# Patient Record
Sex: Male | Born: 1950 | Race: White | Hispanic: No | Marital: Married | Smoking: Never smoker
Health system: Southern US, Community
[De-identification: ages and names within clinical notes are randomized; demographics above are authoritative.]

## PROBLEM LIST (undated history)

## (undated) DIAGNOSIS — E785 Hyperlipidemia, unspecified: Secondary | ICD-10-CM

## (undated) DIAGNOSIS — I351 Nonrheumatic aortic (valve) insufficiency: Secondary | ICD-10-CM

## (undated) DIAGNOSIS — I5189 Other ill-defined heart diseases: Secondary | ICD-10-CM

## (undated) DIAGNOSIS — N200 Calculus of kidney: Secondary | ICD-10-CM

## (undated) DIAGNOSIS — E291 Testicular hypofunction: Secondary | ICD-10-CM

## (undated) DIAGNOSIS — I10 Essential (primary) hypertension: Secondary | ICD-10-CM

## (undated) DIAGNOSIS — I4510 Unspecified right bundle-branch block: Secondary | ICD-10-CM

## (undated) DIAGNOSIS — N183 Chronic kidney disease, stage 3 unspecified: Secondary | ICD-10-CM

## (undated) HISTORY — DX: Calculus of kidney: N20.0

## (undated) HISTORY — DX: Testicular hypofunction: E29.1

## (undated) HISTORY — PX: MOHS SURGERY: SUR867

## (undated) HISTORY — PX: CHOLECYSTECTOMY: SHX55

## (undated) HISTORY — DX: Hyperlipidemia, unspecified: E78.5

---

## 1997-06-01 ENCOUNTER — Ambulatory Visit (HOSPITAL_COMMUNITY): Admission: RE | Admit: 1997-06-01 | Discharge: 1997-06-01 | Payer: Self-pay | Admitting: Family Medicine

## 1999-01-16 ENCOUNTER — Encounter: Payer: Self-pay | Admitting: Emergency Medicine

## 1999-01-16 ENCOUNTER — Emergency Department (HOSPITAL_COMMUNITY): Admission: EM | Admit: 1999-01-16 | Discharge: 1999-01-16 | Payer: Self-pay | Admitting: Emergency Medicine

## 2000-08-10 ENCOUNTER — Encounter (INDEPENDENT_AMBULATORY_CARE_PROVIDER_SITE_OTHER): Payer: Self-pay | Admitting: *Deleted

## 2000-08-10 ENCOUNTER — Ambulatory Visit (HOSPITAL_COMMUNITY): Admission: RE | Admit: 2000-08-10 | Discharge: 2000-08-10 | Payer: Self-pay | Admitting: Gastroenterology

## 2002-02-20 ENCOUNTER — Encounter: Payer: Self-pay | Admitting: Emergency Medicine

## 2002-02-20 ENCOUNTER — Emergency Department (HOSPITAL_COMMUNITY): Admission: EM | Admit: 2002-02-20 | Discharge: 2002-02-20 | Payer: Self-pay | Admitting: Emergency Medicine

## 2002-03-05 ENCOUNTER — Encounter: Payer: Self-pay | Admitting: Family Medicine

## 2002-03-05 ENCOUNTER — Ambulatory Visit (HOSPITAL_COMMUNITY): Admission: RE | Admit: 2002-03-05 | Discharge: 2002-03-05 | Payer: Self-pay | Admitting: Family Medicine

## 2004-01-14 ENCOUNTER — Emergency Department (HOSPITAL_COMMUNITY): Admission: EM | Admit: 2004-01-14 | Discharge: 2004-01-14 | Payer: Self-pay

## 2005-06-11 ENCOUNTER — Ambulatory Visit (HOSPITAL_COMMUNITY): Admission: RE | Admit: 2005-06-11 | Discharge: 2005-06-11 | Payer: Self-pay | Admitting: Surgery

## 2005-06-11 ENCOUNTER — Encounter (INDEPENDENT_AMBULATORY_CARE_PROVIDER_SITE_OTHER): Payer: Self-pay | Admitting: Specialist

## 2007-05-10 ENCOUNTER — Encounter: Admission: RE | Admit: 2007-05-10 | Discharge: 2007-05-25 | Payer: Self-pay | Admitting: Family Medicine

## 2007-08-17 ENCOUNTER — Ambulatory Visit (HOSPITAL_BASED_OUTPATIENT_CLINIC_OR_DEPARTMENT_OTHER): Admission: RE | Admit: 2007-08-17 | Discharge: 2007-08-17 | Payer: Self-pay | Admitting: Orthopedic Surgery

## 2010-06-04 NOTE — Op Note (Signed)
NAMEJUSTICE, MILLIRON            ACCOUNT NO.:  [id number]   MEDICAL RECORD NO.:  [id number]          PATIENT TYPE:  AMB   LOCATION:  DSC                          FACILITY:  [facility]   PHYSICIAN:  Katy Fitch. Sypher, M.D. 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   POSTOPERATIVE DIAGNOSIS:  Chronic stenosing tenosynovitis, right thumb  at A1 pulley with background palmar fibromatosis and mild contracture of  natatory ligament at first web.   OPERATION:  Release of right thumb A1 pulley and incidental dissection  of natatory ligaments at first web.   OPERATING SURGEON:  Katy Fitch. Sypher, MD.   ASSISTANT:  Marveen Reeks. Dasnoit, PA-C.   ANESTHESIA:  Lidocaine 2% local block of right thumb A1 pulley and  flexor sheath.   SUPERVISING ANESTHESIOLOGIST:  None.  This is a minor operating room  case.   ANESTHETIST:  Katy Fitch. 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 Leatrice Jewels for evaluation  and management of a chronic stenosing tenosynovitis of the right thumb.   Mr. Schaller was noted to have background Dupuytren contracture with  involvement of the pretendinous fibers to his right ring finger and a  contracture across the thumb index natatory ligament.  He had a severe  stenosing tenosynovitis of his right thumb flexor pollicis longus at the  A1 pulley with inability to extend the IP joint.  After informed  consent, he is brought to the operating room at this time anticipating  release of his A1 pulley.  We advised incidental release of the natatory  ligament to prevent a rapid progression of his Dupuytren palmar  fibromatosis.   After  informed consent, he is brought to the operating at this time.   PROCEDURE:  Atul Delucia is brought to the operating room and placed  in supine position on the operating table.   Following placement of 2% lidocaine block at metacarpal head level, the  right arm was prepped with Betadine soap solution, sterilely draped.  On  exsanguination of right arm with Esmarch bandage, the arterial  tourniquet was inflated to 230 mmHg.  Procedure commenced with a short  transverse incision extended towards the thumb at its web space to allow  partial release of the natatory ligament.   Subcutaneous tissues were carefully dissected.  There was a significant  increase in the amount of subcutaneous fibromatosis and would typically  be encountered with trigger release.  The natatory ligament was released  with scissors and the subcutaneous fascial bands were released with  scissors.  The A1 pulley was isolated.  The radial proper digital nerve  retracted with blunt retractor and a Glorious Peach used to clear the pulley.  The pulley was thickened to a wall diameter of more than 4 mm.   This was incised with scalpel and scissors.  The flexor pollicis longus  tendon was delivered and had a marked hourglass deformity were it had  been trapped beneath the pulley.   Thereafter, Mr. Annalee Genta was able to extend his IP joint to 30 degrees.  He ranged the IP joint.  We noted a partial rupture of his flexor  pollicis longus less than 10% of the diameter of the tendon.  The  margins of this degenerative tendon injury were debrided to a smooth  margin with a micro rongeur.   Thereafter, the wound was repaired with mattress suture of 4-0 nylon.  Compressive dressing was applied with Xeroflo sterile gauze and 1-inch  Coban.  There are no apparent complications.   Mr. Peterka was sent home with prescription for Vicodin 5 mg one p.o.  q. 4-6 h. p.r.n. pain 20 tablets without refill.  We will see him back  for  followup in the office a week for suture removal.  Questions  regarding this particularly invited and answered.  He was reminded that  his palmar fibromatosis may be simulated by an incision and be more  problematic in the next 6-12 months.   Questions were invited and answered in detail.      Katy Fitch Sypher, M.D.  Electronically Signed     RVS/MEDQ  D:  08/17/2007  T:  08/17/2007  Job:  [id number]   cc:   Vikki Ports, M.D.

## 2010-06-07 NOTE — Op Note (Signed)
[place]  Patient:    Daniel Aguirre, Daniel Aguirre                     MRN: [id number] Proc. Date: 08/10/00 Attending:  Petra Kuba, M.D. CC:         Jamesetta Geralds, M.D.   Operative Report  PROCEDURE PERFORMED:  Colonoscopy with biopsy.  ENDOSCOPIST:  Petra Kuba, M.D.  INDICATIONS FOR PROCEDURE:  Family history of colon cancer, colon polyps.  Due for screening.  Consent was signed after risks, benefits, methods, and options were thoroughly discussed in the office.  MEDICATIONS USED:  Demerol 60 mg, Versed 6 mg.  DESCRIPTION OF PROCEDURE:  Rectal inspection was pertinent for tiny external hemorrhoids.  Digital exam was negative.  Video pediatric colonoscope was inserted and easily advanced around the colon to the cecum.  This did not require any abdominal pressure or position changes. The cecum was identified by the appendiceal orifice and ileocecal valve.  The scope was slowly withdrawn.  The prep was adequate.  There was some liquid stool that required washing and suctioning.  On insertion, no obvious abnormality was seen.  On slow withdrawal through the colon, the cecum, ascending and transverse were all normal.  The scope was withdrawn around the left side.  Rare diverticula were seen.  There were three tiny polyps, questionable polyps seen in the descending, sigmoid and rectum, all of which were cold biopsied x 1 or 2 and put in the same container.  No other abnormalities were seen as we slowly withdrew back to the rectum.  Once back in the rectum, the scope was then retroflexed pertinent for some internal hemorrhoids small.  The scope was straightened and readvanced a short ways around the left side of the colon. Air was suctioned, scope removed.  The patient tolerated the procedure well. There was no obvious immediate complication.  ENDOSCOPIC DIAGNOSIS: 1. Tiny internal and external hemorrhoids. 2. Rare left-sided diverticula. 3.  Questionable three tiny polyps, rectal, sigmoid, descending, all cold    biopsied. 4. Otherwise within normal limits to the cecum.  PLAN:  Await pathology.  Would probably recheck colon screening in five years. Happy to see back p.r.n. Otherwise return care to Dr. Tery Sanfilippo for the customary yearly rectals and guaiacs. DD:  08/10/00 TD:  08/10/00 Job: [id number]

## 2010-06-07 NOTE — Op Note (Signed)
NAMEKOLSON, CHOVANEC            ACCOUNT NO.:  [id number]   MEDICAL RECORD NO.:  [id number]          PATIENT TYPE:  AMB   LOCATION:  SDS                          FACILITY:  [facility]   PHYSICIAN:  Wilmon Arms. Corliss Skains, M.D. DATE OF BIRTH:  1950/03/18   DATE OF PROCEDURE:  06/11/2005  DATE OF DISCHARGE:                                 OPERATIVE REPORT   PREOP DIAGNOSIS:  Chronic calculus cholecystitis.   POSTOP DIAGNOSIS:  Chronic calculus cholecystitis.   PROCEDURE PERFORMED:  Laparoscopic cholecystectomy with intraoperative  cholangiogram.   SURGEON:  Wilmon Arms. Corliss Skains, M.D.   ASSISTANT:  Leonie Man, M.D.   ANESTHESIA:  General endotracheal.   INDICATIONS:  The patient is a 60 year old male who presents with recent  episode of severe epigastric and right upper quadrant pain.  He was noted to  have gallstones and a mildly elevated bilirubin.  Since that time the  patient has been minimally symptomatic.  He presented for surgical  evaluation; and we recommended laparoscopic cholecystectomy.   DESCRIPTION OF PROCEDURE:  The patient was brought to the operating room and  placed in the supine position on the operating room table.  After an  adequate level of general endotracheal anesthesia was obtained, the  patient's abdomen was prepped with Betadine and draped in a sterile fashion.  Antibiotics had been given preoperatively; and pneumatic compression hose  were on his legs.  A time-out was taken to ensure the proper patient and  proper procedure.  A transverse incision was made just below his umbilicus.  Dissection was carried down through the subcutaneous tissues using blunt  dissection.  The fascia was grasped with a clamp and elevated.  The fascia  was opened vertically.  The peritoneal cavity was bluntly entered.  The stay  suture of #0 Vicryl was placed in pursestring fashion around the fascial  opening. The Hasson cannula was inserted; and secured with the stay suture.  The  pneumoperitoneum was then obtained by insufflating CO2 and maintaining  maximum pressure of 15 mmHg.   The camera was inserted; and the patient was rotated in reverse  Trendelenburg position.  The scope was inserted and a 10-mm port was placed  in the subxiphoid position.  Two 5-mm ports were placed in the right upper  quadrant.  The gallbladder had some adhesions to its surface.  These were  taken down with cautery.  The cystic duct was identified; and was  circumferentially dissected.  A clip was placed distally and a small opening  was created on the cystic duct.  A Cook cholangiogram catheter was then  inserted through a separate stab incision; and threaded into the cystic  duct.  A cholangiogram was obtained which showed good flow proximally and  distally showing both sides of the intrahepatic biliary tree.  There was  good flow distally into the duodenum.  No filling defects were noted.  The  cholangiogram catheter was then removed.   The cystic duct was ligated with clips and divided.  The cystic artery was  also ligated with clips and divided.  A separate posterior branch was  ligated with  clips.  Cautery was then used to remove the gallbladder from  the liver bed.  The gallbladder was placed in an EndoCatch sac.  The  gallbladder bed was then thoroughly irrigated.  Hemostasis was obtained with  cautery.  The EndoCatch sack, containing the gallbladder was then removed  through the umbilical port.  The ports were removed under direct vision, as  the pneumoperitoneum was released.  The fascial closure of the umbilicus was  then performed with the pursestring suture.  Then 4-0 Monocryl was used to  close the skin and subcuticular fascia.  Steri-Strips and clean dressing  applied.  The patient was extubated and brought to recovery in stable  condition.  All sponge, instrument, and needle counts were correct.      Wilmon Arms. Tsuei, M.D.  Electronically Signed     MKT/MEDQ  D:   06/11/2005  T:  06/11/2005  Job:  [id number]

## 2012-02-18 ENCOUNTER — Other Ambulatory Visit: Payer: Self-pay | Admitting: Surgery

## 2015-09-04 ENCOUNTER — Telehealth: Payer: Self-pay | Admitting: Cardiology

## 2015-09-04 NOTE — Telephone Encounter (Signed)
Received records from [facility] for appointment on 10/05/15 with Dr Antoine PocheHochrein.  [facility] for Dr Hochrein's schedule on 10/05/15.  lp

## 2015-10-04 NOTE — Progress Notes (Signed)
Cardiology Office Note   Date:  10/07/2015   ID:  Daniel SkatesDennis C Bearce, DOB 1950-07-10, MRN [id number]  PCP:   Duane Lopeoss, Alan, MD  Cardiologist:   Rollene RotundaJames Valli Randol, MD   Chief Complaint  Patient presents with  . Heart Murmur      History of Present Illness: Daniel Aguirre is a 65 y.o. male who presents for evaluation of a murmur. He says he's had a heart murmur for quite a while but he has not had any evaluation on this. He denies any current symptoms however. 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 With this high level of activity he denies any cardiac symptoms. He has no chest pressure, neck or arm discomfort. He does notice any palpitations, presyncope or syncope. He's had no PND or orthopnea.   Past Medical History:  Diagnosis Date  . Dyslipidemia   . Heart murmur   . Kidney stones   . Testicular hypofunction     Past Surgical History:  Procedure Laterality Date  . CHOLECYSTECTOMY    . MOHS SURGERY       Current Outpatient Prescriptions  Medication Sig Dispense Refill  . cholestyramine (QUESTRAN) 4 GM/DOSE powder TAKE 1 SCOOP 1-2 TIMES DAILY  12  . diltiazem (CARDIZEM CD) 300 MG 24 hr capsule Take 300 mg by mouth daily.  5  . Multiple Vitamin (MULTIVITAMIN WITH MINERALS) TABS tablet Take 1 tablet by mouth daily.    Marland Kitchen. testosterone (ANDROGEL) 50 MG/5GM (1%) GEL Place 1 g onto the skin daily.  5  . testosterone (TESTIM) 50 MG/5GM (1%) GEL Place 5 g onto the skin daily.     No current facility-administered medications for this visit.     Allergies:   Amlodipine; Lisinopril; and Penicillins    Social History:  The patient  reports that he has never smoked. He does not have any smokeless tobacco history on file.   Family History:  The patient's family history includes AAA (abdominal aortic aneurysm) (age of onset: 6455) in his father; Colon cancer in his mother; Heart attack (age of onset: 2572) in his  brother; Stroke in his father.    ROS:  Please see the history of present illness.   Otherwise, review of systems are positive for none.   All other systems are reviewed and negative.    PHYSICAL EXAM: VS:  BP (!) 160/80 (BP Location: Right Arm, Patient Position: Sitting, Cuff Size: Normal)   Pulse 66   Ht 5\' 8"  (1.727 m)   Wt 180 lb 6.4 oz (81.8 kg)   SpO2 99%   BMI 27.43 kg/m  , BMI Body mass index is 27.43 kg/m. GENERAL:  Well appearing HEENT:  Pupils equal round and reactive, fundi not visualized, oral mucosa unremarkable NECK:  No jugular venous distention, waveform within normal limits, carotid upstroke brisk and symmetric, no bruits, no thyromegaly LYMPHATICS:  No cervical, inguinal adenopathy LUNGS:  Clear to auscultation bilaterally BACK:  No CVA tenderness CHEST:  Unremarkable HEART:  PMI not displaced or sustained,S1 and S2 within normal limits, no S3, no S4, no clicks, no rubs, 3 out of 6 apical systolic murmur slightly increased with the strain phase of Valsalva, radiating slightly out the aortic outflow tract, no diastolic murmurs ABD:  Flat, positive bowel sounds normal in frequency in pitch, no bruits, no rebound, no guarding, no midline pulsatile mass, no hepatomegaly, no splenomegaly EXT:  2 plus pulses throughout, no  edema, no cyanosis no clubbing SKIN:  No rashes no nodules NEURO:  Cranial nerves II through XII grossly intact, motor grossly intact throughout PSYCH:  Cognitively intact, oriented to person place and time    EKG:  EKG is ordered today. The ekg ordered today demonstrates sinus rhythm, rate 66, right bundle branch block, left anterior fascicular block, left ventricular hypertrophy   Recent Labs: No results found for requested labs within last 8760 hours.    Lipid Panel No results found for: CHOL, TRIG, HDL, CHOLHDL, VLDL, LDLCALC, LDLDIRECT    Wt Readings from Last 3 Encounters:  10/05/15 180 lb 6.4 oz (81.8 kg)      Other studies  Reviewed: Additional studies/ records that were reviewed today include: Office records. Review of the above records demonstrates:  Please see elsewhere in the note.     ASSESSMENT AND PLAN:  MURMUR:  He does have a murmur with some dynamic component. I'll check an echocardiogram. Further evaluation will be based on this result.  QUESTION AAA:     the patient does have a questionable slight pulsatile aorta and first degree relative with abdominal aneurysm. He'll be screened.   RBBB:  I don't have an old EKG for comparison. He will get his echo. Likely he'll need no further testing.  HTN:   his blood pressure is slightly elevated. I gave him instruction blood pressure diary.   SLEEPINESS:   He does have daytime sleepiness is in a meeting but otherwise really doesn't have a lot of symptoms of sleep apnea. Has a little bit of snoring. If he has worsening daytime sleepiness he sleep study.   Current medicines are reviewed at length with the patient today.  The patient does not have concerns regarding medicines.  The following changes have been made:  no change  Labs/ tests ordered today include:   Orders Placed This Encounter  Procedures  . EKG 12-Lead  . ECHOCARDIOGRAM COMPLETE     Disposition:   FU with me as needed.      Signed, Rollene Rotunda, MD  10/07/2015 8:15 PM    [facility]

## 2015-10-05 ENCOUNTER — Ambulatory Visit (INDEPENDENT_AMBULATORY_CARE_PROVIDER_SITE_OTHER): Payer: 59 | Admitting: Cardiology

## 2015-10-05 ENCOUNTER — Encounter: Payer: Self-pay | Admitting: Cardiology

## 2015-10-05 ENCOUNTER — Encounter (INDEPENDENT_AMBULATORY_CARE_PROVIDER_SITE_OTHER): Payer: Self-pay

## 2015-10-05 VITALS — BP 160/80 | HR 66 | Ht 68.0 in | Wt 180.4 lb

## 2015-10-05 DIAGNOSIS — I714 Abdominal aortic aneurysm, without rupture, unspecified: Secondary | ICD-10-CM

## 2015-10-05 DIAGNOSIS — R011 Cardiac murmur, unspecified: Secondary | ICD-10-CM | POA: Diagnosis not present

## 2015-10-05 NOTE — Patient Instructions (Signed)
Medication Instructions:  Continue current medications  Labwork: None Ordered  Testing/Procedures: Your physician has requested that you have an echocardiogram. Echocardiography is a painless test that uses sound waves to create images of your heart. It provides your doctor with information about the size and shape of your heart and how well your heart's chambers and valves are working. This procedure takes approximately one hour. There are no restrictions for this procedure.  Follow-Up: Your physician recommends that you schedule a follow-up appointment in: As Needed   Any Other Special Instructions Will Be Listed Below (If Applicable).   If you need a refill on your cardiac medications before your next appointment, please call your pharmacy.   

## 2015-10-07 ENCOUNTER — Encounter: Payer: Self-pay | Admitting: Cardiology

## 2015-10-18 ENCOUNTER — Ambulatory Visit (HOSPITAL_COMMUNITY): Payer: 59 | Attending: Cardiology

## 2015-10-18 ENCOUNTER — Other Ambulatory Visit: Payer: Self-pay

## 2015-10-18 DIAGNOSIS — E785 Hyperlipidemia, unspecified: Secondary | ICD-10-CM | POA: Diagnosis not present

## 2015-10-18 DIAGNOSIS — I351 Nonrheumatic aortic (valve) insufficiency: Secondary | ICD-10-CM | POA: Insufficient documentation

## 2015-10-18 DIAGNOSIS — I77810 Thoracic aortic ectasia: Secondary | ICD-10-CM | POA: Insufficient documentation

## 2015-10-18 DIAGNOSIS — R011 Cardiac murmur, unspecified: Secondary | ICD-10-CM | POA: Diagnosis present

## 2015-10-18 DIAGNOSIS — I517 Cardiomegaly: Secondary | ICD-10-CM | POA: Diagnosis not present

## 2015-10-23 ENCOUNTER — Telehealth: Payer: Self-pay | Admitting: Cardiology

## 2015-10-23 NOTE — Telephone Encounter (Signed)
Pt aware of his echo

## 2015-10-23 NOTE — Telephone Encounter (Signed)
Returning your call. °

## 2017-12-20 ENCOUNTER — Other Ambulatory Visit: Payer: Self-pay

## 2017-12-20 ENCOUNTER — Emergency Department (HOSPITAL_BASED_OUTPATIENT_CLINIC_OR_DEPARTMENT_OTHER): Payer: No Typology Code available for payment source

## 2017-12-20 ENCOUNTER — Observation Stay (HOSPITAL_BASED_OUTPATIENT_CLINIC_OR_DEPARTMENT_OTHER)
Admission: EM | Admit: 2017-12-20 | Discharge: 2017-12-21 | Disposition: A | Discharge status: Home / Self Care | Payer: No Typology Code available for payment source | Attending: Internal Medicine | Admitting: Internal Medicine

## 2017-12-20 ENCOUNTER — Encounter (HOSPITAL_BASED_OUTPATIENT_CLINIC_OR_DEPARTMENT_OTHER): Payer: Self-pay | Admitting: Emergency Medicine

## 2017-12-20 DIAGNOSIS — I129 Hypertensive chronic kidney disease with stage 1 through stage 4 chronic kidney disease, or unspecified chronic kidney disease: Secondary | ICD-10-CM | POA: Insufficient documentation

## 2017-12-20 DIAGNOSIS — R0789 Other chest pain: Secondary | ICD-10-CM

## 2017-12-20 DIAGNOSIS — E785 Hyperlipidemia, unspecified: Secondary | ICD-10-CM | POA: Insufficient documentation

## 2017-12-20 DIAGNOSIS — I4891 Unspecified atrial fibrillation: Principal | ICD-10-CM | POA: Insufficient documentation

## 2017-12-20 DIAGNOSIS — R079 Chest pain, unspecified: Secondary | ICD-10-CM

## 2017-12-20 DIAGNOSIS — N183 Chronic kidney disease, stage 3 unspecified: Secondary | ICD-10-CM

## 2017-12-20 DIAGNOSIS — I10 Essential (primary) hypertension: Secondary | ICD-10-CM | POA: Diagnosis not present

## 2017-12-20 DIAGNOSIS — I248 Other forms of acute ischemic heart disease: Secondary | ICD-10-CM | POA: Diagnosis not present

## 2017-12-20 DIAGNOSIS — Z79899 Other long term (current) drug therapy: Secondary | ICD-10-CM | POA: Insufficient documentation

## 2017-12-20 DIAGNOSIS — N1831 Chronic kidney disease, stage 3a: Secondary | ICD-10-CM

## 2017-12-20 HISTORY — DX: Chronic kidney disease, stage 3 (moderate): N18.3

## 2017-12-20 HISTORY — DX: Chronic kidney disease, stage 3 unspecified: N18.30

## 2017-12-20 HISTORY — DX: Unspecified right bundle-branch block: I45.10

## 2017-12-20 HISTORY — DX: Other ill-defined heart diseases: I51.89

## 2017-12-20 HISTORY — DX: Nonrheumatic aortic (valve) insufficiency: I35.1

## 2017-12-20 HISTORY — DX: Essential (primary) hypertension: I10

## 2017-12-20 LAB — CBC
HCT: 49 % (ref 39.0–52.0)
Hemoglobin: 16.5 g/dL (ref 13.0–17.0)
MCH: 30.9 pg (ref 26.0–34.0)
MCHC: 33.7 g/dL (ref 30.0–36.0)
MCV: 91.8 fL (ref 80.0–100.0)
Platelets: 202 10*3/uL (ref 150–400)
RBC: 5.34 MIL/uL (ref 4.22–5.81)
RDW: 13.1 % (ref 11.5–15.5)
WBC: 7.7 10*3/uL (ref 4.0–10.5)
nRBC: 0 % (ref 0.0–0.2)

## 2017-12-20 LAB — BASIC METABOLIC PANEL
Anion gap: 7 (ref 5–15)
BUN: 21 mg/dL (ref 8–23)
CALCIUM: 9 mg/dL (ref 8.9–10.3)
CO2: 25 mmol/L (ref 22–32)
Chloride: 108 mmol/L (ref 98–111)
Creatinine, Ser: 1.63 mg/dL — ABNORMAL HIGH (ref 0.61–1.24)
GFR calc Af Amer: 50 mL/min — ABNORMAL LOW (ref >60)
GFR calc non Af Amer: 44 mL/min — ABNORMAL LOW (ref >60)
GLUCOSE: 114 mg/dL — AB (ref 70–99)
Potassium: 3.9 mmol/L (ref 3.5–5.1)
Sodium: 140 mmol/L (ref 135–145)

## 2017-12-20 LAB — TSH: TSH: 6.545 u[IU]/mL — ABNORMAL HIGH (ref 0.350–4.500)

## 2017-12-20 LAB — TROPONIN I
Troponin I: 0.04 ng/mL (ref <0.03)
Troponin I: 0.04 ng/mL (ref <0.03)

## 2017-12-20 LAB — MAGNESIUM: Magnesium: 2 mg/dL (ref 1.7–2.4)

## 2017-12-20 MED ORDER — ENOXAPARIN SODIUM 80 MG/0.8ML ~~LOC~~ SOLN
1.0000 mg/kg | Freq: Once | SUBCUTANEOUS | Status: AC
  Administered 2017-12-20: 80 mg via SUBCUTANEOUS
  Filled 2017-12-20: qty 0.8

## 2017-12-20 MED ORDER — HEPARIN (PORCINE) 25000 UT/250ML-% IV SOLN
1200.0000 [IU]/h | INTRAVENOUS | Status: DC
  Administered 2017-12-20: 1200 [IU]/h via INTRAVENOUS
  Filled 2017-12-20: qty 250

## 2017-12-20 MED ORDER — DILTIAZEM HCL 30 MG PO TABS
30.0000 mg | ORAL_TABLET | Freq: Once | ORAL | Status: AC
  Administered 2017-12-20: 30 mg via ORAL
  Filled 2017-12-20: qty 1

## 2017-12-20 MED ORDER — HEPARIN (PORCINE) 25000 UT/250ML-% IV SOLN
1000.0000 [IU]/h | INTRAVENOUS | Status: DC
  Administered 2017-12-20: 1100 [IU]/h via INTRAVENOUS
  Filled 2017-12-20: qty 250

## 2017-12-20 MED ORDER — ENOXAPARIN SODIUM 80 MG/0.8ML ~~LOC~~ SOLN: 1.0000 mg/kg | Freq: Once | SUBCUTANEOUS | Status: DC

## 2017-12-20 MED ORDER — DILTIAZEM HCL 30 MG PO TABS: 30.0000 mg | ORAL_TABLET | Freq: Once | ORAL | 0 refills | Status: DC | PRN

## 2017-12-20 MED ORDER — IOPAMIDOL (ISOVUE-370) INJECTION 76%
100.0000 mL | Freq: Once | INTRAVENOUS | Status: AC | PRN
  Administered 2017-12-20: 80 mL via INTRAVENOUS

## 2017-12-20 MED ORDER — METOPROLOL TARTRATE 12.5 MG HALF TABLET
12.5000 mg | ORAL_TABLET | Freq: Two times a day (BID) | ORAL | Status: DC
  Administered 2017-12-20 – 2017-12-21 (×2): 12.5 mg via ORAL
  Filled 2017-12-20 (×2): qty 1

## 2017-12-20 MED ORDER — DILTIAZEM HCL 100 MG IV SOLR
INTRAVENOUS | Status: AC
  Filled 2017-12-20: qty 100

## 2017-12-20 MED ORDER — HEPARIN BOLUS VIA INFUSION
4000.0000 [IU] | Freq: Once | INTRAVENOUS | Status: AC
  Administered 2017-12-20: 4000 [IU] via INTRAVENOUS

## 2017-12-20 MED ORDER — DILTIAZEM HCL 100 MG IV SOLR
5.0000 mg/h | INTRAVENOUS | Status: DC
  Administered 2017-12-20: 5 mg/h via INTRAVENOUS

## 2017-12-20 MED ORDER — RIVAROXABAN 15 MG PO TABS: 15.0000 mg | ORAL_TABLET | Freq: Every day | ORAL | 0 refills | Status: DC

## 2017-12-20 MED ORDER — DILTIAZEM LOAD VIA INFUSION
20.0000 mg | Freq: Once | INTRAVENOUS | Status: AC
  Administered 2017-12-20: 20 mg via INTRAVENOUS
  Filled 2017-12-20: qty 20

## 2017-12-20 MED ORDER — SODIUM CHLORIDE 0.9 % IV SOLN: INTRAVENOUS | Status: DC

## 2017-12-20 MED ORDER — DILTIAZEM HCL ER COATED BEADS 180 MG PO CP24
300.0000 mg | ORAL_CAPSULE | Freq: Every day | ORAL | Status: DC
  Administered 2017-12-20 – 2017-12-21 (×2): 300 mg via ORAL
  Filled 2017-12-20 (×2): qty 1

## 2017-12-20 MED ORDER — ASPIRIN EC 325 MG PO TBEC
325.0000 mg | DELAYED_RELEASE_TABLET | Freq: Every day | ORAL | Status: DC
  Administered 2017-12-20: 325 mg via ORAL
  Filled 2017-12-20: qty 1

## 2017-12-20 NOTE — ED Provider Notes (Addendum)
[facility] EMERGENCY DEPARTMENT Provider Note   CSN: [id number] Arrival date & time: 12/20/17  0534     History   Chief Complaint Chief Complaint  Patient presents with  . Atrial Fibrillation    HPI Daniel Aguirre is a 67 y.o. male.  HPI 67 year old male comes in with chief complaint of palpitations.  Patient has history of kidney stones, heart murmur, hypertension, hyperlipidemia and takes testosterone.  He reports that over the past few weeks he has had about couple of episodes of palpitations that were self resolving.  Last night he woke up with chest palpitations that have been persistent therefore patient decided to come to the ER.  There is no associated chest pain, shortness of breath, dizziness, near syncope.   Patient has no history of PE, DVT.  He takes testosterone as an ointment for the last 4 years.  No other risk factors for PE, DVT.  Patient denies any stimulant use and does not smoke.    Past Medical History:  Diagnosis Date  . Dyslipidemia   . Heart murmur   . Hypertension   . Kidney stones   . Testicular hypofunction     There are no active problems to display for this patient.   Past Surgical History:  Procedure Laterality Date  . CHOLECYSTECTOMY    . MOHS SURGERY          Home Medications    Prior to Admission medications   Medication Sig Start Date End Date Taking? Authorizing Provider  cholestyramine Lanetta Inch) 4 GM/DOSE powder TAKE 1 SCOOP 1-2 TIMES DAILY 07/11/15   [provider]  diltiazem (CARDIZEM CD) 300 MG 24 hr capsule Take 300 mg by mouth daily. 08/24/15   [provider]  diltiazem (CARDIZEM) 30 MG tablet Take 1 tablet (30 mg total) by mouth once as needed for up to 1 dose (palpitations with Heart rate > 100). 12/20/17   Derwood Kaplan, MD  Multiple Vitamin (MULTIVITAMIN WITH MINERALS) TABS tablet Take 1 tablet by mouth daily.    [provider]  Rivaroxaban (XARELTO) 15 MG TABS tablet  Take 1 tablet (15 mg total) by mouth daily with supper. 12/20/17   Derwood Kaplan, MD  testosterone (ANDROGEL) 50 MG/5GM (1%) GEL Place 1 g onto the skin daily. 08/27/15   [provider]  testosterone (TESTIM) 50 MG/5GM (1%) GEL Place 5 g onto the skin daily.    [provider]    Family History Family History  Problem Relation Age of Onset  . Colon cancer Mother   . AAA (abdominal aortic aneurysm) Father 62  . Stroke Father   . Heart attack Brother 39    Social History Social History   Tobacco Use  . Smoking status: Never Smoker  . Smokeless tobacco: Never Used  Substance Use Topics  . Alcohol use: Yes  . Drug use: Never     Allergies   Amlodipine; Lisinopril; and Penicillins   Review of Systems Review of Systems  Constitutional: Positive for activity change.  Respiratory: Negative for chest tightness and shortness of breath.   Cardiovascular: Positive for palpitations. Negative for chest pain.  Gastrointestinal: Negative for nausea and vomiting.  Neurological: Negative for syncope.  All other systems reviewed and are negative.    Physical Exam Updated Vital Signs BP (!) 119/93   Pulse (!) 48   Temp 98.3 F (36.8 C) (Oral)   Resp 15   Ht 5\' 8"  (1.727 m)   Wt 79.4 kg  SpO2 94%   BMI 26.61 kg/m   Physical Exam  Constitutional: He is oriented to person, place, and time. He appears well-developed.  HENT:  Head: Atraumatic.  Eyes: Pupils are equal, round, and reactive to light. EOM are normal.  Neck: Neck supple. No JVD present.  Cardiovascular:  Murmur heard. Irregularly irregular and tachycardia  Pulmonary/Chest: Effort normal. He has no wheezes. He has no rales.  Musculoskeletal: He exhibits no edema or tenderness.  Neurological: He is alert and oriented to person, place, and time.  Skin: Skin is warm.  Nursing note and vitals reviewed.    ED Treatments / Results  Labs (all labs ordered are listed, but only abnormal results are  displayed) Labs Reviewed  BASIC METABOLIC PANEL - Abnormal; Notable for the following components:      Result Value   Glucose, Bld 114 (*)    Creatinine, Ser 1.63 (*)    GFR calc non Af Amer 44 (*)    GFR calc Af Amer 50 (*)    All other components within normal limits  TROPONIN I - Abnormal; Notable for the following components:   Troponin I 0.04 (*)    All other components within normal limits  CBC  MAGNESIUM  TSH    EKG EKG Interpretation  Date/Time:  Sunday December 20 2017 05:41:22 EST Ventricular Rate:  108 PR Interval:    QRS Duration: 146 QT Interval:  345 QTC Calculation: 463 R Axis:   -33 Text Interpretation:  Atrial fibrillation Right bundle branch block Nonspecific ST and T wave abnormality new changes noted Confirmed by Derwood Kaplan ([id number]) on 12/20/2017 6:15:02 AM   Radiology Dg Chest Port 1 View  Result Date: 12/20/2017 CLINICAL DATA:  Awoke with pounding and chest and rapid heart rate. EXAM: PORTABLE CHEST 1 VIEW COMPARISON:  None. FINDINGS: The cardiomediastinal contours are normal. The lungs are clear. Pulmonary vasculature is normal. No consolidation, pleural effusion, or pneumothorax. No acute osseous abnormalities are seen. IMPRESSION: Negative AP view of the chest. Electronically Signed   By: Narda Rutherford M.D.   On: 12/20/2017 06:49    Procedures .Critical Care Performed by: Derwood Kaplan, MD Authorized by: Derwood Kaplan, MD   Critical care provider statement:    Critical care time (minutes):  45   Critical care start time:  12/20/2017 6:00 AM   Critical care end time:  12/20/2017 7:24 AM   Critical care was necessary to treat or prevent imminent or life-threatening deterioration of the following conditions:  Cardiac failure   Critical care was time spent personally by me on the following activities:  Discussions with consultants, evaluation of patient's response to treatment, examination of patient, ordering and performing treatments and  interventions, ordering and review of laboratory studies, ordering and review of radiographic studies, pulse oximetry, re-evaluation of patient's condition, obtaining history from patient or surrogate and review of old charts   (including critical care time)  Medications Ordered in ED Medications  diltiazem (CARDIZEM) 1 mg/mL load via infusion 20 mg (20 mg Intravenous Bolus from Bag 12/20/17 0615)    And  diltiazem (CARDIZEM) 100 mg in dextrose 5 % 100 mL (1 mg/mL) infusion (0 mg/hr Intravenous Paused 12/20/17 0659)  aspirin EC tablet 325 mg (325 mg Oral Given 12/20/17 0614)  enoxaparin (LOVENOX) injection 80 mg (has no administration in time range)  heparin bolus via infusion 4,000 Units (4,000 Units Intravenous Bolus from Bag 12/20/17 0626)  diltiazem (CARDIZEM) tablet 30 mg (30 mg Oral Given 12/20/17 0651)  Initial Impression / Assessment and Plan / ED Course  I have reviewed the triage vital signs and the nursing notes.  Pertinent labs & imaging results that were available during my care of the patient were reviewed by me and considered in my medical decision making (see chart for details).  Clinical Course as of Dec 21 723  Wynelle LinkSun Dec 20, 2017  [id number] Discussed case with Dr. Gilmore LarocheAKHTAR, cardiology. Since patient's heart rate has improved with IV Dilts bolus, he is comfortable with patient being managed as an outpatient.  He recommends that we give patient oral 30 mg diltiazem that he can take if needed.  He needs to continue taking his home dose of diltiazem for blood pressure. We will start patient on Xarelto this evening.  He will get a dose of Lovenox right now.  Heparin will be discontinued.  Repeat troponin pending.  Dr. Rubin PayorPickering will discharge the patient if his heart rate continues to be normal and he does not have rising troponin.   [AN]    Clinical Course User Index [AN] Derwood KaplanNanavati, Sharda Keddy, MD    67 year old male comes in with chief complaint of palpitations.  He is noted to be  new onset A. Fib.  Patient's heart rate is fluctuating between 110 and 140.  It appears that he has had similar episode of palpitations in the past that was self resolving.  Patient is already taking diltiazem for hypertension.  We will start him on diltiazem drip for rate control.  He is not appropriate for cardioversion.  It does not appear that there is underlying thyroid disease, PE or alcohol abuse, anemia, lung disease as a cause.  Patient does have a murmur which might be leading to A. Fib.  Patient is not decompensated at this time. Patient CHADSVASC score is 2 -we will initiate heparin. Anticipate admission.  Final Clinical Impressions(s) / ED Diagnoses   Final diagnoses:  Atrial fibrillation with RVR [facility](HCC)    ED Discharge Orders         Ordered    Amb referral to AFIB Clinic     12/20/17 0604    diltiazem (CARDIZEM) 30 MG tablet  Once PRN     12/20/17 0721    Rivaroxaban (XARELTO) 15 MG TABS tablet  Daily with supper     12/20/17 [id number]           Derwood KaplanNanavati, Jayjay Littles, MD 12/20/17 [id number]    Derwood KaplanNanavati, Taheem Fricke, MD 12/20/17 0725

## 2017-12-20 NOTE — ED Notes (Signed)
Date and time results received: 12/20/17 0956  Test: trp Critical Value:0.04  Name of Provider Notified: Rubin PayorPickering Orders Received? Or Actions Taken?: no orders given

## 2017-12-20 NOTE — Progress Notes (Signed)
ANTICOAGULATION CONSULT NOTE   Pharmacy Consult for heparin Indication: atrial fibrillation  Allergies  Allergen Reactions  . Amlodipine Palpitations  . Lisinopril Other (See Comments)    Elevated  creatinine   . Penicillins Other (See Comments)    Childhood reaction     Patient Measurements: Height: 5\' 8"  (172.7 cm) Weight: 175 lb (79.4 kg) IBW/kg (Calculated) : 68.4  Vital Signs: Temp: 98.2 F (36.8 C) (12/01 1538) Temp Source: Oral (12/01 1538) BP: 124/88 (12/01 1538) Pulse Rate: 86 (12/01 1538)  Medical History: Past Medical History:  Diagnosis Date  . CKD (chronic kidney disease), stage III (HCC)   . Diastolic dysfunction    a. 09/2015 Echo: EF 65-70%, no rwma, Gr1 DD, Mod AI. Asc AO 40mm. Mildly dil Asc Ao.  LA 43mm.  Marland Kitchen. Dyslipidemia   . Heart murmur   . Hypertension   . Kidney stones   . Moderate aortic insufficiency    a. 09/2015 Echo: Mod AI.  Marland Kitchen. Testicular hypofunction       Assessment: 67yo male awoke w/ CP and afib. He was on heparin then was changed to lovenox now to change back to heparin infusion (heparin IV stopped this am and lovenox 80mg  given at ~ 8am)   Goal of Therapy:  Heparin level 0.3-0.7 units/ml Monitor platelets by anticoagulation protocol: Yes   Plan:  -no heparin bolus -restart heparin at 1100 units/hr -Heparin level in 8 hours and daily wth CBC daily  Harland GermanAndrew Sathvik Tiedt, PharmD Clinical Pharmacist **Pharmacist phone directory can now be found on [email or web address] ([email or web address]).  Listed under [facility].

## 2017-12-20 NOTE — Progress Notes (Signed)
ANTICOAGULATION CONSULT NOTE - Initial Consult  Pharmacy Consult for heparin Indication: atrial fibrillation  Allergies  Allergen Reactions  . Amlodipine Palpitations  . Lisinopril Other (See Comments)    Elevated  creatinine   . Penicillins Other (See Comments)    Childhood reaction     Patient Measurements: Height: 5\' 8"  (172.7 cm) Weight: 175 lb (79.4 kg) IBW/kg (Calculated) : 68.4  Vital Signs: Temp: 98.3 F (36.8 C) (12/01 0544) Temp Source: Oral (12/01 0544) BP: 158/93 (12/01 0544) Pulse Rate: 97 (12/01 0544)  Medical History: Past Medical History:  Diagnosis Date  . Dyslipidemia   . Heart murmur   . Hypertension   . Kidney stones   . Testicular hypofunction     Medications:  Home meds: diltiazem, topical testosterone  Assessment: 67yo male awoke w/ pounding in chest and rapid HR, found to be in Afib, per last cards visit in Epic 5055yr ago no h/o Afib; to begin heparin.  Goal of Therapy:  Heparin level 0.3-0.7 units/ml Monitor platelets by anticoagulation protocol: Yes   Plan:  Will give heparin 4000 units IV bolus x1 followed by gtt at 1200 units/hr and monitor heparin levels and CBC  Vernard GamblesVeronda Blaklee Shores, PharmD, BCPS  12/20/2017,6:08 AM

## 2017-12-20 NOTE — ED Provider Notes (Addendum)
  Physical Exam  BP 140/88   Pulse 66   Temp 98.3 F (36.8 C) (Oral)   Resp 13   Ht 5\' 8"  (1.727 m)   Wt 79.4 kg   SpO2 98%   BMI 26.61 kg/m   Physical Exam  ED Course/Procedures   Clinical Course as of Dec 21 1130  Sun Dec 20, 2017  0722 Discussed case with Dr. Gilmore LarocheAKHTAR, cardiology. Since patient's heart rate has improved with IV Dilts bolus, he is comfortable with patient being managed as an outpatient.  He recommends that we give patient oral 30 mg diltiazem that he can take if needed.  He needs to continue taking his home dose of diltiazem for blood pressure. We will start patient on Xarelto this evening.  He will get a dose of Lovenox right now.  Heparin will be discontinued.  Repeat troponin pending.  Dr. Rubin PayorPickering will discharge the patient if his heart rate continues to be normal and he does not have rising troponin.   [AN]    Clinical Course User Index [AN] Derwood KaplanNanavati, Ankit, MD    Procedures  MDM  Received patient in signout.  New onset A. fib.  However likely has had episodes over the last month so not a candidate for immediate cardioversion.  Initial troponin mildly elevated.  Initial plan was to repeat but then developed more chest pain without his rate being is elevated.  Discussed with Dr. Anne FuSkains from cardiology.  Recommends medicine admission.  Discussed with Dr. Margo AyeHall from internal medicine.  [facility] Pain is now somewhat pleuritic with a tightness however and will get CT angiogram.      [name], [name], MD 12/20/17 [phone]   CT angiogram negative.  Patient is now converted back to a sinus rhythm.  Still has some chest tightness.    [name], [name], MD 12/20/17 [phone]

## 2017-12-20 NOTE — ED Triage Notes (Signed)
Pt states that at 0350 he woke up with a pounding in his chest and rapid heart rate. Pt reports several episode of this in the past few weeks.

## 2017-12-20 NOTE — H&P (Signed)
History and Physical    Daniel Aguirre WUJ:[id number] DOB: [birth year] DOA: 12/20/2017  PCP: Daisy Floro, MD   Patient coming from: [facility]  Chief Complaint: Palpitations  HPI: Daniel Aguirre is a 67 y.o. male with medical history significant of hypertension, hyperlipidemia who presented to med [facility] with complaints of intermittent racing of heart since a month. It was bothering him mostly at night. This morning it woke him up from the sleep and he could not bear any more so had to present to the emergency department. When he presented to the emergency department his heart rate was in the range of 140s.  EKG revealed A. fib with RVR.  Patient was never diagnosed with atrial fibrillation in the past.  He was noted to have heart murmur  as per his previous records.  Patient also developed some chest pain in the emergency department while he was there so CT chest was ordered which did not show any acute abnormalities. He described the pain as pressure on his central chest. He was started on Cardizem drip and was sent for admission here.  Cardiology has already been consulted. Patient seen and examined the bedside on the floor.  Currently his heart rate is controlled.  He was also on long-acting Cardizem at home.  Patient denies any chest pain, shortness of breath, abdominal pain, dysuria, nausea, vomiting, fever, chills, melena or hematochezia.During my evaluation he was in NSR and did not have any chest pain.  ED Course: Started on Cardizem drip with improvement in the heart rate.  Given a dose of therapeutic Lovenox for anti-coagulation.    Past Medical History:  Diagnosis Date  . Dyslipidemia   . Heart murmur   . Hypertension   . Kidney stones   . Renal insufficiency   . Testicular hypofunction     Past Surgical History:  Procedure Laterality Date  . CHOLECYSTECTOMY    . MOHS SURGERY       reports that he has never smoked. He has never used smokeless  tobacco. He reports that he drinks alcohol. He reports that he does not use drugs.  Allergies  Allergen Reactions  . Amlodipine Palpitations  . Lisinopril Other (See Comments)    Elevated  creatinine   . Penicillins Other (See Comments)    Childhood reaction     Family History  Problem Relation Age of Onset  . Colon cancer Mother   . AAA (abdominal aortic aneurysm) Father 50  . Stroke Father   . Heart attack Brother 72     Prior to Admission medications   Medication Sig Start Date End Date Taking? Authorizing Provider  cholestyramine Lanetta Inch) 4 GM/DOSE powder TAKE 1 SCOOP 1-2 TIMES DAILY 07/11/15   [provider]  diltiazem (CARDIZEM CD) 300 MG 24 hr capsule Take 300 mg by mouth daily. 08/24/15   [provider]  diltiazem (CARDIZEM) 30 MG tablet Take 1 tablet (30 mg total) by mouth once as needed for up to 1 dose (palpitations with Heart rate > 100). 12/20/17   Derwood Kaplan, MD  Multiple Vitamin (MULTIVITAMIN WITH MINERALS) TABS tablet Take 1 tablet by mouth daily.    [provider]  Rivaroxaban (XARELTO) 15 MG TABS tablet Take 1 tablet (15 mg total) by mouth daily with supper. 12/20/17   Derwood Kaplan, MD  testosterone (ANDROGEL) 50 MG/5GM (1%) GEL Place 1 g onto the skin daily. 08/27/15   [provider]  testosterone (TESTIM) 50 MG/5GM (1%) GEL  Place 5 g onto the skin daily.    [provider]    Physical Exam: Vitals:   12/20/17 1230 12/20/17 1245 12/20/17 1330 12/20/17 1400  BP: 132/90 128/85 138/90 120/82  Pulse: 72 68 82 72  Resp: 10 16 14 13   Temp:      TempSrc:      SpO2: 98% 96% 98% 94%  Weight:      Height:        Constitutional: NAD, calm, comfortable Vitals:   12/20/17 1230 12/20/17 1245 12/20/17 1330 12/20/17 1400  BP: 132/90 128/85 138/90 120/82  Pulse: 72 68 82 72  Resp: 10 16 14 13   Temp:      TempSrc:      SpO2: 98% 96% 98% 94%  Weight:      Height:       Eyes: PERRL, lids and conjunctivae  normal ENMT: Mucous membranes are moist. Posterior pharynx clear of any exudate or lesions.Normal dentition.  Neck: normal, supple, no masses, no thyromegaly Respiratory: clear to auscultation bilaterally, no wheezing, no crackles. Normal respiratory effort. No accessory muscle use.  Cardiovascular: Regular rate and rhythm, no murmurs / rubs / gallops. No extremity edema. 2+ pedal pulses. No carotid bruits.  Abdomen: no tenderness, no masses palpated. No hepatosplenomegaly. Bowel sounds positive.  Musculoskeletal: no clubbing / cyanosis. No joint deformity upper and lower extremities. Good ROM, no contractures. Normal muscle tone.  Skin: no rashes, lesions, ulcers. No induration Neurologic: CN 2-12 grossly intact. Sensation intact, DTR normal. Strength 5/5 in all 4.  Psychiatric: Normal judgment and insight. Alert and oriented x 3. Normal mood.   Foley Catheter:None  Labs on Admission: I have personally reviewed following labs and imaging studies  CBC: Recent Labs  Lab 12/20/17 0604  WBC 7.7  HGB 16.5  HCT 49.0  MCV 91.8  PLT 202   Basic Metabolic Panel: Recent Labs  Lab 12/20/17 0600 12/20/17 0604  NA  --  140  K  --  3.9  CL  --  108  CO2  --  25  GLUCOSE  --  114*  BUN  --  21  CREATININE  --  1.63*  CALCIUM  --  9.0  MG 2.0  --    GFR: Estimated Creatinine Clearance: 43.7 mL/min (A) (by C-G formula based on SCr of 1.63 mg/dL (H)). Liver Function Tests: No results for input(s): AST, ALT, ALKPHOS, BILITOT, PROT, ALBUMIN in the last 168 hours. No results for input(s): LIPASE, AMYLASE in the last 168 hours. No results for input(s): AMMONIA in the last 168 hours. Coagulation Profile: No results for input(s): INR, PROTIME in the last 168 hours. Cardiac Enzymes: Recent Labs  Lab 12/20/17 0604 12/20/17 0900  TROPONINI 0.04* 0.04*   BNP (last 3 results) No results for input(s): PROBNP in the last 8760 hours. HbA1C: No results for input(s): HGBA1C in the last 72  hours. CBG: No results for input(s): GLUCAP in the last 168 hours. Lipid Profile: No results for input(s): CHOL, HDL, LDLCALC, TRIG, CHOLHDL, LDLDIRECT in the last 72 hours. Thyroid Function Tests: Recent Labs    12/20/17 0621  TSH 6.545*   Anemia Panel: No results for input(s): VITAMINB12, FOLATE, FERRITIN, TIBC, IRON, RETICCTPCT in the last 72 hours. Urine analysis: No results found for: COLORURINE, APPEARANCEUR, LABSPEC, PHURINE, GLUCOSEU, HGBUR, BILIRUBINUR, KETONESUR, PROTEINUR, UROBILINOGEN, NITRITE, LEUKOCYTESUR  Radiological Exams on Admission: Ct Angio Chest Pe W And/or Wo Contrast  Result Date: 12/20/2017 CLINICAL DATA:  Chest pain and shortness  of breath. New onset atrial fibrillation. EXAM: CT ANGIOGRAPHY CHEST WITH CONTRAST TECHNIQUE: Multidetector CT imaging of the chest was performed using the standard protocol during bolus administration of intravenous contrast. Multiplanar CT image reconstructions and MIPs were obtained to evaluate the vascular anatomy. CONTRAST:  80mL ISOVUE-370 IOPAMIDOL (ISOVUE-370) INJECTION 76% COMPARISON:  Chest radiograph 12/20/2017. CT abdomen and pelvis 08/09/2009. FINDINGS: Cardiovascular: Pulmonary arterial opacification is adequate without evidence of emboli. The thoracic aorta is normal in caliber. The heart is normal in size. There is no pericardial effusion. Mediastinum/Nodes: No enlarged axillary, mediastinal, or hilar lymph nodes. Grossly unremarkable esophagus and thyroid. Lungs/Pleura: No pleural effusion or pneumothorax. Mild respiratory motion with minimal dependent atelectasis in the lower lobes. Similar appearance of small nodular densities in the right lower lobe compared to the prior abdominal CT with chronic calcification associated with the more inferior density (series 6, image 58). Upper Abdomen: Status post cholecystectomy. Punctate nonobstructing calculus in the upper pole of the left kidney. 2.1 cm left renal cyst. Musculoskeletal:  No acute osseous abnormality or suspicious osseous lesion. Review of the MIP images confirms the above findings. CLINICAL DATA:  Chest pain and shortness of breath. New onset atrial fibrillation. EXAM: CT ANGIOGRAPHY CHEST WITH CONTRAST TECHNIQUE: Multidetector CT imaging of the chest was performed using the standard protocol during bolus administration of intravenous contrast. Multiplanar CT image reconstructions and MIPs were obtained to evaluate the vascular anatomy. CONTRAST:  80mL ISOVUE-370 IOPAMIDOL (ISOVUE-370) INJECTION 76% COMPARISON:  Chest radiograph 12/20/2017. CT abdomen and pelvis 08/09/2009. FINDINGS: Cardiovascular: Pulmonary arterial opacification is adequate without evidence of emboli. The thoracic aorta is normal in caliber. The heart is normal in size. There is no pericardial effusion. Mediastinum/Nodes: No enlarged axillary, mediastinal, or hilar lymph nodes. Grossly unremarkable esophagus and thyroid. Lungs/Pleura: No pleural effusion or pneumothorax. Mild respiratory motion with minimal dependent atelectasis in the lower lobes. Similar appearance of small nodular densities in the right lower lobe compared to the prior abdominal CT with chronic calcification associated with the more inferior density (series 6, image 58). Upper Abdomen: Status post cholecystectomy. Punctate nonobstructing calculus in the upper pole of the left kidney. 2.1 cm left renal cyst. Musculoskeletal: No acute osseous abnormality or suspicious osseous lesion. Review of the MIP images confirms the above findings. IMPRESSION: No evidence of pulmonary emboli or other acute abnormality in the chest. Electronically Signed   By: Sebastian Ache M.D.   On: 12/20/2017 10:28   Dg Chest Port 1 View  Result Date: 12/20/2017 CLINICAL DATA:  Awoke with pounding and chest and rapid heart rate. EXAM: PORTABLE CHEST 1 VIEW COMPARISON:  None. FINDINGS: The cardiomediastinal contours are normal. The lungs are clear. Pulmonary vasculature  is normal. No consolidation, pleural effusion, or pneumothorax. No acute osseous abnormalities are seen. IMPRESSION: Negative AP view of the chest. Electronically Signed   By: Narda Rutherford M.D.   On: 12/20/2017 06:49     Assessment/Plan Principal Problem:   A-fib (HCC) Active Problems:   HTN (hypertension)   HLD (hyperlipidemia)  New onset A. fib with ZOX:WRUEAVWUJ score is 2.  Will benefit with anticoagulation.  He was given a dose of therapeutic Lovenox at [facility].  Cardiology has already been consulted.  We will get an echocardiogram.  I will wait  until cardiology sees him for choice of anticoagulation .  We will continue IV heparin until we start on oral anticoagulation.  Will not continue Lovenox given his creatinine clearance. He has been having symptoms of palpitations  on and off since 1 month so he may need to be an candidate for immediate cardioversion.  Will defer this decision to cardiology.  Abnormal TSH: TSH mildly elevated.  Could be subclinical hypothyroidism.We will check free T3 and T4.  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 Will check BMP tomorrow.  Chest pain: Atypical chest pain.  Most likely associated with palpitations.  CT chest did not reveal any acute abnormalities. Chest pain has resolved at this time.  Troponins very mildly elevated. Did not trend up.  Hypertension: Currently blood pressure stable.  He was on oral long-acting Cardizem at home.  We might resume the long-acting Cardizem after Cardizem drip is stopped completely.  Severity of Illness: The appropriate patient status for this patient is OBSERVATION.    DVT prophylaxis:Heparin Code Status: Full Family Communication: None present at the bedside Consults called: Cardiology     Burnadette PopAmrit Kyandra Mcclaine MD [facility] Pager [phone]  If 7PM-7AM, please contact night-coverage [email or web address] Password [email or web address]  12/20/2017, 3:35 PM

## 2017-12-20 NOTE — Care Management (Signed)
Patient has new onset atrial fibrillation, ED physician decided to watch patient for a while to decide whether patient needs to be discharged or admitted later.  Lorretta HarpXilin Skylan Gift, MD  [facility] Pager [phone]  If 7PM-7AM, please contact night-coverage [email or web address] Password [email or web address] 12/20/2017, 7:00 AM

## 2017-12-20 NOTE — Progress Notes (Signed)
Patient is a 67 year old male with past medical history significant for hypertension and sinus tachycardia who [facility] with complaints of pounding chest intermittently of 1 month duration.  This morning it woke him up out of his sleep.  He presented to the ED for further evaluation.  Upon presentation to the ED patient was found to have heart rate in the 140s twelve-lead EKG revealed A. fib with RVR.  ED physician spoke with cardiology who recommended admission by hospitalist.  [facility].  Patient will be admitted as observation status to telemetry unit.  Cardiology will be consulted.  Heart rate is stable at this time on Cardizem drip.

## 2017-12-20 NOTE — Consult Note (Addendum)
Cardiology Consult    Patient ID: Daniel Aguirre MRN: [id number], DOB/AGE: 1950-02-08   Admit date: 12/20/2017 Date of Consult: 12/20/2017  Primary Physician: Daisy Floro, MD Primary Cardiologist: Rollene Rotunda, MD Requesting Provider: Dr. Joesph July  Patient Profile    Daniel Aguirre is a 67 y.o. male with a history of stage III chronic kidney disease, hypertension, diastolic dysfunction, aortic insufficiency, and hyperlipidemia, who is being seen today for the evaluation of atrial fibrillation and chest pain with mild troponin elevation at the request of Dr. Renford Dills.  Past Medical History   Past Medical History:  Diagnosis Date  . CKD (chronic kidney disease), stage III (HCC)   . Diastolic dysfunction    a. 09/2015 Echo: EF 65-70%, no rwma, Gr1 DD, Mod AI. Asc AO 40mm. Mildly dil Asc Ao.  LA 43mm.  Marland Kitchen Dyslipidemia   . Heart murmur   . Hypertension   . Kidney stones   . Moderate aortic insufficiency    a. 09/2015 Echo: Mod AI.  Marland Kitchen Right bundle branch block   . Testicular hypofunction     Past Surgical History:  Procedure Laterality Date  . CHOLECYSTECTOMY    . MOHS SURGERY       Allergies  Allergies  Allergen Reactions  . Amlodipine Palpitations  . Lisinopril Other (See Comments)    Elevated  creatinine   . Penicillins Other (See Comments)    Childhood reaction     History of Present Illness    67 year old male with the above past medical history including stage III chronic kidney disease, hypertension times a proximally 4 to 5 years, cardiac murmur with moderate aortic insufficiency by echo in 2017, remote nephrolithiasis, and hyperlipidemia.  He lives locally with his wife is an active gentleman, exercising approximately 3 days a week.  He has a long history of a cardiac murmur and was previously evaluated by Dr. Antoine Poche in 2017.  Echocardiogram at that time showed moderate aortic insufficiency and a mildly dilated a sending aorta-40 mm.   Recommendation was made for follow-up in 1 year however, patient says he did not receive that message.  He is remained healthy and feeling well over the past 2 years but approximately 5 to 7 weeks ago, he began to experience intermittent palpitations, typically occurring mid afternoon and then again in the early morning hours, lasting approximately 30 minutes, and resolving spontaneously.  He has not previously experienced chest pain, dyspnea, or presyncope with tachypalpitations.  Early this morning, he awoke with recurrent tachypalpitations.  He got up and checked his blood pressure and this was lower than usual, at 102 systolic.  His heart rate was 106.  He took out his dogs while standing outside, he felt lightheaded.  As result, he alerted his wife and subsequently presented to the med [facility].  There, he was found to be in atrial fibrillation at a rate of 108 with a right bundle branch block (old).  He reported substernal chest discomfort that was worse with taking a deep breath.  He was given a Lovenox subcutaneous injection and given a dose of 30 mg oral diltiazem in addition to his usual home dose of long-acting diltiazem, which he has been on for about 4 years for his blood pressure.  Diltiazem infusion was also started.  CT angiography of the chest was undertaken and was negative for PE.  Troponin was mildly elevated at 0.04.  He subsequently converted to sinus rhythm and intravenous diltiazem was discontinued.  [facility] in the setting of mild troponin elevation.  He is chest pain-free since conversion to sinus rhythm.  Inpatient Medications    Heparin infusion ordered Diltiazem 300 mg daily Received diltiazem 30 mg p.o. x1  Family History    Family History  Problem Relation Age of Onset  . Colon cancer Mother 6077  . AAA (abdominal aortic aneurysm) Father 4575  . Stroke Father   . Heart attack Brother 72       presumed MI - SCD  . Stroke Brother [id number]   He  indicated that his mother is deceased. He indicated that his father is deceased. He indicated that his sister is alive. He indicated that only one of his three brothers is alive. He indicated that his maternal grandmother is deceased. He indicated that his maternal grandfather is deceased. He indicated that his paternal grandmother is deceased. He indicated that his paternal grandfather is deceased.   Social History    Social History   Socioeconomic History  . Marital status: Married    Spouse name: Not on file  . Number of children: 2  . Years of education: Not on file  . Highest education level: Not on file  Occupational History  . Occupation: Cabin crewolice (retired)  Social Needs  . Financial resource strain: Not on file  . Food insecurity:    Worry: Not on file    Inability: Not on file  . Transportation needs:    Medical: Not on file    Non-medical: Not on file  Tobacco Use  . Smoking status: Never Smoker  . Smokeless tobacco: Never Used  Substance and Sexual Activity  . Alcohol use: Yes  . Drug use: Never  . Sexual activity: Yes  Lifestyle  . Physical activity:    Days per week: Not on file    Minutes per session: Not on file  . Stress: Not on file  Relationships  . Social connections:    Talks on phone: Not on file    Gets together: Not on file    Attends religious service: Not on file    Active member of club or organization: Not on file    Attends meetings of clubs or organizations: Not on file    Relationship status: Not on file  . Intimate partner violence:    Fear of current or ex partner: Not on file    Emotionally abused: Not on file    Physically abused: Not on file    Forced sexual activity: Not on file  Other Topics Concern  . Not on file  Social History Narrative  . Not on file     Review of Systems    General:  No chills, fever, night sweats or weight changes.  Cardiovascular:  +++ chest pain - pleuritic component, no dyspnea on exertion, edema,  orthopnea, +++ palpitations, no paroxysmal nocturnal dyspnea. Dermatological: No rash, lesions/masses Respiratory: No cough, dyspnea Urologic: No hematuria, dysuria Abdominal:   No nausea, vomiting, diarrhea, bright red blood per rectum, melena, or hematemesis Neurologic:  No visual changes, wkns, changes in mental status. All other systems reviewed and are otherwise negative except as noted above.  Physical Exam    Blood pressure 124/88, pulse 86, temperature 98.2 F (36.8 C), temperature source Oral, resp. rate 16, height 5\' 8"  (1.727 m), weight 79.4 kg, SpO2 95 %.  General: Pleasant, NAD Psych: Normal affect. Neuro: Alert and oriented X 3. Moves all extremities spontaneously. HEENT: Normal  Neck: Supple  without bruits or JVD. Lungs:  Resp regular and unlabored, CTA. Heart: RRR no s3, s4.  2/6 systolic murmur at the upper sternal borders with 3/6 systolic murmur at the apex.  I do not appreciate a diastolic murmur. Abdomen: Soft, non-tender, non-distended, BS + x 4.  Extremities: No clubbing, cyanosis or edema. DP/PT/Radials 2+ and equal bilaterally.  Labs     Recent Labs    12/20/17 0604 12/20/17 0900  TROPONINI 0.04* 0.04*   Lab Results  Component Value Date   WBC 7.7 12/20/2017   HGB 16.5 12/20/2017   HCT 49.0 12/20/2017   MCV 91.8 12/20/2017   PLT 202 12/20/2017    Recent Labs  Lab 12/20/17 0604  NA 140  K 3.9  CL 108  CO2 25  BUN 21  CREATININE 1.63*  CALCIUM 9.0  GLUCOSE 114*      Radiology Studies    Ct Angio Chest Pe W And/or Wo Contrast  Result Date: 12/20/2017 CLINICAL DATA:  Chest pain and shortness of breath. New onset atrial fibrillation. EXAM: CT ANGIOGRAPHY CHEST WITH CONTRAST TECHNIQUE: Multidetector CT imaging of the chest was performed using the standard protocol during bolus administration of intravenous contrast. Multiplanar CT image reconstructions and MIPs were obtained to evaluate the vascular anatomy. CONTRAST:  80mL ISOVUE-370  IOPAMIDOL (ISOVUE-370) INJECTION 76% COMPARISON:  Chest radiograph 12/20/2017. CT abdomen and pelvis 08/09/2009. FINDINGS: Cardiovascular: Pulmonary arterial opacification is adequate without evidence of emboli. The thoracic aorta is normal in caliber. The heart is normal in size. There is no pericardial effusion. Mediastinum/Nodes: No enlarged axillary, mediastinal, or hilar lymph nodes. Grossly unremarkable esophagus and thyroid. Lungs/Pleura: No pleural effusion or pneumothorax. Mild respiratory motion with minimal dependent atelectasis in the lower lobes. Similar appearance of small nodular densities in the right lower lobe compared to the prior abdominal CT with chronic calcification associated with the more inferior density (series 6, image 58). Upper Abdomen: Status post cholecystectomy. Punctate nonobstructing calculus in the upper pole of the left kidney. 2.1 cm left renal cyst. Musculoskeletal: No acute osseous abnormality or suspicious osseous lesion. Review of the MIP images confirms the above findings. CLINICAL DATA:  Chest pain and shortness of breath. New onset atrial fibrillation. EXAM: CT ANGIOGRAPHY CHEST WITH CONTRAST TECHNIQUE: Multidetector CT imaging of the chest was performed using the standard protocol during bolus administration of intravenous contrast. Multiplanar CT image reconstructions and MIPs were obtained to evaluate the vascular anatomy. CONTRAST:  80mL ISOVUE-370 IOPAMIDOL (ISOVUE-370) INJECTION 76% COMPARISON:  Chest radiograph 12/20/2017. CT abdomen and pelvis 08/09/2009. FINDINGS: Cardiovascular: Pulmonary arterial opacification is adequate without evidence of emboli. The thoracic aorta is normal in caliber. The heart is normal in size. There is no pericardial effusion. Mediastinum/Nodes: No enlarged axillary, mediastinal, or hilar lymph nodes. Grossly unremarkable esophagus and thyroid. Lungs/Pleura: No pleural effusion or pneumothorax. Mild respiratory motion with minimal  dependent atelectasis in the lower lobes. Similar appearance of small nodular densities in the right lower lobe compared to the prior abdominal CT with chronic calcification associated with the more inferior density (series 6, image 58). Upper Abdomen: Status post cholecystectomy. Punctate nonobstructing calculus in the upper pole of the left kidney. 2.1 cm left renal cyst. Musculoskeletal: No acute osseous abnormality or suspicious osseous lesion. Review of the MIP images confirms the above findings. IMPRESSION: No evidence of pulmonary emboli or other acute abnormality in the chest. Electronically Signed   By: Sebastian Ache M.D.   On: 12/20/2017 10:28   Dg Chest [facility] 1 [address]  Result Date: 12/20/2017 CLINICAL DATA:  Awoke with pounding and chest and rapid heart rate. EXAM: PORTABLE CHEST 1 VIEW COMPARISON:  None. FINDINGS: The cardiomediastinal contours are normal. The lungs are clear. Pulmonary vasculature is normal. No consolidation, pleural effusion, or pneumothorax. No acute osseous abnormalities are seen. IMPRESSION: Negative AP view of the chest. Electronically Signed   By: Narda Rutherford M.D.   On: 12/20/2017 06:49    ECG & Cardiac Imaging    Atrial fibrillation, 108, right bundle branch block, left axis - personally reviewed.  Assessment & Plan    1.  Paroxysmal atrial fibrillation: Patient presented to the med Center at [facility] today with a 5 to 7-week history of intermittent tachypalpitations occurring multiple times per week lasting up to 30 minutes and resolving spontaneously.  This morning, he had more persistent symptoms associated with lightheadedness, prompting his presentation.  He was found to be in atrial fibrillation at a rate of 108.  He also had substernal and pleuritic chest discomfort.  CTA of the chest was negative for PE.  He was treated with oral and also intravenous diltiazem with subsequent conversion to sinus rhythm.  He remains in sinus rhythm currently.  CHA2DS2VASc  equals 2.  Troponin was mildly elevated at 0.04.  Suspect demand ischemia.  Electrolytes within normal limits.  TSH mildly elevated at 6.545.  For the time being, agree with IV heparinization so that we can obtain echocardiogram and evaluate LV function.  Provided that LV function is normal and troponin trend remains flat, would recommend switching over to Eliquis.  Continue home dose of diltiazem in the setting of mild troponin elevation, I will add a low-dose beta-blocker.  Watch heart rates.  2.  Elevated troponin: Likely demand ischemia in the setting of rapid A. fib.  He did have some chest discomfort.  CT of the chest was negative for PE.  In the setting of intravenous contrast, difficult to ascertain whether or not coronary calcium present.  Provided that echo shows normal LV function without wall motion abnormalities, he will likely be okay for discharge tomorrow we can arrange for an outpatient exercise Myoview to assess for ischemia.  Adding low-dose beta-blocker as above.  Evaluate lipids and have a low threshold to add statin.  3.  Stage III chronic kidney disease: He is unaware of what his baseline creatinine is but says he has been told he has stage III.  Creatinine clearance 43.7 today with a creatinine of 1.63.  He did receive contrast in the setting of a CTA to rule out PE.  Follow-up creatinine in the a.m.  Despite CKD 3, he would still be appropriate for Eliquis 5 mg twice daily in the setting of #1.  4.  Essential hypertension: He says blood pressures typically run in the 140s at home.  Currently 124/88.  Continue oral diltiazem and will add beta-blocker.  5.  Hyperlipidemia: Follow-up lipids.  Not on statin at home.  6.  Abnl TSH:  Mildly elevated.  ? Sick euthyroid. F/u FT4.  Signed, Nicolasa Ducking, NP 12/20/2017, 5:03 PM  For questions or updates, please contact   Please consult [email or web address] for contact info under Cardiology/STEMI.  67 year old male with h/o  hyperlipidemia, CKD stage III, chronic right bundle branch block, who presented with palpitations that has been going on and off for about 3 months, this morning they were persistent and associated with chest tightness and dizziness.   He was found to be in atrial fibrillation, and started on Cardizem  drip that spontaneously cardioverted him back to sinus rhythm at 10:30 AM.  He is being started on heparin drip, and later will be switched to Eliquis 5 mg p.o. twice daily.  The patient understands the risk and benefits and agrees with that. CHA2DS2VASc equals 2.  Troponin was mildly elevated at 0.04.  Suspect demand ischemia. Since he already got the chest CT with contrast to rule out pulmonary embolism today, and has CKD stage III, we will be able to place coronary CTA but will instead plan for outpatient exercise treadmill stress test. Continue diltiazem and metoprolol.  Tobias Alexander, MD 12/20/2017

## 2017-12-21 ENCOUNTER — Observation Stay (HOSPITAL_BASED_OUTPATIENT_CLINIC_OR_DEPARTMENT_OTHER): Payer: No Typology Code available for payment source

## 2017-12-21 ENCOUNTER — Other Ambulatory Visit: Payer: Self-pay | Admitting: Cardiology

## 2017-12-21 DIAGNOSIS — R0789 Other chest pain: Secondary | ICD-10-CM | POA: Diagnosis not present

## 2017-12-21 DIAGNOSIS — I480 Paroxysmal atrial fibrillation: Secondary | ICD-10-CM

## 2017-12-21 DIAGNOSIS — N183 Chronic kidney disease, stage 3 (moderate): Secondary | ICD-10-CM | POA: Diagnosis not present

## 2017-12-21 DIAGNOSIS — I4891 Unspecified atrial fibrillation: Secondary | ICD-10-CM

## 2017-12-21 DIAGNOSIS — I208 Other forms of angina pectoris: Secondary | ICD-10-CM

## 2017-12-21 DIAGNOSIS — I10 Essential (primary) hypertension: Secondary | ICD-10-CM | POA: Diagnosis not present

## 2017-12-21 LAB — BASIC METABOLIC PANEL
ANION GAP: 5 (ref 5–15)
BUN: 19 mg/dL (ref 8–23)
CO2: 26 mmol/L (ref 22–32)
Calcium: 8.2 mg/dL — ABNORMAL LOW (ref 8.9–10.3)
Chloride: 101 mmol/L (ref 98–111)
Creatinine, Ser: 1.39 mg/dL — ABNORMAL HIGH (ref 0.61–1.24)
GFR calc Af Amer: >60 mL/min (ref >60)
GFR, EST NON AFRICAN AMERICAN: 53 mL/min — AB (ref >60)
Glucose, Bld: 119 mg/dL — ABNORMAL HIGH (ref 70–99)
Potassium: 4 mmol/L (ref 3.5–5.1)
Sodium: 132 mmol/L — ABNORMAL LOW (ref 135–145)

## 2017-12-21 LAB — CBC
HCT: 47.8 % (ref 39.0–52.0)
Hemoglobin: 15.7 g/dL (ref 13.0–17.0)
MCH: 29.7 pg (ref 26.0–34.0)
MCHC: 32.8 g/dL (ref 30.0–36.0)
MCV: 90.5 fL (ref 80.0–100.0)
Platelets: 187 10*3/uL (ref 150–400)
RBC: 5.28 MIL/uL (ref 4.22–5.81)
RDW: 13 % (ref 11.5–15.5)
WBC: 6.5 10*3/uL (ref 4.0–10.5)
nRBC: 0 % (ref 0.0–0.2)

## 2017-12-21 LAB — HEPARIN LEVEL (UNFRACTIONATED)
HEPARIN UNFRACTIONATED: 0.72 [IU]/mL — AB (ref 0.30–0.70)
Heparin Unfractionated: 0.54 IU/mL (ref 0.30–0.70)

## 2017-12-21 LAB — ECHOCARDIOGRAM COMPLETE
Height: 68 in
Weight: 2854.4 oz

## 2017-12-21 LAB — HIV ANTIBODY (ROUTINE TESTING W REFLEX): HIV Screen 4th Generation wRfx: NONREACTIVE

## 2017-12-21 LAB — T4, FREE: Free T4: 0.92 ng/dL (ref 0.82–1.77)

## 2017-12-21 MED ORDER — APIXABAN 5 MG PO TABS
5.0000 mg | ORAL_TABLET | Freq: Two times a day (BID) | ORAL | Status: DC
  Administered 2017-12-21: 5 mg via ORAL
  Filled 2017-12-21: qty 1

## 2017-12-21 MED ORDER — APIXABAN 5 MG PO TABS: 5.0000 mg | ORAL_TABLET | Freq: Two times a day (BID) | ORAL | 0 refills | Status: DC

## 2017-12-21 MED ORDER — DILTIAZEM HCL ER COATED BEADS 240 MG PO CP24: 240.0000 mg | ORAL_CAPSULE | Freq: Every day | ORAL | Status: DC

## 2017-12-21 MED ORDER — OFF THE BEAT BOOK
Freq: Once | Status: AC
  Administered 2017-12-21: 10:00:00
  Filled 2017-12-21: qty 1

## 2017-12-21 MED ORDER — METOPROLOL TARTRATE 25 MG PO TABS: 12.5000 mg | ORAL_TABLET | Freq: Two times a day (BID) | ORAL | 0 refills | Status: DC

## 2017-12-21 MED ORDER — DILTIAZEM HCL ER COATED BEADS 240 MG PO CP24: 240.0000 mg | ORAL_CAPSULE | Freq: Every day | ORAL | 0 refills | Status: DC

## 2017-12-21 NOTE — Discharge Summary (Signed)
Physician Discharge Summary  Daniel Aguirre JYN:[id number] DOB: [birth year] DOA: 12/20/2017  PCP: Daisy Floro, MD  Admit date: 12/20/2017 Discharge date: 12/21/2017  Admitted From: Home Disposition:  Home  Discharge Condition:Stable CODE STATUS:FULL Diet recommendation: Heart Healthy   Brief/Interim Summary:  Daniel Aguirre is a 67 y.o. male with medical history significant of hypertension, hyperlipidemia who presented to med [facility] with complaints of intermittent racing of heart since a month. It was bothering him mostly at night. This morning it woke him up from the sleep and he could not bear any more so had to present to the emergency department. When he presented to the emergency department his heart rate was in the range of 140s.  EKG revealed A. fib with RVR.  Patient was never diagnosed with atrial fibrillation in the past. He was started on Cardizem drip and was sent for admission here.  Cardiology consulted. Currently his heart rate is controlled.His Echo showed normal left ventricular systolic function, no regional wall motion abnormality.He was started on Eliquis and stable for discharge home today .He will follow-up with cardiology as an outpatient .  Following problems were addressed during his hospitalization:  New onset A. fib with QMV:HQIONGEXB score is 2.  Started on Eliquis.  Also started on metoprolol and will be continued on Cardizem 240 mg daily.  Abnormal TSH: TSH mildly elevated but free T4 normal.  CKD stage 3: H/O CKD stage 3.Baseline creatinine  unavailable.Monitor BMP as an outpatient.  Chest pain: Atypical chest pain.  Most likely associated with palpitations.  CT chest did not reveal any acute abnormalities. Chest pain has resolved at this time.  Troponins very mildly elevated. Did not trend up.  He will follow-up with cardiology as an outpatient for outpatient stress test.  Hypertension: Currently blood pressure stable.  He was on  oral long-acting Cardizem at home.  We will resume the long-acting Cardizem at 240 mg daily.  Discharge Diagnoses:  Principal Problem:   A-fib (HCC) Active Problems:   HTN (hypertension)   CKD (chronic kidney disease) stage 3, GFR 30-59 ml/min (HCC)   Chest pressure    Discharge Instructions  Discharge Instructions    Amb referral to AFIB Clinic   Complete by:  As directed    Diet - low sodium heart healthy   Complete by:  As directed    Discharge instructions   Complete by:  As directed    1) Follow up with cardiology as an outpatient. 2)Take prescribed medications as instructed.   Increase activity slowly   Complete by:  As directed      Allergies as of 12/21/2017      Reactions   Amlodipine Palpitations   Lisinopril Other (See Comments)   Elevated  creatinine    Penicillins Other (See Comments)   Childhood reaction      Medication List    STOP taking these medications   acetaminophen 650 MG CR tablet Commonly known as:  TYLENOL   cholestyramine 4 GM/DOSE powder Commonly known as:  QUESTRAN   multivitamin with minerals Tabs tablet   TESTIM 50 MG/5GM (1%) Gel Generic drug:  testosterone   testosterone 50 MG/5GM (1%) Gel Commonly known as:  ANDROGEL     TAKE these medications   apixaban 5 MG Tabs tablet Commonly known as:  ELIQUIS Take 1 tablet (5 mg total) by mouth 2 (two) times daily.   diltiazem 240 MG 24 hr capsule Commonly known as:  CARDIZEM CD Take 1 capsule (  240 mg total) by mouth daily. Start taking on:  12/22/2017 What changed:    medication strength  how much to take   metoprolol tartrate 25 MG tablet Commonly known as:  LOPRESSOR Take 0.5 tablets (12.5 mg total) by mouth 2 (two) times daily.      Follow-up Information    Rollene Rotunda, MD Follow up on 01/07/2018.   Specialty:  Cardiology Why:  at 10:20 AM  with Dr. Enzo Bi information: [address] [place] [address] [phone]        [facility] Follow up on 12/29/2017.   Specialty:  Cardiology Why:  at 7:15 AM for stress test --nothing to eat or drink 2 hours prior to study and no caffine for 2 days prior and day of study, wear tennis shoes to walk on treadmill.   Contact information: [address] [place] [address] [phone]         Allergies  Allergen Reactions  . Amlodipine Palpitations  . Lisinopril Other (See Comments)    Elevated  creatinine   . Penicillins Other (See Comments)    Childhood reaction     Consultations:  Cardiology   Procedures/Studies: Ct Angio Chest Pe W And/or Wo Contrast  Result Date: 12/20/2017 CLINICAL DATA:  Chest pain and shortness of breath. New onset atrial fibrillation. EXAM: CT ANGIOGRAPHY CHEST WITH CONTRAST TECHNIQUE: Multidetector CT imaging of the chest was performed using the standard protocol during bolus administration of intravenous contrast. Multiplanar CT image reconstructions and MIPs were obtained to evaluate the vascular anatomy. CONTRAST:  80mL ISOVUE-370 IOPAMIDOL (ISOVUE-370) INJECTION 76% COMPARISON:  Chest radiograph 12/20/2017. CT abdomen and pelvis 08/09/2009. FINDINGS: Cardiovascular: Pulmonary arterial opacification is adequate without evidence of emboli. The thoracic aorta is normal in caliber. The heart is normal in size. There is no pericardial effusion. Mediastinum/Nodes: No enlarged axillary, mediastinal, or hilar lymph nodes. Grossly unremarkable esophagus and thyroid. Lungs/Pleura: No pleural effusion or pneumothorax. Mild respiratory motion with minimal dependent atelectasis in the lower lobes. Similar appearance of small nodular densities in the right lower lobe compared to the prior abdominal CT with chronic calcification associated with the more inferior density (series 6, image 58). Upper Abdomen: Status post cholecystectomy. Punctate nonobstructing calculus in the upper pole of the left kidney. 2.1 cm left  renal cyst. Musculoskeletal: No acute osseous abnormality or suspicious osseous lesion. Review of the MIP images confirms the above findings. CLINICAL DATA:  Chest pain and shortness of breath. New onset atrial fibrillation. EXAM: CT ANGIOGRAPHY CHEST WITH CONTRAST TECHNIQUE: Multidetector CT imaging of the chest was performed using the standard protocol during bolus administration of intravenous contrast. Multiplanar CT image reconstructions and MIPs were obtained to evaluate the vascular anatomy. CONTRAST:  80mL ISOVUE-370 IOPAMIDOL (ISOVUE-370) INJECTION 76% COMPARISON:  Chest radiograph 12/20/2017. CT abdomen and pelvis 08/09/2009. FINDINGS: Cardiovascular: Pulmonary arterial opacification is adequate without evidence of emboli. The thoracic aorta is normal in caliber. The heart is normal in size. There is no pericardial effusion. Mediastinum/Nodes: No enlarged axillary, mediastinal, or hilar lymph nodes. Grossly unremarkable esophagus and thyroid. Lungs/Pleura: No pleural effusion or pneumothorax. Mild respiratory motion with minimal dependent atelectasis in the lower lobes. Similar appearance of small nodular densities in the right lower lobe compared to the prior abdominal CT with chronic calcification associated with the more inferior density (series 6, image 58). Upper Abdomen: Status post cholecystectomy. Punctate nonobstructing calculus in the upper pole of the left kidney. 2.1 cm left renal cyst.  Musculoskeletal: No acute osseous abnormality or suspicious osseous lesion. Review of the MIP images confirms the above findings. IMPRESSION: No evidence of pulmonary emboli or other acute abnormality in the chest. Electronically Signed   By: Sebastian AcheAllen  Grady M.D.   On: 12/20/2017 10:28   Dg Chest Port 1 View  Result Date: 12/20/2017 CLINICAL DATA:  Awoke with pounding and chest and rapid heart rate. EXAM: PORTABLE CHEST 1 VIEW COMPARISON:  None. FINDINGS: The cardiomediastinal contours are normal. The lungs are  clear. Pulmonary vasculature is normal. No consolidation, pleural effusion, or pneumothorax. No acute osseous abnormalities are seen. IMPRESSION: Negative AP view of the chest. Electronically Signed   By: Narda RutherfordMelanie  Sanford M.D.   On: 12/20/2017 06:49       Subjective: Patient seen and examined at bedside this morning.  Remains comfortable.  Heart rate is controlled.  No chest pain  Discharge Exam: Vitals:   12/20/17 2144 12/21/17 0611  BP:  140/81  Pulse:  64  Resp:  16  Temp: 97.6 F (36.4 C) 97.8 F (36.6 C)  SpO2:  97%   Vitals:   12/20/17 1538 12/20/17 2139 12/20/17 2144 12/21/17 0611  BP: 124/88 (!) 144/92  140/81  Pulse: 86   64  Resp: 16   16  Temp: 98.2 F (36.8 C)  97.6 F (36.4 C) 97.8 F (36.6 C)  TempSrc: Oral   Oral  SpO2: 95% 98%  97%  Weight:    80.9 kg  Height:        General: Pt is alert, awake, not in acute distress Cardiovascular: RRR, S1/S2 +, no rubs, no gallops Respiratory: CTA bilaterally, no wheezing, no rhonchi Abdominal: Soft, NT, ND, bowel sounds + Extremities: no edema, no cyanosis    The results of significant diagnostics from this hospitalization (including imaging, microbiology, ancillary and laboratory) are listed below for reference.     Microbiology: No results found for this or any previous visit (from the past 240 hour(s)).   Labs: BNP (last 3 results) No results for input(s): BNP in the last 8760 hours. Basic Metabolic Panel: Recent Labs  Lab 12/20/17 0600 12/20/17 0604 12/21/17 0035  NA  --  140 132*  K  --  3.9 4.0  CL  --  108 101  CO2  --  25 26  GLUCOSE  --  114* 119*  BUN  --  21 19  CREATININE  --  1.63* 1.39*  CALCIUM  --  9.0 8.2*  MG 2.0  --   --    Liver Function Tests: No results for input(s): AST, ALT, ALKPHOS, BILITOT, PROT, ALBUMIN in the last 168 hours. No results for input(s): LIPASE, AMYLASE in the last 168 hours. No results for input(s): AMMONIA in the last 168 hours. CBC: Recent Labs  Lab  12/20/17 0604 12/21/17 0035  WBC 7.7 6.5  HGB 16.5 15.7  HCT 49.0 47.8  MCV 91.8 90.5  PLT 202 187   Cardiac Enzymes: Recent Labs  Lab 12/20/17 0604 12/20/17 0900  TROPONINI 0.04* 0.04*   BNP: Invalid input(s): POCBNP CBG: No results for input(s): GLUCAP in the last 168 hours. D-Dimer No results for input(s): DDIMER in the last 72 hours. Hgb A1c No results for input(s): HGBA1C in the last 72 hours. Lipid Profile No results for input(s): CHOL, HDL, LDLCALC, TRIG, CHOLHDL, LDLDIRECT in the last 72 hours. Thyroid function studies Recent Labs    12/20/17 0621  TSH 6.545*   Anemia work up No results for input(s):  VITAMINB12, FOLATE, FERRITIN, TIBC, IRON, RETICCTPCT in the last 72 hours. Urinalysis No results found for: COLORURINE, APPEARANCEUR, LABSPEC, PHURINE, GLUCOSEU, HGBUR, BILIRUBINUR, KETONESUR, PROTEINUR, UROBILINOGEN, NITRITE, LEUKOCYTESUR Sepsis Labs Invalid input(s): PROCALCITONIN,  WBC,  LACTICIDVEN Microbiology No results found for this or any previous visit (from the past 240 hour(s)).  Please note: You were cared for by a hospitalist during your hospital stay. Once you are discharged, your primary care physician will handle any further medical issues. Please note that NO REFILLS for any discharge medications will be authorized once you are discharged, as it is imperative that you return to your primary care physician (or establish a relationship with a primary care physician if you do not have one) for your post hospital discharge needs so that they can reassess your need for medications and monitor your lab values.    Time coordinating discharge: 40 minutes  SIGNED:   Burnadette Pop, MD  [facility] 12/21/2017, 12:56 PM Pager [phone]  If 7PM-7AM, please contact night-coverage [email or web address] Password [email or web address]

## 2017-12-21 NOTE — Progress Notes (Signed)
ANTICOAGULATION CONSULT NOTE   Pharmacy Consult for heparin Indication: atrial fibrillation  Allergies  Allergen Reactions  . Amlodipine Palpitations  . Lisinopril Other (See Comments)    Elevated  creatinine   . Penicillins Other (See Comments)    Childhood reaction     Patient Measurements: Height: 5\' 8"  (172.7 cm) Weight: 178 lb 6.4 oz (80.9 kg) IBW/kg (Calculated) : 68.4  Vital Signs: Temp: 97.8 F (36.6 C) (12/02 0611) Temp Source: Oral (12/02 0611) BP: 140/81 (12/02 0611) Pulse Rate: 64 (12/02 40980611)  Medical History: Past Medical History:  Diagnosis Date  . CKD (chronic kidney disease), stage III (HCC)   . Diastolic dysfunction    a. 09/2015 Echo: EF 65-70%, no rwma, Gr1 DD, Mod AI. Asc AO 40mm. Mildly dil Asc Ao.  LA 43mm.  Marland Kitchen. Dyslipidemia   . Heart murmur   . Hypertension   . Kidney stones   . Moderate aortic insufficiency    a. 09/2015 Echo: Mod AI.  Marland Kitchen. Right bundle branch block   . Testicular hypofunction       Assessment: 67yo male awoke w/ CP and new afib, continuing on heparin. CTA negative for PE. Heparin level therapeutic. CBC stable. No bleed issues documented. Not on anticoagulation PTA.   Goal of Therapy:  Heparin level 0.3-0.7 units/ml Monitor platelets by anticoagulation protocol: Yes   Plan:  Continue heparin at 1000 units/hr Monitor daily heparin level and CBC, s/sx bleeding Plan likely to transition to apixaban  Babs BertinHaley Monetta Lick, PharmD, BCPS Clinical Pharmacist Clinical phone [phone] Please check AMION for all [facility] contact numbers 12/21/2017 9:12 AM

## 2017-12-21 NOTE — Progress Notes (Signed)
.   Progress Note  Patient Name: Daniel Aguirre Date of Encounter: 12/21/2017  Primary Cardiologist: Rollene RotundaJames Hochrein, MD   Subjective   Did have some chest discomfort after conversion, mild pressure.  No pain this AM or SOB.   Inpatient Medications    Scheduled Meds: . diltiazem  300 mg Oral Daily  . metoprolol tartrate  12.5 mg Oral BID   Continuous Infusions: . heparin 1,000 Units/hr (12/21/17 0300)   PRN Meds:    Vital Signs    Vitals:   12/20/17 1538 12/20/17 2139 12/20/17 2144 12/21/17 0611  BP: 124/88 (!) 144/92  140/81  Pulse: 86   64  Resp: 16   16  Temp: 98.2 F (36.8 C)  97.6 F (36.4 C) 97.8 F (36.6 C)  TempSrc: Oral   Oral  SpO2: 95% 98%  97%  Weight:    80.9 kg  Height:        Intake/Output Summary (Last 24 hours) at 12/21/2017 1017 Last data filed at 12/21/2017 0300 Gross per 24 hour  Intake 110.42 ml  Output -  Net 110.42 ml   Filed Weights   12/20/17 0544 12/21/17 0611  Weight: 79.4 kg 80.9 kg    Telemetry    SR - Personally Reviewed  ECG    No new  Will order one in SR - Personally Reviewed  Physical Exam   GEN: No acute distress.   Neck: No JVD Cardiac: RRR, soft murmur, rubs, or gallops.  Respiratory: Clear to auscultation bilaterally. GI: Soft, nontender, non-distended  MS: No edema; No deformity. Neuro:  Nonfocal  Psych: Normal affect   Labs    Chemistry Recent Labs  Lab 12/20/17 0604 12/21/17 0035  NA 140 132*  K 3.9 4.0  CL 108 101  CO2 25 26  GLUCOSE 114* 119*  BUN 21 19  CREATININE 1.63* 1.39*  CALCIUM 9.0 8.2*  GFRNONAA 44* 53*  GFRAA 50* >60  ANIONGAP 7 5     Hematology Recent Labs  Lab 12/20/17 0604 12/21/17 0035  WBC 7.7 6.5  RBC 5.34 5.28  HGB 16.5 15.7  HCT 49.0 47.8  MCV 91.8 90.5  MCH 30.9 29.7  MCHC 33.7 32.8  RDW 13.1 13.0  PLT 202 187    Cardiac Enzymes Recent Labs  Lab 12/20/17 0604 12/20/17 0900  TROPONINI 0.04* 0.04*   No results for input(s): TROPIPOC in the  last 168 hours.   BNPNo results for input(s): BNP, PROBNP in the last 168 hours.   DDimer No results for input(s): DDIMER in the last 168 hours.   Radiology    Ct Angio Chest Pe W And/or Wo Contrast  Result Date: 12/20/2017 CLINICAL DATA:  Chest pain and shortness of breath. New onset atrial fibrillation. EXAM: CT ANGIOGRAPHY CHEST WITH CONTRAST TECHNIQUE: Multidetector CT imaging of the chest was performed using the standard protocol during bolus administration of intravenous contrast. Multiplanar CT image reconstructions and MIPs were obtained to evaluate the vascular anatomy. CONTRAST:  80mL ISOVUE-370 IOPAMIDOL (ISOVUE-370) INJECTION 76% COMPARISON:  Chest radiograph 12/20/2017. CT abdomen and pelvis 08/09/2009. FINDINGS: Cardiovascular: Pulmonary arterial opacification is adequate without evidence of emboli. The thoracic aorta is normal in caliber. The heart is normal in size. There is no pericardial effusion. Mediastinum/Nodes: No enlarged axillary, mediastinal, or hilar lymph nodes. Grossly unremarkable esophagus and thyroid. Lungs/Pleura: No pleural effusion or pneumothorax. Mild respiratory motion with minimal dependent atelectasis in the lower lobes. Similar appearance of small nodular densities in the right lower lobe  compared to the prior abdominal CT with chronic calcification associated with the more inferior density (series 6, image 58). Upper Abdomen: Status post cholecystectomy. Punctate nonobstructing calculus in the upper pole of the left kidney. 2.1 cm left renal cyst. Musculoskeletal: No acute osseous abnormality or suspicious osseous lesion. Review of the MIP images confirms the above findings. CLINICAL DATA:  Chest pain and shortness of breath. New onset atrial fibrillation. EXAM: CT ANGIOGRAPHY CHEST WITH CONTRAST TECHNIQUE: Multidetector CT imaging of the chest was performed using the standard protocol during bolus administration of intravenous contrast. Multiplanar CT image  reconstructions and MIPs were obtained to evaluate the vascular anatomy. CONTRAST:  80mL ISOVUE-370 IOPAMIDOL (ISOVUE-370) INJECTION 76% COMPARISON:  Chest radiograph 12/20/2017. CT abdomen and pelvis 08/09/2009. FINDINGS: Cardiovascular: Pulmonary arterial opacification is adequate without evidence of emboli. The thoracic aorta is normal in caliber. The heart is normal in size. There is no pericardial effusion. Mediastinum/Nodes: No enlarged axillary, mediastinal, or hilar lymph nodes. Grossly unremarkable esophagus and thyroid. Lungs/Pleura: No pleural effusion or pneumothorax. Mild respiratory motion with minimal dependent atelectasis in the lower lobes. Similar appearance of small nodular densities in the right lower lobe compared to the prior abdominal CT with chronic calcification associated with the more inferior density (series 6, image 58). Upper Abdomen: Status post cholecystectomy. Punctate nonobstructing calculus in the upper pole of the left kidney. 2.1 cm left renal cyst. Musculoskeletal: No acute osseous abnormality or suspicious osseous lesion. Review of the MIP images confirms the above findings. IMPRESSION: No evidence of pulmonary emboli or other acute abnormality in the chest. Electronically Signed   By: Sebastian Ache M.D.   On: 12/20/2017 10:28   Dg Chest Port 1 View  Result Date: 12/20/2017 CLINICAL DATA:  Awoke with pounding and chest and rapid heart rate. EXAM: PORTABLE CHEST 1 VIEW COMPARISON:  None. FINDINGS: The cardiomediastinal contours are normal. The lungs are clear. Pulmonary vasculature is normal. No consolidation, pleural effusion, or pneumothorax. No acute osseous abnormalities are seen. IMPRESSION: Negative AP view of the chest. Electronically Signed   By: Narda Rutherford M.D.   On: 12/20/2017 06:49    Cardiac Studies   Echo pending  Patient Profile     67 y.o. male with a history of stage III chronic kidney disease, hypertension, diastolic dysfunction, aortic  insufficiency, and hyperlipidemia, and now atrial fibrillation and chest pain admitted 12/20/17.   Assessment & Plan    Atrial fib, converted with IV dilt.  Now on po dilt.  IV heparin.  --CHa2DS2Vasc of 2, troponin 0.04- demand ischemia ? Change to DOAC today? --TSH 6.545 and Free T4 0.92  --maintaining SR recheck EKG  Chest pain - with troponin of 0.04  --neg PE   CKD 3 Cr was 1.63 on admit and now 1.39.  RBBB dates back to 2017 by EKGs  Echo pending.  If no WMA then ok to discharge and get outpt nuc possible also with murmur check echo. --? Hold changing heparin to po anticoagulation until echo back.  Systolic Murmur see above.      For questions or updates, please contact [facility] Please consult [email or web address] for contact info under        Signed, Nada Boozer, NP  12/21/2017, 10:17 AM

## 2017-12-21 NOTE — Care Management Note (Signed)
Case Management Note  Patient Details  Name: Fanny SkatesDennis C Wahlberg MRN: [id number] Date of Birth: 1950-12-16  Subjective/Objective:  Pt presented for A fib -transfer from [facility].  Plan for transition home on Eliquis. Benefits Check in process and will make aware of cost once completed. PTA Independent from home.                  Action/Plan: Previous Rx sent to [facility]- for Xarelto- has been filled and co pay is $75.00. CM clarified with MD this am in regards to medication choice and will make patient aware that it will be Eliquis. CM did provide patient with 30 day free Eliquis and $10.00 co pay card. Patient still works. No further needs identified at this time.   Expected Discharge Date:                  Expected Discharge Plan:  Home/Self Care  In-House Referral:  NA  Discharge planning Services  CM Consult, Medication Assistance  Post Acute Care Choice:  NA Choice offered to:  NA  DME Arranged:  N/A DME Agency:  NA  HH Arranged:  NA HH Agency:  NA  Status of Service:  Completed, signed off  If discussed at Long Length of Stay Meetings, dates discussed:    Additional Comments:  Gala LewandowskyGraves-Bigelow, Lareina Espino Kaye, RN 12/21/2017, 10:29 AM

## 2017-12-21 NOTE — Progress Notes (Signed)
  Echocardiogram 2D Echocardiogram has been performed.  Janalyn HarderWest, Caelen Reierson R 12/21/2017, 11:50 AM

## 2017-12-21 NOTE — Progress Notes (Signed)
ANTICOAGULATION CONSULT NOTE - Follow Up Consult  Pharmacy Consult for heparin Indication: atrial fibrillation  Labs: Recent Labs    12/20/17 0604 12/20/17 0900 12/21/17 0035  HGB 16.5  --  15.7  HCT 49.0  --  47.8  PLT 202  --  187  HEPARINUNFRC  --   --  0.72*  CREATININE 1.63*  --  1.39*  TROPONINI 0.04* 0.04*  --     Assessment: 67yo male slightly supratherapeutic on heparin with initial dosing after LMWH for Afib.  Goal of Therapy:  Heparin level 0.3-0.7 units/ml   Plan:  Will decrease heparin gtt by ~1 unit/kg/hr to 1000 units/hr and check level in 8 hours.    Vernard GamblesVeronda General Wearing, PharmD, BCPS  12/21/2017,1:17 AM

## 2017-12-22 ENCOUNTER — Telehealth: Payer: Self-pay | Admitting: Cardiology

## 2017-12-22 LAB — T3, FREE: T3, Free: 3.3 pg/mL (ref 2.0–4.4)

## 2017-12-22 NOTE — Telephone Encounter (Signed)
New Message:  Patient calling because he has some concerns about some  Medication that was stop. He would like to speak to some one concerning the changes. Also his primary Dr. Also wanted him to stop some medication.

## 2017-12-22 NOTE — Telephone Encounter (Signed)
Tylenol is ok no motrin or NSAIDS due to anticoagulation as well, would bleed more easily.  Testim up to Dr. Antoine PocheHochrein.

## 2017-12-22 NOTE — Telephone Encounter (Signed)
Spoke with patient of Dr. Antoine PocheHochrein who was seen in hospital for AF with RVR (spontaneously converted) and then subsequent chest pain. He is concerned about the discharge instructions recommending that he stop Testim, which he has been on for years. 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 He also states he was told at discharge to stop tyelnol which he only uses sparingly, maybe twice per month. He was advised by PCP not to use Motrin, Aleve d/t CKD. He needs advice on this PRN.   Routed to MD/NP who last saw patient in hospital to review & advise

## 2017-12-22 NOTE — Care Management (Signed)
#   7.  S/W   ALEXIS  @  [employer]  RX #  [phone]   ELIQUIS  5 MG BID  AND   ELIQUIS  2.5 MG BID COVER- YES CO-PAY-  $ 30.00  FOR EACH PRRSCRIPTION TIER- NO PRIOR APPROVAL- NO  PREFERRED PHARMACY :  YES  [facility]

## 2017-12-24 ENCOUNTER — Telehealth (HOSPITAL_COMMUNITY): Payer: Self-pay

## 2017-12-24 NOTE — Telephone Encounter (Signed)
He has an appt later this month to discuss.  I think he is going to have more problems with high heart rate if he resumes.  We need to discuss this further in the office.

## 2017-12-24 NOTE — Telephone Encounter (Signed)
Encounter complete. 

## 2017-12-25 NOTE — Telephone Encounter (Addendum)
LMTCB + message sent to patient in MyChart

## 2017-12-29 ENCOUNTER — Ambulatory Visit (HOSPITAL_COMMUNITY)
Admission: RE | Admit: 2017-12-29 | Discharge: 2017-12-29 | Disposition: A | Discharge status: Home / Self Care | Payer: No Typology Code available for payment source | Source: Ambulatory Visit | Attending: Cardiovascular Disease | Admitting: Cardiovascular Disease

## 2017-12-29 DIAGNOSIS — I208 Other forms of angina pectoris: Secondary | ICD-10-CM

## 2017-12-29 LAB — MYOCARDIAL PERFUSION IMAGING
CHL RATE OF PERCEIVED EXERTION: 19
CSEPEW: 13.4 METS
Exercise duration (min): 11 min
Exercise duration (sec): 30 s
LV dias vol: 98 mL (ref 62–150)
LV sys vol: 38 mL
MPHR: 155 {beats}/min
Peak HR: 139 {beats}/min
Percent HR: 89 %
Rest HR: 64 {beats}/min
SDS: 0
SRS: 1
SSS: 1
TID: 0.86

## 2017-12-29 MED ORDER — TECHNETIUM TC 99M TETROFOSMIN IV KIT
10.1000 | PACK | Freq: Once | INTRAVENOUS | Status: AC | PRN
  Administered 2017-12-29: 10.1 via INTRAVENOUS
  Filled 2017-12-29: qty 11

## 2017-12-29 MED ORDER — TECHNETIUM TC 99M TETROFOSMIN IV KIT
31.2000 | PACK | Freq: Once | INTRAVENOUS | Status: AC | PRN
  Administered 2017-12-29: 31.2 via INTRAVENOUS
  Filled 2017-12-29: qty 32

## 2017-12-30 ENCOUNTER — Encounter: Payer: Self-pay | Admitting: Cardiology

## 2018-01-05 NOTE — Progress Notes (Signed)
Cardiology Office Note   Date:  01/07/2018   ID:  Daniel Aguirre, DOB [birth year], MRN [id number]  PCP:  Daisy Floro, MD  Cardiologist:   Rollene Rotunda, MD   Chief Complaint  Patient presents with  . Atrial Fibrillation      History of Present Illness: Daniel Aguirre is a 67 y.o. male who presents for evaluation of chest pain and atrial fib with RVR.  He was recently in the hospital for this.  I reviewed these records for this visit.    He converted spontaneously to sinus rhythm.  Echocardiogram demonstrated well-preserved ejection fraction with some mild aortic insufficiency.  He was sent home on a lower dose of Cardizem but a little beta-blocker was added.  Because his troponin was elevated he was sent for perfusion study which was negative after discharge.  He returns for follow-up.  He has been doing okay since he got out of the hospital.  He is not had any of the sustained tachyarrhythmias but does occasionally get some palpitations that he thinks might be the fibrillation.  These are short-lived.  He has had no presyncope or syncope.  He has had no new shortness of breath, PND or orthopnea.  He was told not to take his testosterone when he was in the hospital and is been very fatigued since not taking this.   Past Medical History:  Diagnosis Date  . CKD (chronic kidney disease), stage III (HCC)   . Diastolic dysfunction    a. 09/2015 Echo: EF 65-70%, no rwma, Gr1 DD, Mod AI. Asc AO 40mm. Mildly dil Asc Ao.  LA 43mm.  Marland Kitchen Dyslipidemia   . Heart murmur   . Hypertension   . Kidney stones   . Moderate aortic insufficiency    a. 09/2015 Echo: Mod AI.  Marland Kitchen Right bundle branch block   . Testicular hypofunction     Past Surgical History:  Procedure Laterality Date  . CHOLECYSTECTOMY    . MOHS SURGERY       Current Outpatient Medications  Medication Sig Dispense Refill  . apixaban (ELIQUIS) 5 MG TABS tablet Take 1 tablet (5 mg total) by mouth 2 (two) times  daily. 60 tablet 0  . diltiazem (CARDIZEM CD) 240 MG 24 hr capsule Take 1 capsule (240 mg total) by mouth daily. 30 capsule 0  . metoprolol tartrate (LOPRESSOR) 25 MG tablet Take 0.5 tablets (12.5 mg total) by mouth 2 (two) times daily. 60 tablet 0   No current facility-administered medications for this visit.     Allergies:   Amlodipine; Lisinopril; and Penicillins    Social History:  The patient  reports that he has never smoked. He has never used smokeless tobacco. He reports current alcohol use. He reports that he does not use drugs.   Family History:  The patient's family history includes AAA (abdominal aortic aneurysm) (age of onset: 48) in his father; Colon cancer (age of onset: 34) in his mother; Heart attack (age of onset: 42) in his brother; Stroke in his father; Stroke (age of onset: 41) in his brother.    ROS:  Please see the history of present illness.   Otherwise, review of systems are positive for none.   All other systems are reviewed and negative.    PHYSICAL EXAM: VS:  BP (!) 156/82   Pulse 62   Ht 5\' 8"  (1.727 m)   Wt 183 lb 12.8 oz (83.4 kg)   BMI 27.95 kg/m  ,  BMI Body mass index is 27.95 kg/m. GENERAL:  Well appearing HEENT:  Pupils equal round and reactive, fundi not visualized, oral mucosa unremarkable NECK:  No jugular venous distention, waveform within normal limits, carotid upstroke brisk and symmetric, no bruits, no thyromegaly LYMPHATICS:  No cervical, inguinal adenopathy LUNGS:  Clear to auscultation bilaterally BACK:  No CVA tenderness CHEST:  Unremarkable HEART:  PMI not displaced or sustained,S1 and S2 within normal limits, no S3, no S4, no clicks, no rubs, very soft intermittent diastolic murmur heard at the left third intercostal space, no systolic  ABD:  Flat, positive bowel sounds normal in frequency in pitch, no bruits, no rebound, no guarding, no midline pulsatile mass, no hepatomegaly, no splenomegaly EXT:  2 plus pulses throughout, no edema,  no cyanosis no clubbing SKIN:  No rashes no nodules NEURO:  Cranial nerves II through XII grossly intact, motor grossly intact throughout PSYCH:  Cognitively intact, oriented to person place and time    EKG:  EKG is ordered today. The ekg ordered today demonstrates sinus rhythm, rate 62, right bundle branch block, left anterior fascicular block   Recent Labs: 12/20/2017: Magnesium 2.0; TSH 6.545 12/21/2017: BUN 19; Creatinine, Ser 1.39; Hemoglobin 15.7; Platelets 187; Potassium 4.0; Sodium 132    Lipid Panel No results found for: CHOL, TRIG, HDL, CHOLHDL, VLDL, LDLCALC, LDLDIRECT    Wt Readings from Last 3 Encounters:  01/07/18 183 lb 12.8 oz (83.4 kg)  12/29/17 178 lb (80.7 kg)  12/21/17 178 lb 6.4 oz (80.9 kg)      Other studies Reviewed: Additional studies/ records that were reviewed today include: Hospital records and echo images persoally reviewed. . Review of the above records demonstrates:  Please see elsewhere in the note.     ASSESSMENT AND PLAN:  ATRIAL FIB:  Mr. Daniel Aguirre has a CHA2DS2 - VASc score of 2.  The patient is going to get an Alive Cor.  We did not capture some of the events to see if he is having paroxysms of A. fib.  He should remain on anticoagulation for now.  Further management will be based on the burden of atrial fibrillation.  ELEVATED TROPONIN: He had negative perfusion study.  Is not having any chest pain.  No further work-up.  HTN: His blood pressure is at acceptable limits at home without him needing systolic of about 140.  He is going to keep his blood pressure diary and I will have to add another agent probably an ARB if he has pressures that are not at target.  STAGE III CKD: Said some mild renal insufficiency.  This is followed by his primary provider.  ABNORMAL EKG: He has right bundle branch block and left anterior fascicular block are unchanged from previous.  He has had no symptomatic bradycardia arrhythmias.  I will be  following this up as below.  ABNORMAL TSH: T3 and T4 were negative.  I reviewed these labs.  No change in therapy.  AI: He has moderate aortic insufficiency.  He has normal LV size and function.  Minna follow this with repeat echocardiograms.  RBBB: As above this is a chronic finding and has had no symptoms.  I will follow this up with repeat imaging in the future.  LOW TESTOSTERONE: I told him it would be reasonable to resume this.     Current medicines are reviewed at length with the patient today.  The patient does not have concerns regarding medicines.  The following changes have been made:  no change  Labs/ tests ordered today include:   Orders Placed This Encounter  Procedures  . EKG 12-Lead     Disposition:   FU with me in 3 months.     Signed, Rollene Rotunda, MD  01/07/2018 12:31 PM    [facility]

## 2018-01-07 ENCOUNTER — Encounter: Payer: Self-pay | Admitting: Cardiology

## 2018-01-07 ENCOUNTER — Ambulatory Visit (INDEPENDENT_AMBULATORY_CARE_PROVIDER_SITE_OTHER): Payer: No Typology Code available for payment source | Admitting: Cardiology

## 2018-01-07 VITALS — BP 156/82 | HR 62 | Ht 68.0 in | Wt 183.8 lb

## 2018-01-07 DIAGNOSIS — N183 Chronic kidney disease, stage 3 unspecified: Secondary | ICD-10-CM

## 2018-01-07 DIAGNOSIS — R9431 Abnormal electrocardiogram [ECG] [EKG]: Secondary | ICD-10-CM

## 2018-01-07 DIAGNOSIS — I351 Nonrheumatic aortic (valve) insufficiency: Secondary | ICD-10-CM

## 2018-01-07 DIAGNOSIS — I480 Paroxysmal atrial fibrillation: Secondary | ICD-10-CM

## 2018-01-07 NOTE — Patient Instructions (Signed)
Medication Instructions:  Your Physician recommend you continue on your current medication as directed.     If you need a refill on your cardiac medications before your next appointment, please call your pharmacy.   Lab work: None  Testing/Procedures: None  Follow-Up: At [facility], you and your health needs are our priority.  As part of our continuing mission to provide you with exceptional heart care, we have created designated Provider Care Teams.  These Care Teams include your primary Cardiologist (physician) and Advanced Practice Providers (APPs -  Physician Assistants and Nurse Practitioners) who all work together to provide you with the care you need, when you need it. You will need a follow up appointment in 3 months.  Please call our office 2 months in advance to schedule this appointment.  You may see Rollene RotundaJames Hochrein, MD or one of the following Advanced Practice Providers on your designated Care Team:   Theodore DemarkRhonda Barrett, PA-C . Joni ReiningKathryn Lawrence, DNP, ANP  Any Other Special Instructions Will Be Listed Below (If Applicable).  Your Doctor recommend you Office managerpurchase Alivecor.

## 2018-01-15 ENCOUNTER — Telehealth: Payer: Self-pay | Admitting: Cardiology

## 2018-01-15 ENCOUNTER — Other Ambulatory Visit: Payer: Self-pay

## 2018-01-15 MED ORDER — APIXABAN 5 MG PO TABS: 5.0000 mg | ORAL_TABLET | Freq: Two times a day (BID) | ORAL | 0 refills | Status: DC

## 2018-01-15 MED ORDER — METOPROLOL TARTRATE 25 MG PO TABS: 12.5000 mg | ORAL_TABLET | Freq: Two times a day (BID) | ORAL | 0 refills | Status: DC

## 2018-01-15 MED ORDER — DILTIAZEM HCL ER COATED BEADS 240 MG PO CP24: 240.0000 mg | ORAL_CAPSULE | Freq: Every day | ORAL | 0 refills | Status: DC

## 2018-01-15 NOTE — Telephone Encounter (Signed)
New message        *STAT* If patient is at the pharmacy, call can be transferred to refill team.   1. Which medications need to be refilled? (please list name of each medication and dose if known)     apixaban (ELIQUIS) 5 MG TABS tablet    diltiazem (CARDIZEM CD) 240 MG 24 hr capsule  metoprolol tartrate (LOPRESSOR) 25 MG tablet     2. Which pharmacy/location (including street and city if local pharmacy) is medication to be sent to? [id number] - [place], [place] -   3. Do they need a 30 day or 90 day supply? 30 days but would like 90 day

## 2018-01-18 ENCOUNTER — Other Ambulatory Visit: Payer: Self-pay

## 2018-01-18 MED ORDER — DILTIAZEM HCL ER COATED BEADS 240 MG PO CP24: 240.0000 mg | ORAL_CAPSULE | Freq: Every day | ORAL | 0 refills | Status: DC

## 2018-01-18 MED ORDER — METOPROLOL TARTRATE 25 MG PO TABS: 12.5000 mg | ORAL_TABLET | Freq: Two times a day (BID) | ORAL | 0 refills | Status: DC

## 2018-04-12 ENCOUNTER — Other Ambulatory Visit: Payer: Self-pay | Admitting: Cardiology

## 2018-04-14 ENCOUNTER — Encounter: Payer: Self-pay | Admitting: *Deleted

## 2018-04-14 ENCOUNTER — Telehealth: Payer: Self-pay | Admitting: Nurse Practitioner

## 2018-04-14 NOTE — Telephone Encounter (Signed)
F/U Message         Patient returned call and is ready for set up for appt tomorrow. Pls call and advise.

## 2018-04-14 NOTE — Telephone Encounter (Signed)
Phone call with patient. Has OV with [facility] tomorrow. He is doing well - he would like to arrange for Virtual Visit.   He has an active MyChart - message to be sent.   Rosalio Macadamia, RN, ANP-C [facility] [address] [place], [place]  [address] [phone]

## 2018-04-15 ENCOUNTER — Encounter: Payer: Self-pay | Admitting: Cardiology

## 2018-04-15 ENCOUNTER — Ambulatory Visit (INDEPENDENT_AMBULATORY_CARE_PROVIDER_SITE_OTHER): Payer: No Typology Code available for payment source | Admitting: Cardiology

## 2018-04-15 VITALS — BP 126/88 | Ht 68.0 in | Wt 183.0 lb

## 2018-04-15 DIAGNOSIS — N182 Chronic kidney disease, stage 2 (mild): Secondary | ICD-10-CM

## 2018-04-15 DIAGNOSIS — I452 Bifascicular block: Secondary | ICD-10-CM

## 2018-04-15 DIAGNOSIS — I129 Hypertensive chronic kidney disease with stage 1 through stage 4 chronic kidney disease, or unspecified chronic kidney disease: Secondary | ICD-10-CM

## 2018-04-15 DIAGNOSIS — R9431 Abnormal electrocardiogram [ECG] [EKG]: Secondary | ICD-10-CM

## 2018-04-15 DIAGNOSIS — N183 Chronic kidney disease, stage 3 unspecified: Secondary | ICD-10-CM

## 2018-04-15 DIAGNOSIS — I480 Paroxysmal atrial fibrillation: Secondary | ICD-10-CM

## 2018-04-15 DIAGNOSIS — Z862 Personal history of diseases of the blood and blood-forming organs and certain disorders involving the immune mechanism: Secondary | ICD-10-CM

## 2018-04-15 DIAGNOSIS — I4891 Unspecified atrial fibrillation: Secondary | ICD-10-CM

## 2018-04-15 DIAGNOSIS — I351 Nonrheumatic aortic (valve) insufficiency: Secondary | ICD-10-CM

## 2018-04-15 NOTE — Progress Notes (Signed)
Virtual Visit via Video Note    Evaluation Performed:  Follow-up visit  This visit type was conducted due to national recommendations for restrictions regarding the COVID-19 Pandemic (e.g. social distancing).  This format is felt to be most appropriate for this patient at this time.  All issues noted in this document were discussed and addressed.  No physical exam was performed (except for noted visual exam findings with Video Visits).  Please refer to the patient's chart (MyChart message for video visits and phone note for telephone visits) for the patient's consent to telehealth for [facility].  Date:  04/15/2018   ID:  Daniel Aguirre, DOB February 02, 1950, MRN [id number]  Patient Location:  [address] [place] [address]   Provider location:   [place], [place]  PCP:  Daisy Floro, MD  Cardiologist:  Rollene Rotunda, MD  Electrophysiologist:  None   Chief Complaint:    Daniel Aguirre is a He returns for follow-up.  He has been doing okay since he got out of the hospital.  He is not had any of the sustained tachyarrhythmias but does occasionally get some palpitations that he thinks might be the fibrillation.  These are short-lived.  He has had no presyncope or syncope.  He has had no new shortness of breath, PND or orthopnea.  He was told not to take his testosterone when he was in the hospital and is been very fatigued since not taking this.   History of Present Illness:    Daniel Aguirre is a 68 y.o. male who presents for evaluation of chest pain and atrial fib with RVR.  He was recently in the hospital for this.  He converted spontaneously to sinus rhythm.  Echocardiogram demonstrated well-preserved ejection fraction with some mild aortic insufficiency.  He was sent home on a lower dose of Cardizem but a little beta-blocker was added.  Because his troponin was elevated he was sent for perfusion study which was negative after discharge.  Has a palpitations do  not last long enough to record.   Since I last saw him he has done well.  The patient denies any new symptoms such as chest discomfort, neck or arm discomfort. There has been no new shortness of breath, PND or orthopnea. There have been no reported palpitations, presyncope or syncope.   He does have an Alive Core device but his symptoms never last long enough to record.   The patient does not symptoms concerning for COVID-19 infection (fever, chills, cough, or new SHORTNESS OF BREATH).   231 chol   Has kardia    Prior CV studies:   The following studies were reviewed today: NA  Past Medical History:  Diagnosis Date  . CKD (chronic kidney disease), stage III (HCC)   . Diastolic dysfunction    a. 09/2015 Echo: EF 65-70%, no rwma, Gr1 DD, Mod AI. Asc AO 40mm. Mildly dil Asc Ao.  LA 43mm.  Marland Kitchen Dyslipidemia   . Heart murmur   . Hypertension   . Kidney stones   . Moderate aortic insufficiency    a. 09/2015 Echo: Mod AI.  Marland Kitchen Right bundle branch block   . Testicular hypofunction    Past Surgical History:  Procedure Laterality Date  . CHOLECYSTECTOMY    . MOHS SURGERY       Current Meds  Medication Sig  . diltiazem (CARDIZEM CD) 240 MG 24 hr capsule Take 1 capsule (240 mg total) by mouth daily.  Marland Kitchen ELIQUIS 5 MG  TABS tablet TAKE 1 TABLET BY MOUTH TWICE A DAY  . metoprolol tartrate (LOPRESSOR) 25 MG tablet Take 0.5 tablets (12.5 mg total) by mouth 2 (two) times daily.  Marland Kitchen testosterone (ANDROGEL) 50 MG/5GM (1%) GEL APPLY 1 TUBE ONCE A DAY AS DIRECTED  . valACYclovir (VALTREX) 1000 MG tablet Take 2 tablets by mouth as needed.     Allergies:   Amlodipine; Lisinopril; and Penicillins   Social History   Tobacco Use  . Smoking status: Never Smoker  . Smokeless tobacco: Never Used  Substance Use Topics  . Alcohol use: Yes  . Drug use: Never     Family Hx: The patient's family history includes AAA (abdominal aortic aneurysm) (age of onset: 104) in his father; Colon cancer (age of onset:  17) in his mother; Heart attack (age of onset: 54) in his brother; Stroke in his father; Stroke (age of onset: 40) in his brother.  ROS:   Please see the history of present illness.    As stated in the HPI and negative for all other systems.   Labs/Other Tests and Data Reviewed:    Recent Labs: 12/20/2017: Magnesium 2.0; TSH 6.545 12/21/2017: BUN 19; Creatinine, Ser 1.39; Hemoglobin 15.7; Platelets 187; Potassium 4.0; Sodium 132   Recent Lipid Panel No results found for: CHOL, TRIG, HDL, CHOLHDL, LDLCALC, LDLDIRECT  Wt Readings from Last 3 Encounters:  04/15/18 183 lb (83 kg)  01/07/18 183 lb 12.8 oz (83.4 kg)  12/29/17 178 lb (80.7 kg)     Exam:    Vital Signs:  BP 126/88   Ht 5\' 8"  (1.727 m)   Wt 183 lb (83 kg)   BMI 27.83 kg/m    Well nourished, well developed male in no acute distress. No other acute findings demonstratable on video.  ASSESSMENT & PLAN:     ATRIAL FIB:  Mr. [name] has a CHA2DS2 - VASc score of 2.   He has had no new symptoms other than brief paroxysms.  No change in therapy.    ELEVATED TROPONIN: He had negative perfusion study.   No further testing.   HTN: His blood pressure is at acceptable as above.  No change in therapy.   STAGE II CKD:   He has had mild stable renal insufficiency.    ABNORMAL EKG: He has right bundle branch block and left anterior fascicular block are unchanged from previous.  No change in therapy.  He understands that he should let me know if he has any presyncope or syncope  AI: He has moderate aortic insufficiency.  He has normal LV size and function.  I will follow this with an echo probably in Dec.   COVID-19 Education: The signs and symptoms of COVID-19 were discussed with the patient and how to seek care for testing (follow up with PCP or arrange E-visit).  The importance of social distancing was discussed today.  Patient Risk:   After full review of this patients clinical status, I feel that they  are at least moderate risk at this time.  Time:   Today, I have spent 15  minutes with the patient with telehealth technology discussing .     Medication Adjustments/Labs and Tests Ordered: Current medicines are reviewed at length with the patient today.  Concerns regarding medicines are outlined above.  Tests Ordered: No orders of the defined types were placed in this encounter.  Medication Changes: No orders of the defined types were placed in this encounter.   Disposition:  Six months  Signed, Rollene Rotunda, MD  04/15/2018 11:06 AM    [facility]

## 2018-04-18 ENCOUNTER — Encounter: Payer: Self-pay | Admitting: Cardiology

## 2018-06-09 DIAGNOSIS — M65341 Trigger finger, right ring finger: Secondary | ICD-10-CM | POA: Insufficient documentation

## 2018-06-09 DIAGNOSIS — M720 Palmar fascial fibromatosis [Dupuytren]: Secondary | ICD-10-CM | POA: Insufficient documentation

## 2018-06-09 HISTORY — DX: Palmar fascial fibromatosis (dupuytren): M72.0

## 2018-10-02 ENCOUNTER — Emergency Department (HOSPITAL_BASED_OUTPATIENT_CLINIC_OR_DEPARTMENT_OTHER): Payer: No Typology Code available for payment source

## 2018-10-02 ENCOUNTER — Emergency Department (HOSPITAL_BASED_OUTPATIENT_CLINIC_OR_DEPARTMENT_OTHER)
Admission: EM | Admit: 2018-10-02 | Discharge: 2018-10-02 | Disposition: A | Discharge status: Home / Self Care | Payer: No Typology Code available for payment source | Attending: Emergency Medicine | Admitting: Emergency Medicine

## 2018-10-02 ENCOUNTER — Other Ambulatory Visit: Payer: Self-pay

## 2018-10-02 ENCOUNTER — Encounter (HOSPITAL_BASED_OUTPATIENT_CLINIC_OR_DEPARTMENT_OTHER): Payer: Self-pay | Admitting: Emergency Medicine

## 2018-10-02 DIAGNOSIS — N131 Hydronephrosis with ureteral stricture, not elsewhere classified: Secondary | ICD-10-CM | POA: Insufficient documentation

## 2018-10-02 DIAGNOSIS — R1031 Right lower quadrant pain: Secondary | ICD-10-CM | POA: Diagnosis present

## 2018-10-02 DIAGNOSIS — R310 Gross hematuria: Secondary | ICD-10-CM | POA: Diagnosis not present

## 2018-10-02 DIAGNOSIS — N200 Calculus of kidney: Secondary | ICD-10-CM

## 2018-10-02 DIAGNOSIS — I129 Hypertensive chronic kidney disease with stage 1 through stage 4 chronic kidney disease, or unspecified chronic kidney disease: Secondary | ICD-10-CM | POA: Diagnosis not present

## 2018-10-02 DIAGNOSIS — N183 Chronic kidney disease, stage 3 (moderate): Secondary | ICD-10-CM | POA: Insufficient documentation

## 2018-10-02 LAB — URINALYSIS, MICROSCOPIC (REFLEX): RBC / HPF: >50 RBC/hpf (ref 0–5)

## 2018-10-02 LAB — URINALYSIS, ROUTINE W REFLEX MICROSCOPIC
Bilirubin Urine: NEGATIVE
Glucose, UA: NEGATIVE mg/dL
Ketones, ur: NEGATIVE mg/dL
Nitrite: NEGATIVE
Protein, ur: NEGATIVE mg/dL
Specific Gravity, Urine: 1.025 (ref 1.005–1.030)
pH: 6.5 (ref 5.0–8.0)

## 2018-10-02 MED ORDER — OXYCODONE-ACETAMINOPHEN 5-325 MG PO TABS: 1.0000 | ORAL_TABLET | Freq: Four times a day (QID) | ORAL | 0 refills | Status: DC | PRN

## 2018-10-02 MED ORDER — TAMSULOSIN HCL 0.4 MG PO CAPS: 0.4000 mg | ORAL_CAPSULE | Freq: Every day | ORAL | 0 refills | Status: AC

## 2018-10-02 NOTE — ED Notes (Signed)
ED Provider at bedside. 

## 2018-10-02 NOTE — ED Provider Notes (Signed)
Emergency Department Provider Note   I have reviewed the triage vital signs and the nursing notes.   HISTORY  Chief Complaint Abdominal Pain and Hematuria   HPI Daniel Aguirre is a 68 y.o. male with past medical history reviewed below presents to the emergency department for evaluation of right flank and abdominal pain with associated hematuria.  Symptoms began today.  Pain was more severe earlier in the day but has since improved significantly.  Patient has had urine frequency and "achy" feelings but no fevers.  He urinated this evening and saw bright red blood.  Denies any urinary retention symptoms.  He had a tele-health visit and was referred to the emergency department for CT imaging.  He does have history of kidney stone but has not required intervention to pass the stones in the past. He is on Eliquis.    Past Medical History:  Diagnosis Date   CKD (chronic kidney disease), stage III (HCC)    Diastolic dysfunction    a. 09/2015 Echo: EF 65-70%, no rwma, Gr1 DD, Mod AI. Asc AO 40mm. Mildly dil Asc Ao.  LA 43mm.   Dyslipidemia    Hypertension    Kidney stones    Moderate aortic insufficiency    a. 09/2015 Echo: Mod AI.   Right bundle branch block    Testicular hypofunction     Patient Active Problem List   Diagnosis Date Noted   Nonrheumatic aortic valve insufficiency 01/07/2018   Abnormal EKG 01/07/2018   Chest pressure    A-fib (HCC) 12/20/2017   HTN (hypertension) 12/20/2017   HLD (hyperlipidemia) 12/20/2017   CKD (chronic kidney disease) stage 3, GFR 30-59 ml/min (HCC) 12/20/2017    Past Surgical History:  Procedure Laterality Date   CHOLECYSTECTOMY     MOHS SURGERY      Allergies Amlodipine, Lisinopril, and Penicillins  Family History  Problem Relation Age of Onset   Colon cancer Mother 7077   AAA (abdominal aortic aneurysm) Father 5075   Stroke Father    Heart attack Brother 2172       presumed MI - SCD   Stroke Brother 1980     Social History Social History   Tobacco Use   Smoking status: Never Smoker   Smokeless tobacco: Never Used  Substance Use Topics   Alcohol use: Yes   Drug use: Never    Review of Systems  Constitutional: No fever/chills Eyes: No visual changes. ENT: No sore throat. Cardiovascular: Denies chest pain. Respiratory: Denies shortness of breath. Gastrointestinal: Positive right flank/abdominal pain.  No nausea, no vomiting.  No diarrhea.  No constipation. Genitourinary: Negative for dysuria. Positive hematuria.  Musculoskeletal: Negative for back pain. Skin: Negative for rash. Neurological: Negative for headaches, focal weakness or numbness.  10-point ROS otherwise negative.  ____________________________________________   PHYSICAL EXAM:  VITAL SIGNS: ED Triage Vitals  Enc Vitals Group     BP 10/02/18 1901 (!) 174/96     Pulse Rate 10/02/18 1901 66     Resp 10/02/18 1901 18     Temp 10/02/18 1901 98.6 F (37 C)     Temp Source 10/02/18 1901 Oral     SpO2 10/02/18 1901 100 %     Weight 10/02/18 1902 177 lb (80.3 kg)     Height 10/02/18 1902 5\' 8"  (1.727 m)   Constitutional: Alert and oriented. Well appearing and in no acute distress. Eyes: Conjunctivae are normal.  Head: Atraumatic. Nose: No congestion/rhinnorhea. Mouth/Throat: Mucous membranes are moist. Neck: No  stridor.   Cardiovascular: Normal rate, regular rhythm. Good peripheral circulation. Grossly normal heart sounds.   Respiratory: Normal respiratory effort.  No retractions. Lungs CTAB. Gastrointestinal: Soft and nontender. No distention.  Musculoskeletal: No lower extremity tenderness nor edema.  Neurologic:  Normal speech and language.  Skin:  Skin is warm, dry and intact. No rash noted.  ____________________________________________   LABS (all labs ordered are listed, but only abnormal results are displayed)  Labs Reviewed  URINALYSIS, ROUTINE W REFLEX MICROSCOPIC - Abnormal; Notable for  the following components:      Result Value   Color, Urine BROWN (*)    APPearance TURBID (*)    Hgb urine dipstick LARGE (*)    Leukocytes,Ua TRACE (*)    All other components within normal limits  URINALYSIS, MICROSCOPIC (REFLEX) - Abnormal; Notable for the following components:   Bacteria, UA RARE (*)    All other components within normal limits  URINE CULTURE    ____________________________________________  RADIOLOGY  Ct Renal Stone Study  Result Date: 10/02/2018 CLINICAL DATA:  Right flank pain, urgency and frequency with hematuria EXAM: CT ABDOMEN AND PELVIS WITHOUT CONTRAST TECHNIQUE: Multidetector CT imaging of the abdomen and pelvis was performed following the standard protocol without IV contrast. COMPARISON:  CT August 09, 2009 FINDINGS: Lower chest: Bandlike area of scarring or atelectasis in lingula. Lung bases are otherwise clear. Normal heart size. No pericardial effusion. Hepatobiliary: No focal liver abnormality is seen. Patient is post cholecystectomy. Slight prominence of the biliary tree likely related to reservoir effect. No calcified intraductal gallstones. Pancreas: Unremarkable. No pancreatic ductal dilatation or surrounding inflammatory changes. Spleen: Normal in size without focal abnormality. Adrenals/Urinary Tract: Normal adrenal glands. Mild right hydroureteronephrosis to the level of a 3 3 mm calculus in the proximal right ureter as it passes anterior to the psoas (2/42 additional nonobstructing calculi present in both kidneys. 2.4 cm partially exophytic fluid attenuation cyst arises from the anterior interpolar region left kidney, probable simple cyst. Mild bilateral symmetric perinephric stranding, a nonspecific finding though may correlate with either age or decreased renal function. Urinary bladder is unremarkable. No visible urethral calcifications. Stomach/Bowel: Distal esophagus, stomach and duodenal sweep are unremarkable. No bowel wall thickening or dilatation.  No evidence of obstruction. A normal appendix is visualized. Scattered colonic diverticula without focal pericolonic inflammation to suggest diverticulitis. Vascular/Lymphatic: No significant vascular findings are present. No enlarged abdominal or pelvic lymph nodes. Reproductive: Coarse eccentric calcification of the prostate. No concerning abnormalities of the prostate or seminal vesicles. Other: No abdominopelvic free fluid or free gas. No bowel containing hernias. Bilateral fat containing inguinal hernias are seen. Musculoskeletal: No acute bony abnormality. Specifically, no fracture, subluxation, or dislocation. Mild dextrocurvature of thoracolumbar spine, apex at L3. Multilevel degenerative changes are present in the imaged portions of the spine. Findings are maximal at L4-5 and L5-S1 with vacuum disc phenomenon and discogenic endplate changes. IMPRESSION: 1. Mild right hydroureteronephrosis to the level of a 3 mm calculus in the proximal right ureter as it passes anterior to the psoas. 2. Additional nonobstructing bilateral nephrolithiasis. 3. Post cholecystectomy. 4. Diverticulosis without evidence of diverticulitis. Electronically Signed   By: Lovena Le M.D.   On: 10/02/2018 19:40    ____________________________________________   PROCEDURES  Procedure(s) performed:   Procedures  None  ____________________________________________   INITIAL IMPRESSION / ASSESSMENT AND PLAN / ED COURSE  Pertinent labs & imaging results that were available during my care of the patient were reviewed by me and considered in my  medical decision making (see chart for details).   Patient presents to the emergency department for evaluation of hematuria with right abdomen and flank pain.  Suspect a kidney stone clinically.  Pain is well controlled at this time without medication.  No urinary retention symptoms to require Foley placement. Plan for CT renal and reassess after UA.   CT shows 3 mm stone on the  right which correlates with patient's symptoms.  No urinary retention symptoms with the hematuria.  Discussed the symptoms as reason for ED return along with fever or worsening pain.  Patient cannot take NSAIDs given his anticoagulation.  Gave contact information for urology.  ____________________________________________  FINAL CLINICAL IMPRESSION(S) / ED DIAGNOSES  Final diagnoses:  Kidney stone  Gross hematuria    NEW OUTPATIENT MEDICATIONS STARTED DURING THIS VISIT:  New Prescriptions   OXYCODONE-ACETAMINOPHEN (PERCOCET/ROXICET) 5-325 MG TABLET    Take 1 tablet by mouth every 6 (six) hours as needed for severe pain.   TAMSULOSIN (FLOMAX) 0.4 MG CAPS CAPSULE    Take 1 capsule (0.4 mg total) by mouth daily for 7 days.    Note:  This document was prepared using Dragon voice recognition software and may include unintentional dictation errors.  Alona Bene, MD Emergency Medicine    Inesha Sow, Arlyss Repress, MD 10/02/18 2034

## 2018-10-02 NOTE — ED Notes (Signed)
Triage delayed, patient in restroom 

## 2018-10-02 NOTE — Discharge Instructions (Signed)
You have been seen in the Emergency Department (ED) today for pain that we believe based on your workup, is caused by kidney stones.  As we have discussed, please drink plenty of fluids.  Please make a follow up appointment with the physician(s) listed elsewhere in this documentation.  You may take pain medication as needed but ONLY as prescribed.  Please also take your prescribed Flomax daily.    Please see your doctor as soon as possible as stones may take 1-3 weeks to pass and you may require additional care or medications.  Do not drink alcohol, drive or participate in any other potentially dangerous activities while taking opiate pain medication as it may make you sleepy. Do not take this medication with any other sedating medications, either prescription or over-the-counter. If you were prescribed Percocet, do not take these with acetaminophen (Tylenol) as it is already contained within these medications.   Take Percocet as needed for severe pain.  This medication is an opiate (or narcotic) pain medication and can be habit forming.  Use it as little as possible to achieve adequate pain control.  Do not use or use it with extreme caution if you have a history of opiate abuse or dependence.  This medication is intended for your use only - do not give any to anyone else and keep it in a secure place where nobody else, especially children, have access to it.  It will also cause or worsen constipation, so you may want to consider taking an over-the-counter stool softener while you are taking this medication.  Return to the Emergency Department (ED) or call your doctor if you have any worsening pain, fever, painful urination, are unable to urinate, or develop other symptoms that concern you.

## 2018-10-02 NOTE — ED Triage Notes (Signed)
Pt co RLQ pain and hematuria that started today. Pt reports hx of kidney stones. Reports feeling "achy", decreased appetite, denies fever. Referred to ED by teledoc

## 2018-10-04 LAB — URINE CULTURE: Culture: <10000 — AB

## 2018-10-06 DIAGNOSIS — N400 Enlarged prostate without lower urinary tract symptoms: Secondary | ICD-10-CM | POA: Diagnosis present

## 2019-09-24 ENCOUNTER — Encounter (HOSPITAL_COMMUNITY): Payer: Self-pay | Admitting: Emergency Medicine

## 2019-09-24 ENCOUNTER — Emergency Department (HOSPITAL_COMMUNITY)
Admission: EM | Admit: 2019-09-24 | Discharge: 2019-09-25 | Disposition: A | Discharge status: Home / Self Care | Payer: No Typology Code available for payment source | Attending: Emergency Medicine | Admitting: Emergency Medicine

## 2019-09-24 ENCOUNTER — Other Ambulatory Visit: Payer: Self-pay

## 2019-09-24 ENCOUNTER — Emergency Department (HOSPITAL_COMMUNITY): Payer: No Typology Code available for payment source

## 2019-09-24 DIAGNOSIS — N183 Chronic kidney disease, stage 3 unspecified: Secondary | ICD-10-CM | POA: Insufficient documentation

## 2019-09-24 DIAGNOSIS — I129 Hypertensive chronic kidney disease with stage 1 through stage 4 chronic kidney disease, or unspecified chronic kidney disease: Secondary | ICD-10-CM | POA: Insufficient documentation

## 2019-09-24 DIAGNOSIS — Z79899 Other long term (current) drug therapy: Secondary | ICD-10-CM | POA: Diagnosis not present

## 2019-09-24 DIAGNOSIS — I4891 Unspecified atrial fibrillation: Secondary | ICD-10-CM | POA: Insufficient documentation

## 2019-09-24 LAB — CBC
HCT: 48.4 % (ref 39.0–52.0)
Hemoglobin: 16.2 g/dL (ref 13.0–17.0)
MCH: 30.8 pg (ref 26.0–34.0)
MCHC: 33.5 g/dL (ref 30.0–36.0)
MCV: 92 fL (ref 80.0–100.0)
Platelets: 212 10*3/uL (ref 150–400)
RBC: 5.26 MIL/uL (ref 4.22–5.81)
RDW: 13.3 % (ref 11.5–15.5)
WBC: 7.8 10*3/uL (ref 4.0–10.5)
nRBC: 0 % (ref 0.0–0.2)

## 2019-09-24 NOTE — ED Provider Notes (Signed)
[facility] Provider Note  CSN: [id number] Arrival date & time: 09/24/19 2205  Chief Complaint(s) A. Fib RVR  HPI Daniel Aguirre is a 69 y.o. male with PAF (CHADSVASC 2) on Eliquis, metop, and diltiazem here for palpitations. 2045pm onset tonight felt fluttering and Kardia showed 145 in Afib. Had some mild chest discomfort. No SOB, dizziness. Noted nausea w/o emesis. No recent fevers or infections.  Palpitations have resolved.  Reports having 1 gin and tonic this evening.   HPI  Past Medical History Past Medical History:  Diagnosis Date   CKD (chronic kidney disease), stage III    Diastolic dysfunction    a. 09/2015 Echo: EF 65-70%, no rwma, Gr1 DD, Mod AI. Asc AO 40mm. Mildly dil Asc Ao.  LA 43mm.   Dyslipidemia    Hypertension    Kidney stones    Moderate aortic insufficiency    a. 09/2015 Echo: Mod AI.   Right bundle branch block    Testicular hypofunction    Patient Active Problem List   Diagnosis Date Noted   Nonrheumatic aortic valve insufficiency 01/07/2018   Abnormal EKG 01/07/2018   Chest pressure    A-fib (HCC) 12/20/2017   HTN (hypertension) 12/20/2017   HLD (hyperlipidemia) 12/20/2017   CKD (chronic kidney disease) stage 3, GFR 30-59 ml/min 12/20/2017   Home Medication(s) Prior to Admission medications   Medication Sig Start Date End Date Taking? Authorizing Provider  diltiazem (CARDIZEM CD) 240 MG 24 hr capsule Take 1 capsule (240 mg total) by mouth daily. 01/18/18   Rollene RotundaHochrein, James, MD  ELIQUIS 5 MG TABS tablet TAKE 1 TABLET BY MOUTH TWICE A DAY 04/12/18   Rollene RotundaHochrein, James, MD  metoprolol tartrate (LOPRESSOR) 25 MG tablet Take 0.5 tablets (12.5 mg total) by mouth 2 (two) times daily. 01/18/18   Rollene RotundaHochrein, James, MD  oxyCODONE-acetaminophen (PERCOCET/ROXICET) 5-325 MG tablet Take 1 tablet by mouth every 6 (six) hours as needed for severe pain. 10/02/18   Long, Arlyss RepressJoshua G, MD  testosterone (ANDROGEL) 50  MG/5GM (1%) GEL APPLY 1 TUBE ONCE A DAY AS DIRECTED 04/08/18   [provider]                                                                                                                                    Past Surgical History Past Surgical History:  Procedure Laterality Date   CHOLECYSTECTOMY     MOHS SURGERY     Family History Family History  Problem Relation Age of Onset   Colon cancer Mother 3877   AAA (abdominal aortic aneurysm) Father 3575   Stroke Father    Heart attack Brother 6772       presumed MI - SCD   Stroke Brother 4680    Social History Social History   Tobacco Use   Smoking status: Never Smoker   Smokeless tobacco: Never Used  Building services engineerVaping Use   Vaping Use:  Never used  Substance Use Topics   Alcohol use: Yes   Drug use: Never   Allergies Amlodipine, Lisinopril, and Penicillins  Review of Systems Review of Systems All other systems are reviewed and are negative for acute change except as noted in the HPI  Physical Exam Vital Signs  I have reviewed the triage vital signs BP (!) 121/98 (BP Location: Left Arm)    Pulse (!) 124    Temp 98.6 F (37 C) (Oral)    Resp 18    SpO2 96%   Physical Exam Vitals reviewed.  Constitutional:      General: He is not in acute distress.    Appearance: He is well-developed. He is not diaphoretic.  HENT:     Head: Normocephalic and atraumatic.     Nose: Nose normal.  Eyes:     General: No scleral icterus.       Right eye: No discharge.        Left eye: No discharge.     Conjunctiva/sclera: Conjunctivae normal.     Pupils: Pupils are equal, round, and reactive to light.  Cardiovascular:     Rate and Rhythm: Tachycardia present. Rhythm irregularly irregular.     Heart sounds: No murmur heard.  No friction rub. No gallop.   Pulmonary:     Effort: Pulmonary effort is normal. No respiratory distress.     Breath sounds: Normal breath sounds. No stridor. No rales.  Abdominal:     General: There is no  distension.     Palpations: Abdomen is soft.     Tenderness: There is no abdominal tenderness.  Musculoskeletal:        General: No tenderness.     Cervical back: Normal range of motion and neck supple.  Skin:    General: Skin is warm and dry.     Findings: No erythema or rash.  Neurological:     Mental Status: He is alert and oriented to person, place, and time.     ED Results and Treatments Labs (all labs ordered are listed, but only abnormal results are displayed) Labs Reviewed  BASIC METABOLIC PANEL - Abnormal; Notable for the following components:      Result Value   Glucose, Bld 110 (*)    Creatinine, Ser 1.99 (*)    GFR calc non Af Amer 34 (*)    GFR calc Af Amer 39 (*)    All other components within normal limits  CBC                                                                                                                         EKG  EKG Interpretation  Date/Time:  Saturday September 24 2019 23:09:33 EDT Ventricular Rate:  120 PR Interval:    QRS Duration: 138 QT Interval:  344 QTC Calculation: 486 R Axis:   -124 Text Interpretation: Atrial fibrillation with rapid ventricular response Right bundle branch block T wave abnormality, consider inferior ischemia Abnormal  ECG Confirmed by Drema Pry [phone]) on 09/24/2019 11:25:33 PM       EKG Interpretation  Date/Time:  Sunday September 25 2019 06:18:55 EDT Ventricular Rate:  71 PR Interval:    QRS Duration: 145 QT Interval:  386 QTC Calculation: 420 R Axis:   -53 Text Interpretation: Sinus rhythm RBBB and LAFB Confirmed by Drema Pry [phone]) on 09/25/2019 6:21:19 AM       Radiology DG Chest 2 View  Result Date: 09/24/2019 CLINICAL DATA:  Chest pain EXAM: CHEST - 2 VIEW COMPARISON:  12/20/2017 FINDINGS: The heart size and mediastinal contours are within normal limits. Both lungs are clear. The visualized skeletal structures are unremarkable. IMPRESSION: No active cardiopulmonary disease.  Electronically Signed   By: Charlett Nose M.D.   On: 09/24/2019 23:35    Pertinent labs & imaging results that were available during my care of the patient were reviewed by me and considered in my medical decision making (see chart for details).  Medications Ordered in ED Medications  sodium chloride 0.9 % bolus 1,000 mL (0 mLs Intravenous Stopped 09/25/19 0117)    Followed by  0.9 %  sodium chloride infusion (1,000 mLs Intravenous New Bag/Given 09/25/19 0118)  metoprolol tartrate (LOPRESSOR) injection 5 mg (5 mg Intravenous Given 09/25/19 0145)  metoprolol tartrate (LOPRESSOR) injection 5 mg (5 mg Intravenous Given 09/25/19 0234)  diltiazem (CARDIZEM) injection 10 mg (10 mg Intravenous Given 09/25/19 0429)                                                                                                                                    Procedures .1-3 Lead EKG Interpretation Performed by: Nira Conn, MD Authorized by: Nira Conn, MD     Interpretation: abnormal     ECG rate:  134   ECG rate assessment: tachycardic     Rhythm: atrial fibrillation     Ectopy: none   .1-3 Lead EKG Interpretation Performed by: Nira Conn, MD Authorized by: Nira Conn, MD     Interpretation: normal     ECG rate:  67   ECG rate assessment: normal     Rhythm: sinus rhythm     Ectopy: none     Conduction: normal   .Critical Care Performed by: Nira Conn, MD Authorized by: Nira Conn, MD    CRITICAL CARE Performed by: Amadeo Garnet [place] Total critical care time: 75 minutes Critical care time was exclusive of separately billable procedures and treating other patients. Critical care was necessary to treat or prevent imminent or life-threatening deterioration. Critical care was time spent personally by me on the following activities: development of treatment plan with patient and/or surrogate as well as nursing, discussions with  consultants, evaluation of patient's response to treatment, examination of patient, obtaining history from patient or surrogate, ordering and performing treatments and interventions, ordering and review of laboratory studies, ordering and review of radiographic  studies, pulse oximetry and re-evaluation of patient's condition.    (including critical care time)  Medical Decision Making / ED Course I have reviewed the nursing notes for this encounter and the patient's prior records (if available in EHR or on provided paperwork).   Daniel Aguirre was evaluated in Emergency Department on 09/25/2019 for the symptoms described in the history of present illness. He was evaluated in the context of the global COVID-19 pandemic, which necessitated consideration that the patient might be at risk for infection with the SARS-CoV-2 virus that causes COVID-19. Institutional protocols and algorithms that pertain to the evaluation of patients at risk for COVID-19 are in a state of rapid change based on information released by regulatory bodies including the CDC and federal and state organizations. These policies and algorithms were followed during the patient's care in the ED.  Patient in AFRVR. Possible dehydration with etoh use. Asymptomatic at this time, but still has rates up to 160s. Unfortunately, patient missed a dose of Eliquis this week, and since is does not feel his dysrhythmia, he is not a candidate for cardioversion at this time.  Given IVF for hydration.  After 1L, his rate would fluctuate from 110s-130s. Given a total of 10mg  of lopressor with mild improvement.   Discussed case with Dr. who recommended 10mg  of Diltiazem. If it fails, admit to medicine for further management. If it works to control rate, we will increase his metop to 25mg  BID.  5:38 AM Patient appears to be rate controlled around 80-90s.   6:18 AM HR to 70s now. Converted to NSR.      Final Clinical Impression(s)  / ED Diagnoses Final diagnoses:  Atrial fibrillation with RVR (HCC)   The patient appears reasonably screened and/or stabilized for discharge and I doubt any other medical condition or other [facility] requiring further screening, evaluation, or treatment in the ED at this time prior to discharge. Safe for discharge with strict return precautions.  Disposition: Discharge  Condition: Good  I have discussed the results, Dx and Tx plan with the patient/family who expressed understanding and agree(s) with the plan. Discharge instructions discussed at length. The patient/family was given strict return precautions who verbalized understanding of the instructions. No further questions at time of discharge.    ED Discharge Orders    None        Follow Up: , MD [address] [place] Rollene Rotunda [address] [phone]  Schedule an appointment as soon as possible for a visit        This chart was dictated using voice recognition software.  Despite best efforts to proofread,  errors can occur which can change the documentation meaning.   [place], MD 09/25/19 [phone]

## 2019-09-24 NOTE — ED Triage Notes (Signed)
Pt presents to ED POV. Pt c/o his heart fluttering. Pt reports that he has been in a. Fib in the past. Home pulse - 145. Pt denies any pain.

## 2019-09-25 DIAGNOSIS — I4891 Unspecified atrial fibrillation: Secondary | ICD-10-CM | POA: Diagnosis not present

## 2019-09-25 LAB — BASIC METABOLIC PANEL
Anion gap: 11 (ref 5–15)
BUN: 21 mg/dL (ref 8–23)
CO2: 24 mmol/L (ref 22–32)
Calcium: 9.5 mg/dL (ref 8.9–10.3)
Chloride: 106 mmol/L (ref 98–111)
Creatinine, Ser: 1.99 mg/dL — ABNORMAL HIGH (ref 0.61–1.24)
GFR calc Af Amer: 39 mL/min — ABNORMAL LOW (ref >60)
GFR calc non Af Amer: 34 mL/min — ABNORMAL LOW (ref >60)
Glucose, Bld: 110 mg/dL — ABNORMAL HIGH (ref 70–99)
Potassium: 4.2 mmol/L (ref 3.5–5.1)
Sodium: 141 mmol/L (ref 135–145)

## 2019-09-25 MED ORDER — METOPROLOL TARTRATE 5 MG/5ML IV SOLN
5.0000 mg | Freq: Once | INTRAVENOUS | Status: AC
  Administered 2019-09-25: 5 mg via INTRAVENOUS
  Filled 2019-09-25: qty 5

## 2019-09-25 MED ORDER — SODIUM CHLORIDE 0.9 % IV SOLN
1000.0000 mL | INTRAVENOUS | Status: DC
  Administered 2019-09-25: 1000 mL via INTRAVENOUS

## 2019-09-25 MED ORDER — METOPROLOL TARTRATE 25 MG PO TABS: 25.0000 mg | ORAL_TABLET | Freq: Once | ORAL | Status: DC

## 2019-09-25 MED ORDER — SODIUM CHLORIDE 0.9 % IV BOLUS (SEPSIS)
1000.0000 mL | Freq: Once | INTRAVENOUS | Status: AC
  Administered 2019-09-25: 1000 mL via INTRAVENOUS

## 2019-09-25 MED ORDER — DILTIAZEM HCL 25 MG/5ML IV SOLN
10.0000 mg | Freq: Once | INTRAVENOUS | Status: AC
  Administered 2019-09-25: 10 mg via INTRAVENOUS
  Filled 2019-09-25: qty 5

## 2019-09-25 NOTE — Discharge Instructions (Addendum)
Please increase your dose of metoprolol to 25 mg twice a day. Keep the rescue medications at their current dosing. Please follow-up with cardiology.

## 2019-09-25 NOTE — ED Notes (Signed)
Pt c/o mid-sternal chest pressure intermittently. Pt denies shortness of breath, dizziness, weakness, and/or nausea/vomiting.  EKG obtained and shown to Dr. Eudelia Bunch.

## 2019-09-28 DIAGNOSIS — R778 Other specified abnormalities of plasma proteins: Secondary | ICD-10-CM | POA: Insufficient documentation

## 2019-09-28 DIAGNOSIS — Z7189 Other specified counseling: Secondary | ICD-10-CM | POA: Insufficient documentation

## 2019-09-28 DIAGNOSIS — N182 Chronic kidney disease, stage 2 (mild): Secondary | ICD-10-CM | POA: Insufficient documentation

## 2019-09-28 NOTE — Progress Notes (Signed)
Cardiology Office Note   Date:  09/29/2019   ID:  Daniel Aguirre, DOB November 23, 1950, MRN [id number]  PCP:  Daniel Floro, MD  Cardiologist:   Daniel Rotunda, MD   Chief Complaint  Patient presents with  . Atrial Fibrillation      History of Present Illness: Daniel Aguirre is a 69 y.o. male who presents for evaluation of atrial fib.  He had this initially in 2019.  Was hospitalized at that time.  He had some elevated troponin but a follow-up negative perfusion study.  He did have some probably moderate aortic insufficiency with an eccentric jet.  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  He has some renal insufficiency.  He has been feeling well.  He works in Office manager at [employer].  He denies any cardiovascular symptoms until September 4.  He was in bed in the evening.  He might've been dehydrated a little bit working outside.  He had one gin and tonic that his neighbor brought him.  Few hours after that he developed rapid heart rate.  He took his [facility] reading and it was in the 140s and suggested atrial fibrillation.  He came to the emergency room.  I reviewed these records for this visit.  He was documented to be in atrial fibrillation.  His creatinine was mildly elevated from his baseline at 1.99.  He was treated with 2 doses of IV metoprolol and then IV Cardizem and converted to sinus rhythm.  He didn't have chest pressure, neck or arm discomfort.  He felt the palpitations.  He felt a little lightheaded.  He didn't have any shortness of breath, PND or orthopnea.  He has had no weight gain or edema.  Again this is only his second episode other than a few skipped palpitations occasionally.  Past Medical History:  Diagnosis Date  . CKD (chronic kidney disease), stage III   . Diastolic dysfunction    a. 09/2015 Echo: EF 65-70%, no rwma, Gr1 DD, Mod AI. Asc AO 34mm. Mildly dil Asc Ao.  LA 75mm.  Marland Kitchen Dyslipidemia   . Hypertension    . Kidney stones   . Moderate aortic insufficiency    a. 09/2015 Echo: Mod AI.  Marland Kitchen Right bundle branch block   . Testicular hypofunction     Past Surgical History:  Procedure Laterality Date  . CHOLECYSTECTOMY    . MOHS SURGERY       Current Outpatient Medications  Medication Sig Dispense Refill  . diltiazem (CARDIZEM CD) 240 MG 24 hr capsule Take 1 capsule (240 mg total) by mouth daily. 90 capsule 0  . ELIQUIS 5 MG TABS tablet TAKE 1 TABLET BY MOUTH TWICE A DAY 60 tablet 2  . metoprolol tartrate (LOPRESSOR) 25 MG tablet Take 25 mg by mouth 2 (two) times daily.    Marland Kitchen testosterone (ANDROGEL) 50 MG/5GM (1%) GEL APPLY 1 TUBE ONCE A DAY AS DIRECTED     No current facility-administered medications for this visit.    Allergies:   Amlodipine, Lisinopril, and Penicillins    ROS:  Please see the history of present illness.   Otherwise, review of systems are positive for none.   All other systems are reviewed and negative.    PHYSICAL EXAM: VS:  BP 132/70   Pulse 67   Ht 5\' 8"  (1.727 m)   Wt 189 lb (85.7 kg)   SpO2 98%   BMI 28.74 kg/m  ,  BMI Body mass index is 28.74 kg/m. GENERAL:  Well appearing NECK:  No jugular venous distention, waveform within normal limits, carotid upstroke brisk and symmetric, no bruits, no thyromegaly LUNGS:  Clear to auscultation bilaterally CHEST:  Unremarkable HEART:  PMI not displaced or sustained,S1 and S2 within normal limits, no S3, no S4, no clicks, no rubs, soft apical systolic murmur, no diastolic murmurs ABD:  Flat, positive bowel sounds normal in frequency in pitch, no bruits, no rebound, no guarding, no midline pulsatile mass, no hepatomegaly, no splenomegaly EXT:  2 plus pulses throughout, no edema, no cyanosis no clubbing   EKG:  EKG is ordered today. The ekg ordered today demonstrates sinus rhythm, rate 65, right bundle branch block, left anterior fascicular block, nonspecific T wave changes unchanged from previous.   Recent  Labs: 09/24/2019: BUN 21; Creatinine, Ser 1.99; Hemoglobin 16.2; Platelets 212; Potassium 4.2; Sodium 141    Lipid Panel No results found for: CHOL, TRIG, HDL, CHOLHDL, VLDL, LDLCALC, LDLDIRECT    Wt Readings from Last 3 Encounters:  09/29/19 189 lb (85.7 kg)  09/24/19 180 lb (81.6 kg)  10/02/18 177 lb (80.3 kg)      Other studies Reviewed: Additional studies/ records that were reviewed today include: ED records. Review of the above records demonstrates:  Please see elsewhere in the note.     ASSESSMENT AND PLAN:  ATRIAL FIB:Daniel Leader Schumakerhas a CHA2DS2 - VASc score of 2.  This is the second episode.  We talked about this.  He is taking anticoagulation.  He was sent home on a slightly higher dose of beta-blocker.  At this point he'd like to wait and see if he has any further episodes before considering antiarrhythmic therapy.  We talked about this and potential for ablation in the future.  We talked about staying hydrated avoiding alcohol and using an extra dose of beta-blocker if this happens again.  HTN:His blood pressure is at target.  This will be managed in the context of treating his arrhythmia.  STAGE III CKD:  He has had mild stable renal insufficiency.    Creatinine was mildly elevated at 1.99 but he says he has follow-up with Dr. Tenny Craw soon.  ABNORMAL EKG:He has right bundle branch block and left anterior fascicular block are unchanged from previous.   He doesn't recall me talking about this with him but when reviewed it again.  Has not had any presyncope or syncope and would let me know if he does.  AI:He has moderate aortic insufficiency on echo in 2019.  I will follow up with an echocardiogram.  COVID EDUCATION: He has been vaccinated.  Current medicines are reviewed at length with the patient today.  The patient does not have concerns regarding medicines.  The following changes have been made:  no change  Labs/ tests ordered today include:   Orders  Placed This Encounter  Procedures  . EKG 12-Lead  . ECHOCARDIOGRAM COMPLETE     Disposition:   FU with me in 3 months.     Signed, Daniel Rotunda, MD  09/29/2019 10:04 AM    [facility]

## 2019-09-29 ENCOUNTER — Ambulatory Visit (INDEPENDENT_AMBULATORY_CARE_PROVIDER_SITE_OTHER): Payer: No Typology Code available for payment source | Admitting: Cardiology

## 2019-09-29 ENCOUNTER — Other Ambulatory Visit: Payer: Self-pay

## 2019-09-29 ENCOUNTER — Encounter: Payer: Self-pay | Admitting: Cardiology

## 2019-09-29 VITALS — BP 132/70 | HR 67 | Ht 68.0 in | Wt 189.0 lb

## 2019-09-29 DIAGNOSIS — R7989 Other specified abnormal findings of blood chemistry: Secondary | ICD-10-CM

## 2019-09-29 DIAGNOSIS — N182 Chronic kidney disease, stage 2 (mild): Secondary | ICD-10-CM | POA: Diagnosis not present

## 2019-09-29 DIAGNOSIS — Z7189 Other specified counseling: Secondary | ICD-10-CM

## 2019-09-29 DIAGNOSIS — I10 Essential (primary) hypertension: Secondary | ICD-10-CM

## 2019-09-29 DIAGNOSIS — I480 Paroxysmal atrial fibrillation: Secondary | ICD-10-CM | POA: Diagnosis not present

## 2019-09-29 DIAGNOSIS — R778 Other specified abnormalities of plasma proteins: Secondary | ICD-10-CM | POA: Diagnosis not present

## 2019-09-29 DIAGNOSIS — I351 Nonrheumatic aortic (valve) insufficiency: Secondary | ICD-10-CM

## 2019-09-29 MED ORDER — DILTIAZEM HCL ER COATED BEADS 240 MG PO CP24: 240.0000 mg | ORAL_CAPSULE | Freq: Every day | ORAL | 0 refills | Status: DC

## 2019-09-29 NOTE — Patient Instructions (Signed)
Medication Instructions:  No Changes In Medications at this time.  *If you need a refill on your cardiac medications before your next appointment, please call your pharmacy*  Lab Work: None Ordered At This Time.  If you have labs (blood work) drawn today and your tests are completely normal, you will receive your results only by: Marland Kitchen MyChart Message (if you have MyChart) OR . A paper copy in the mail If you have any lab test that is abnormal or we need to change your treatment, we will call you to review the results.  Testing/Procedures: Your physician has requested that you have an echocardiogram. Echocardiography is a painless test that uses sound waves to create images of your heart. It provides your doctor with information about the size and shape of your heart and how well your heart's chambers and valves are working. You may receive an ultrasound enhancing agent through an IV if needed to better visualize your heart during the echo.This procedure takes approximately one hour. There are no restrictions for this procedure. This will take place at the [address].   Follow-Up: At [facility], you and your health needs are our priority.  As part of our continuing mission to provide you with exceptional heart care, we have created designated Provider Care Teams.  These Care Teams include your primary Cardiologist (physician) and Advanced Practice Providers (APPs -  Physician Assistants and Nurse Practitioners) who all work together to provide you with the care you need, when you need it.  Your next appointment:   3 month(s)  The format for your next appointment:   In Person  Provider:   Rollene Rotunda, MD

## 2019-10-13 ENCOUNTER — Other Ambulatory Visit (HOSPITAL_COMMUNITY): Payer: No Typology Code available for payment source

## 2019-10-13 ENCOUNTER — Ambulatory Visit (HOSPITAL_COMMUNITY): Payer: No Typology Code available for payment source | Attending: Internal Medicine

## 2019-10-13 ENCOUNTER — Other Ambulatory Visit: Payer: Self-pay

## 2019-10-13 DIAGNOSIS — I480 Paroxysmal atrial fibrillation: Secondary | ICD-10-CM | POA: Insufficient documentation

## 2019-10-13 DIAGNOSIS — I10 Essential (primary) hypertension: Secondary | ICD-10-CM | POA: Diagnosis not present

## 2019-10-13 DIAGNOSIS — I351 Nonrheumatic aortic (valve) insufficiency: Secondary | ICD-10-CM | POA: Insufficient documentation

## 2019-10-14 LAB — ECHOCARDIOGRAM COMPLETE
AV Mean grad: 8 mmHg
Area-P 1/2: 2.36 cm2
P 1/2 time: 823 msec
S' Lateral: 2.5 cm

## 2019-12-28 NOTE — Progress Notes (Signed)
Cardiology Office Note   Date:  12/29/2019   ID:  Daniel Aguirre, DOB 05-Mar-1950, MRN [id number]  PCP:  Daisy Floro, MD  Cardiologist:   Rollene Rotunda, MD   Chief Complaint  Patient presents with  . Palpitations      History of Present Illness: Daniel Aguirre is a 69 y.o. male who presents for evaluation of atrial fib.  He had this initially in 2019.  He was hospitalized at that time.  He had some elevated troponin but a follow-up negative perfusion study.  He did have some probably moderate aortic insufficiency with an eccentric jet.  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  He has some renal insufficiency.  Since I last saw him he has done well.  He does a lot of stairs on his job.  He does significant yard work.  He had no further symptomatic tachypalpitations similar to his previous atrial fibrillation.  He has had no new events recorded on his home monitor.  He denies any presyncope or syncope.  He has had no chest pressure, neck or arm discomfort.  He said no weight gain or edema.   Past Medical History:  Diagnosis Date  . CKD (chronic kidney disease), stage III (HCC)   . Diastolic dysfunction    a. 09/2015 Echo: EF 65-70%, no rwma, Gr1 DD, Mod AI. Asc AO 32mm. Mildly dil Asc Ao.  LA 23mm.  Marland Kitchen Dyslipidemia   . Hypertension   . Kidney stones   . Moderate aortic insufficiency    a. 09/2015 Echo: Mod AI.  Marland Kitchen Right bundle branch block   . Testicular hypofunction     Past Surgical History:  Procedure Laterality Date  . CHOLECYSTECTOMY    . MOHS SURGERY       Current Outpatient Medications  Medication Sig Dispense Refill  . Cholecalciferol (VITAMIN D3) 125 MCG (5000 UT) CAPS Take 1 capsule by mouth daily.    Marland Kitchen diltiazem (CARDIZEM CD) 240 MG 24 hr capsule Take 1 capsule (240 mg total) by mouth daily. 90 capsule 0  . ELIQUIS 5 MG TABS tablet TAKE 1 TABLET BY MOUTH TWICE A DAY 60 tablet 2  . metoprolol tartrate  (LOPRESSOR) 25 MG tablet Take 25 mg by mouth 2 (two) times daily.    . tamsulosin (FLOMAX) 0.4 MG CAPS capsule Take 0.4 mg by mouth daily.    Marland Kitchen testosterone (ANDROGEL) 50 MG/5GM (1%) GEL APPLY 1 TUBE ONCE A DAY AS DIRECTED     No current facility-administered medications for this visit.    Allergies:   Amlodipine, Lisinopril, and Penicillins    ROS:  Please see the history of present illness.   Otherwise, review of systems are positive for none.   All other systems are reviewed and negative.    PHYSICAL EXAM: VS:  BP 128/72   Pulse 66   Ht 5\' 8"  (1.727 m)   Wt 189 lb 6.4 oz (85.9 kg)   BMI 28.80 kg/m  , BMI Body mass index is 28.8 kg/m.  GENERAL:  Well appearing NECK:  No jugular venous distention, waveform within normal limits, carotid upstroke brisk and symmetric, no bruits, no thyromegaly LUNGS:  Clear to auscultation bilaterally CHEST:  Unremarkable HEART:  PMI not displaced or sustained,S1 and S2 within normal limits, no S3, no S4, no clicks, no rubs, no murmurs ABD:  Flat, positive bowel sounds normal in frequency in pitch, no bruits, no rebound, no guarding, no  midline pulsatile mass, no hepatomegaly, no splenomegaly EXT:  2 plus pulses throughout, no edema, no cyanosis no clubbing    EKG:  EKG is  ordered today. The ekg ordered today demonstrates sinus rhythm, rate 66, right bundle branch block, left anterior fascicular block, nonspecific T wave changes unchanged from previous.   Recent Labs: 09/24/2019: BUN 21; Creatinine, Ser 1.99; Hemoglobin 16.2; Platelets 212; Potassium 4.2; Sodium 141    Lipid Panel No results found for: CHOL, TRIG, HDL, CHOLHDL, VLDL, LDLCALC, LDLDIRECT    Wt Readings from Last 3 Encounters:  12/29/19 189 lb 6.4 oz (85.9 kg)  09/29/19 189 lb (85.7 kg)  09/24/19 180 lb (81.6 kg)      Other studies Reviewed: Additional studies/ records that were reviewed today include:None  Review of the above records demonstrates:  Please see elsewhere  in the note.     ASSESSMENT AND PLAN:  ATRIAL FIB:Mr.Daniel Leader Schumakerhas a CHA2DS2 - VASc score of 3.  He has had no symptomatic tachypalpitations.  No change in therapy.  We discussed possible medical management or ablation if he has any recurrent episodes.   HTN:  His blood pressure is at target.  No change in therapy.   STAGE III CKD:  Creatinine was 1.99 which is stable.  He follows with Dr. Tenny Craw for this.  ABNORMAL EKG:  We talked about this at length.  He said no presyncope or syncope but would let me know if he has this in the future.   AI:He has moderate aortic insufficiency on echo in 2019 and earlier this year.  I will follow this again in a couple of years with a repeat echo  COVID EDUCATION: He has been vaccinated.  Current medicines are reviewed at length with the patient today.  The patient does not have concerns regarding medicines.  The following changes have been made:  no change  Labs/ tests ordered today include:   Orders Placed This Encounter  Procedures  . EKG 12-Lead     Disposition:   FU with me in 3 months.     Signed, Rollene Rotunda, MD  12/29/2019 10:42 AM     [facility]

## 2019-12-29 ENCOUNTER — Encounter: Payer: Self-pay | Admitting: Cardiology

## 2019-12-29 ENCOUNTER — Ambulatory Visit (INDEPENDENT_AMBULATORY_CARE_PROVIDER_SITE_OTHER): Payer: No Typology Code available for payment source | Admitting: Cardiology

## 2019-12-29 ENCOUNTER — Other Ambulatory Visit: Payer: Self-pay

## 2019-12-29 VITALS — BP 128/72 | HR 66 | Ht 68.0 in | Wt 189.4 lb

## 2019-12-29 DIAGNOSIS — I10 Essential (primary) hypertension: Secondary | ICD-10-CM | POA: Diagnosis not present

## 2019-12-29 DIAGNOSIS — I480 Paroxysmal atrial fibrillation: Secondary | ICD-10-CM

## 2019-12-29 DIAGNOSIS — N1831 Chronic kidney disease, stage 3a: Secondary | ICD-10-CM | POA: Diagnosis not present

## 2019-12-29 DIAGNOSIS — R9431 Abnormal electrocardiogram [ECG] [EKG]: Secondary | ICD-10-CM

## 2019-12-29 NOTE — Patient Instructions (Signed)

## 2020-01-22 ENCOUNTER — Other Ambulatory Visit: Payer: Self-pay

## 2020-01-22 ENCOUNTER — Emergency Department (HOSPITAL_COMMUNITY): Payer: No Typology Code available for payment source

## 2020-01-22 ENCOUNTER — Encounter (HOSPITAL_COMMUNITY): Payer: Self-pay | Admitting: Emergency Medicine

## 2020-01-22 ENCOUNTER — Inpatient Hospital Stay (HOSPITAL_COMMUNITY)
Admission: EM | Admit: 2020-01-22 | Discharge: 2020-01-29 | DRG: 391 | Disposition: A | Discharge status: Home / Self Care | Payer: No Typology Code available for payment source | Source: Ambulatory Visit | Attending: Family Medicine | Admitting: Family Medicine

## 2020-01-22 DIAGNOSIS — Z87442 Personal history of urinary calculi: Secondary | ICD-10-CM

## 2020-01-22 DIAGNOSIS — Z79899 Other long term (current) drug therapy: Secondary | ICD-10-CM

## 2020-01-22 DIAGNOSIS — N183 Chronic kidney disease, stage 3 unspecified: Secondary | ICD-10-CM | POA: Diagnosis present

## 2020-01-22 DIAGNOSIS — Z8249 Family history of ischemic heart disease and other diseases of the circulatory system: Secondary | ICD-10-CM

## 2020-01-22 DIAGNOSIS — R197 Diarrhea, unspecified: Secondary | ICD-10-CM | POA: Diagnosis present

## 2020-01-22 DIAGNOSIS — E291 Testicular hypofunction: Secondary | ICD-10-CM | POA: Diagnosis present

## 2020-01-22 DIAGNOSIS — I130 Hypertensive heart and chronic kidney disease with heart failure and stage 1 through stage 4 chronic kidney disease, or unspecified chronic kidney disease: Secondary | ICD-10-CM | POA: Diagnosis present

## 2020-01-22 DIAGNOSIS — Z7901 Long term (current) use of anticoagulants: Secondary | ICD-10-CM

## 2020-01-22 DIAGNOSIS — K56609 Unspecified intestinal obstruction, unspecified as to partial versus complete obstruction: Secondary | ICD-10-CM

## 2020-01-22 DIAGNOSIS — I5032 Chronic diastolic (congestive) heart failure: Secondary | ICD-10-CM | POA: Diagnosis present

## 2020-01-22 DIAGNOSIS — I351 Nonrheumatic aortic (valve) insufficiency: Secondary | ICD-10-CM | POA: Diagnosis present

## 2020-01-22 DIAGNOSIS — M549 Dorsalgia, unspecified: Secondary | ICD-10-CM | POA: Diagnosis not present

## 2020-01-22 DIAGNOSIS — N1831 Chronic kidney disease, stage 3a: Secondary | ICD-10-CM | POA: Diagnosis present

## 2020-01-22 DIAGNOSIS — K5669 Other partial intestinal obstruction: Secondary | ICD-10-CM | POA: Diagnosis present

## 2020-01-22 DIAGNOSIS — I10 Essential (primary) hypertension: Secondary | ICD-10-CM | POA: Diagnosis present

## 2020-01-22 DIAGNOSIS — K651 Peritoneal abscess: Secondary | ICD-10-CM | POA: Diagnosis present

## 2020-01-22 DIAGNOSIS — Z20822 Contact with and (suspected) exposure to covid-19: Secondary | ICD-10-CM | POA: Diagnosis present

## 2020-01-22 DIAGNOSIS — I472 Ventricular tachycardia: Secondary | ICD-10-CM | POA: Diagnosis not present

## 2020-01-22 DIAGNOSIS — Z800 Family history of malignant neoplasm of digestive organs: Secondary | ICD-10-CM

## 2020-01-22 DIAGNOSIS — K5792 Diverticulitis of intestine, part unspecified, without perforation or abscess without bleeding: Secondary | ICD-10-CM | POA: Diagnosis present

## 2020-01-22 DIAGNOSIS — K5720 Diverticulitis of large intestine with perforation and abscess without bleeding: Principal | ICD-10-CM | POA: Diagnosis present

## 2020-01-22 DIAGNOSIS — Z79890 Hormone replacement therapy (postmenopausal): Secondary | ICD-10-CM

## 2020-01-22 DIAGNOSIS — I4891 Unspecified atrial fibrillation: Secondary | ICD-10-CM | POA: Diagnosis present

## 2020-01-22 DIAGNOSIS — I480 Paroxysmal atrial fibrillation: Secondary | ICD-10-CM | POA: Diagnosis present

## 2020-01-22 DIAGNOSIS — I5030 Unspecified diastolic (congestive) heart failure: Secondary | ICD-10-CM | POA: Diagnosis present

## 2020-01-22 DIAGNOSIS — Z823 Family history of stroke: Secondary | ICD-10-CM

## 2020-01-22 DIAGNOSIS — Z888 Allergy status to other drugs, medicaments and biological substances status: Secondary | ICD-10-CM

## 2020-01-22 DIAGNOSIS — Z9049 Acquired absence of other specified parts of digestive tract: Secondary | ICD-10-CM

## 2020-01-22 DIAGNOSIS — E785 Hyperlipidemia, unspecified: Secondary | ICD-10-CM | POA: Diagnosis present

## 2020-01-22 LAB — CBC
HCT: 49.8 % (ref 39.0–52.0)
Hemoglobin: 16.9 g/dL (ref 13.0–17.0)
MCH: 31 pg (ref 26.0–34.0)
MCHC: 33.9 g/dL (ref 30.0–36.0)
MCV: 91.4 fL (ref 80.0–100.0)
Platelets: 197 10*3/uL (ref 150–400)
RBC: 5.45 MIL/uL (ref 4.22–5.81)
RDW: 13.7 % (ref 11.5–15.5)
WBC: 8.8 10*3/uL (ref 4.0–10.5)
nRBC: 0 % (ref 0.0–0.2)

## 2020-01-22 LAB — COMPREHENSIVE METABOLIC PANEL
ALT: 32 U/L (ref 0–44)
AST: 22 U/L (ref 15–41)
Albumin: 3.9 g/dL (ref 3.5–5.0)
Alkaline Phosphatase: 71 U/L (ref 38–126)
Anion gap: 13 (ref 5–15)
BUN: 23 mg/dL (ref 8–23)
CO2: 19 mmol/L — ABNORMAL LOW (ref 22–32)
Calcium: 9.1 mg/dL (ref 8.9–10.3)
Chloride: 107 mmol/L (ref 98–111)
Creatinine, Ser: 1.58 mg/dL — ABNORMAL HIGH (ref 0.61–1.24)
GFR, Estimated: 48 mL/min — ABNORMAL LOW (ref >60)
Glucose, Bld: 94 mg/dL (ref 70–99)
Potassium: 4.2 mmol/L (ref 3.5–5.1)
Sodium: 139 mmol/L (ref 135–145)
Total Bilirubin: 1.3 mg/dL — ABNORMAL HIGH (ref 0.3–1.2)
Total Protein: 7.6 g/dL (ref 6.5–8.1)

## 2020-01-22 LAB — URINALYSIS, ROUTINE W REFLEX MICROSCOPIC
Bilirubin Urine: NEGATIVE
Glucose, UA: NEGATIVE mg/dL
Hgb urine dipstick: NEGATIVE
Ketones, ur: 20 mg/dL — AB
Leukocytes,Ua: NEGATIVE
Nitrite: NEGATIVE
Protein, ur: 100 mg/dL — AB
Specific Gravity, Urine: 1.039 — ABNORMAL HIGH (ref 1.005–1.030)
pH: 5 (ref 5.0–8.0)

## 2020-01-22 LAB — LIPASE, BLOOD: Lipase: 34 U/L (ref 11–51)

## 2020-01-22 MED ORDER — LACTATED RINGERS IV BOLUS
1000.0000 mL | Freq: Once | INTRAVENOUS | Status: AC
  Administered 2020-01-23: 1000 mL via INTRAVENOUS

## 2020-01-22 MED ORDER — IOHEXOL 300 MG/ML  SOLN
100.0000 mL | Freq: Once | INTRAMUSCULAR | Status: AC | PRN
  Administered 2020-01-23: 100 mL via INTRAVENOUS

## 2020-01-22 NOTE — ED Triage Notes (Signed)
Patient c/o abdominal pain with diarrhea x5 days. Seen at [facility] and sent for further evaluation.

## 2020-01-22 NOTE — ED Provider Notes (Signed)
[facility] DEPT Provider Note: Georgena Spurling, MD, FACEP  CSN: [id number] MRN: [id number] ARRIVAL: 01/22/20 at 71 ROOM: WA03/WA03   CHIEF COMPLAINT  Abdominal Pain and Diarrhea   HISTORY OF PRESENT ILLNESS  01/22/20 11:23 PM Daniel Aguirre is a 70 y.o. male developed a sharp lower abdominal pain 4 days ago that was followed the next day by explosive, watery diarrhea that is persisted.  He is also had persistent lower crampy abdominal pain which is moderate in severity.  He was seen at an urgent care this afternoon and was referred to the emergency department for a CT scan.  He has had a fever with this controlled with Tylenol.  He has had no nausea or vomiting but has had a decreased appetite and has had little oral intake.  He feels his abdomen is distended.  He has had no blood in his stool.  He had a similar illness around Thanksgiving but did not seek medical attention at that time.   Past Medical History:  Diagnosis Date  . CKD (chronic kidney disease), stage III ([place])   . Diastolic dysfunction    a. 09/2015 Echo: EF 65-70%, no rwma, Gr1 DD, Mod AI. Asc AO 30mm. Mildly dil Asc Ao.  LA 53mm.  Marland Kitchen Dyslipidemia   . Hypertension   . Kidney stones   . Moderate aortic insufficiency    a. 09/2015 Echo: Mod AI.  Marland Kitchen Right bundle branch block   . Testicular hypofunction     Past Surgical History:  Procedure Laterality Date  . CHOLECYSTECTOMY    . MOHS SURGERY      Family History  Problem Relation Age of Onset  . Colon cancer Mother 42  . AAA (abdominal aortic aneurysm) Father 4  . Stroke Father   . Heart attack Brother 25       presumed MI - SCD  . Stroke Brother 37    Social History   Tobacco Use  . Smoking status: Never Smoker  . Smokeless tobacco: Never Used  Vaping Use  . Vaping Use: Never used  Substance Use Topics  . Alcohol use: Yes  . Drug use: Never    Prior to Admission medications   Medication Sig Start Date End Date Taking? Authorizing Provider   acetaminophen (TYLENOL 8 HOUR ARTHRITIS PAIN) 650 MG CR tablet Take 1,300 mg by mouth in the morning, at noon, and at bedtime.   Yes [provider]  Cholecalciferol (VITAMIN D3) 125 MCG (5000 UT) CAPS Take 1 capsule by mouth daily. 04/21/19  Yes [provider]  diltiazem (CARDIZEM CD) 240 MG 24 hr capsule Take 1 capsule (240 mg total) by mouth daily. 09/29/19  Yes Minus Breeding, MD  ELIQUIS 5 MG TABS tablet TAKE 1 TABLET BY MOUTH TWICE A DAY 04/12/18  Yes Minus Breeding, MD  metoprolol tartrate (LOPRESSOR) 25 MG tablet Take 25 mg by mouth 2 (two) times daily.   Yes [provider]  tamsulosin (FLOMAX) 0.4 MG CAPS capsule Take 0.4 mg by mouth daily. 10/04/19  Yes [provider]  testosterone (ANDROGEL) 50 MG/5GM (1%) GEL APPLY 1 TUBE ONCE A DAY AS DIRECTED 04/08/18  Yes [provider]    Allergies Amlodipine, Lisinopril, and Penicillins   REVIEW OF SYSTEMS  Negative except as noted here or in the History of Present Illness.   PHYSICAL EXAMINATION  Initial Vital Signs Blood pressure (!) 149/92, pulse (!) 103, temperature 98.5 F (36.9 C), temperature source Oral, resp. rate 14, SpO2 98 %.  Examination General: Well-developed, well-nourished male in no acute distress; appearance consistent with age of record HENT: normocephalic; atraumatic Eyes: pupils equal, round and reactive to light; extraocular muscles intact Neck: supple Heart: regular rate and rhythm Lungs: clear to auscultation bilaterally Abdomen: soft; mildly distended; lower tenderness; bowel sounds present Extremities: No deformity; full range of motion; pulses normal Neurologic: Awake, alert and oriented; motor function intact in all extremities and symmetric; no facial droop Skin: Warm and dry Psychiatric: Normal mood and affect   RESULTS  Summary of this visit's results, reviewed and interpreted by myself:   EKG Interpretation  Date/Time:    Ventricular Rate:    PR  Interval:    QRS Duration:   QT Interval:    QTC Calculation:   R Axis:     Text Interpretation:        Laboratory Studies: Results for orders placed or performed during the hospital encounter of 01/22/20 (from the past 24 hour(s))  Lipase, blood     Status: None   Collection Time: 01/22/20  9:29 PM  Result Value Ref Range   Lipase 34 11 - 51 U/L  Comprehensive metabolic panel     Status: Abnormal   Collection Time: 01/22/20  9:29 PM  Result Value Ref Range   Sodium 139 135 - 145 mmol/L   Potassium 4.2 3.5 - 5.1 mmol/L   Chloride 107 98 - 111 mmol/L   CO2 19 (L) 22 - 32 mmol/L   Glucose, Bld 94 70 - 99 mg/dL   BUN 23 8 - 23 mg/dL   Creatinine, Ser 0.45 (H) 0.61 - 1.24 mg/dL   Calcium 9.1 8.9 - 40.9 mg/dL   Total Protein 7.6 6.5 - 8.1 g/dL   Albumin 3.9 3.5 - 5.0 g/dL   AST 22 15 - 41 U/L   ALT 32 0 - 44 U/L   Alkaline Phosphatase 71 38 - 126 U/L   Total Bilirubin 1.3 (H) 0.3 - 1.2 mg/dL   GFR, Estimated 48 (L) >60 mL/min   Anion gap 13 5 - 15  CBC     Status: None   Collection Time: 01/22/20  9:29 PM  Result Value Ref Range   WBC 8.8 4.0 - 10.5 K/uL   RBC 5.45 4.22 - 5.81 MIL/uL   Hemoglobin 16.9 13.0 - 17.0 g/dL   HCT 81.1 91.4 - 78.2 %   MCV 91.4 80.0 - 100.0 fL   MCH 31.0 26.0 - 34.0 pg   MCHC 33.9 30.0 - 36.0 g/dL   RDW 95.6 21.3 - 08.6 %   Platelets 197 150 - 400 K/uL   nRBC 0.0 0.0 - 0.2 %  Urinalysis, Routine w reflex microscopic Urine, Clean Catch     Status: Abnormal   Collection Time: 01/22/20  9:29 PM  Result Value Ref Range   Color, Urine AMBER (A) YELLOW   APPearance CLEAR CLEAR   Specific Gravity, Urine 1.039 (H) 1.005 - 1.030   pH 5.0 5.0 - 8.0   Glucose, UA NEGATIVE NEGATIVE mg/dL   Hgb urine dipstick NEGATIVE NEGATIVE   Bilirubin Urine NEGATIVE NEGATIVE   Ketones, ur 20 (A) NEGATIVE mg/dL   Protein, ur 578 (A) NEGATIVE mg/dL   Nitrite NEGATIVE NEGATIVE   Leukocytes,Ua NEGATIVE NEGATIVE   RBC / HPF 0-5 0 - 5 RBC/hpf   WBC, UA 0-5 0 - 5  WBC/hpf   Bacteria, UA RARE (A) NONE SEEN   Mucus PRESENT    Imaging Studies: CT ABDOMEN PELVIS W CONTRAST  Result Date: 01/23/2020 CLINICAL DATA:  Diarrhea EXAM: CT ABDOMEN AND PELVIS WITH CONTRAST TECHNIQUE: Multidetector CT imaging of the abdomen and pelvis was performed using the standard protocol following bolus administration of intravenous contrast. CONTRAST:  OMNIPAQUE IOHEXOL 300 MG/ML  SOLN COMPARISON:  None. FINDINGS: Lower chest: The visualized lung bases are clear bilaterally. The visualized heart and pericardium are unremarkable. Hepatobiliary: Cholecystectomy has been performed. Liver unremarkable. No intra or extrahepatic biliary ductal dilation. Pancreas: Unremarkable Spleen: Unremarkable Adrenals/Urinary Tract: The adrenal glands are unremarkable. 3 mm nonobstructing calculus noted within the upper pole of the left kidney. Simple cortical cyst noted within the left kidney. The kidneys are otherwise unremarkable. Bladder is unremarkable. Stomach/Bowel: There is severe descending and sigmoid diverticulosis. There is superimposed circumferential bowel wall thickening involving the mid sigmoid colon with pericolonic inflammatory stranding in keeping with changes of perforated sigmoid diverticulitis. There is an adjacent pericolonic abscess within the sigmoid colonic mesentery measuring 2.2 x 3.0 x 4.2 cm on axial image # 58 and coronal image # 73. The abscess is in close proximity to multiple loops of small bowel which demonstrate hyperemia, likely related to the adjacent inflammatory process. This is most evident on axial image # 56 and coronal image # 75. There is a resultant high-grade partial small bowel obstruction with multiple fluid-filled dilated loops of small bowel proximally. There is mild ascites. No free intraperitoneal gas. The appendix is normal. Vascular/Lymphatic: No significant vascular findings are present. No enlarged abdominal or pelvic lymph nodes. Reproductive:  Prostate is unremarkable. Other: No abdominal wall hernia. Musculoskeletal: No acute bone abnormality. No lytic or blastic bone lesion. IMPRESSION: Perforated sigmoid diverticulitis with 4.2 cm abscess within the small bowel mesentery. Extensive small-bowel inflammation adjacent to the a diverticular abscess with resultant high-grade partial small bowel obstruction involving the distal small bowel. Extensive hyperemia of the distal small bowel, likely reactive to the adjacent inflammatory process within the mesentery. Background severe descending and sigmoid colonic diverticulosis. Electronically Signed   By: Helyn Numbers MD   On: 01/23/2020 00:39    ED COURSE and MDM  Nursing notes, initial and subsequent vitals signs, including pulse oximetry, reviewed and interpreted by myself.  Vitals:   01/22/20 2130 01/23/20 0000 01/23/20 0030 01/23/20 0100  BP: (!) 149/92 129/88 138/81 132/84  Pulse: (!) 103 78 80 76  Resp: 14 15  14   Temp:      TempSrc:      SpO2: 98% 97% 98% 95%   Medications  ceFEPIme (MAXIPIME) 2 g in sodium chloride 0.9 % 100 mL IVPB (has no administration in time range)    And  metroNIDAZOLE (FLAGYL) IVPB 500 mg (has no administration in time range)  lactated ringers bolus 1,000 mL (1,000 mLs Intravenous New Bag/Given (Non-Interop) 01/23/20 0002)  iohexol (OMNIPAQUE) 300 MG/ML solution 100 mL (100 mLs Intravenous Contrast Given 01/23/20 0009)   1:25 AM Cefepime and Flagyl IV ordered for severe diverticulitis with perforation and abscess.  Will have patient admitted to the hospitalist service.  1:52 AM Dr. 03/22/20 to admit to hospitalist service.  Discussed with Dr. Toniann Fail of general surgery who will consult.  He does not plan on emergency surgery tonight but will consult interventional radiology later this morning.  He is advises we discontinue Eliquis as the patient is not currently in atrial fibrillation.  If patient returns to atrial fibrillation unfractionated heparin would  be acceptable.  He advises to hold off on NG tube unless the patient develops vomiting.  The patient  has been made n.p.o.   PROCEDURES  Procedures CRITICAL CARE Performed by: Carlisle Beers Tajuan Dufault Total critical care time: 35 minutes Critical care time was exclusive of separately billable procedures and treating other patients. Critical care was necessary to treat or prevent imminent or life-threatening deterioration. Critical care was time spent personally by me on the following activities: development of treatment plan with patient and/or surrogate as well as nursing, discussions with consultants, evaluation of patient's response to treatment, examination of patient, obtaining history from patient or surrogate, ordering and performing treatments and interventions, ordering and review of laboratory studies, ordering and review of radiographic studies, pulse oximetry and re-evaluation of patient's condition.   ED DIAGNOSES     ICD-10-CM   1. Diverticulitis of large intestine with perforation and abscess without bleeding  K57.20   2. SBO (small bowel obstruction) (HCC)  K56.609        Duanna Runk, Jonny Ruiz, MD 01/23/20 0459

## 2020-01-23 ENCOUNTER — Emergency Department (HOSPITAL_COMMUNITY): Payer: No Typology Code available for payment source

## 2020-01-23 ENCOUNTER — Encounter (HOSPITAL_COMMUNITY): Payer: Self-pay | Admitting: Internal Medicine

## 2020-01-23 DIAGNOSIS — Z9049 Acquired absence of other specified parts of digestive tract: Secondary | ICD-10-CM | POA: Diagnosis not present

## 2020-01-23 DIAGNOSIS — I130 Hypertensive heart and chronic kidney disease with heart failure and stage 1 through stage 4 chronic kidney disease, or unspecified chronic kidney disease: Secondary | ICD-10-CM | POA: Diagnosis present

## 2020-01-23 DIAGNOSIS — Z79890 Hormone replacement therapy (postmenopausal): Secondary | ICD-10-CM | POA: Diagnosis not present

## 2020-01-23 DIAGNOSIS — K651 Peritoneal abscess: Secondary | ICD-10-CM | POA: Diagnosis present

## 2020-01-23 DIAGNOSIS — K5792 Diverticulitis of intestine, part unspecified, without perforation or abscess without bleeding: Secondary | ICD-10-CM | POA: Diagnosis present

## 2020-01-23 DIAGNOSIS — Z7901 Long term (current) use of anticoagulants: Secondary | ICD-10-CM | POA: Diagnosis not present

## 2020-01-23 DIAGNOSIS — K5720 Diverticulitis of large intestine with perforation and abscess without bleeding: Secondary | ICD-10-CM

## 2020-01-23 DIAGNOSIS — Z8249 Family history of ischemic heart disease and other diseases of the circulatory system: Secondary | ICD-10-CM | POA: Diagnosis not present

## 2020-01-23 DIAGNOSIS — I5030 Unspecified diastolic (congestive) heart failure: Secondary | ICD-10-CM | POA: Diagnosis present

## 2020-01-23 DIAGNOSIS — Z87442 Personal history of urinary calculi: Secondary | ICD-10-CM | POA: Diagnosis not present

## 2020-01-23 DIAGNOSIS — I351 Nonrheumatic aortic (valve) insufficiency: Secondary | ICD-10-CM | POA: Diagnosis present

## 2020-01-23 DIAGNOSIS — E291 Testicular hypofunction: Secondary | ICD-10-CM | POA: Diagnosis present

## 2020-01-23 DIAGNOSIS — Z20822 Contact with and (suspected) exposure to covid-19: Secondary | ICD-10-CM | POA: Diagnosis present

## 2020-01-23 DIAGNOSIS — N1831 Chronic kidney disease, stage 3a: Secondary | ICD-10-CM | POA: Diagnosis present

## 2020-01-23 DIAGNOSIS — I480 Paroxysmal atrial fibrillation: Secondary | ICD-10-CM | POA: Diagnosis present

## 2020-01-23 DIAGNOSIS — I5032 Chronic diastolic (congestive) heart failure: Secondary | ICD-10-CM | POA: Diagnosis present

## 2020-01-23 DIAGNOSIS — I472 Ventricular tachycardia: Secondary | ICD-10-CM | POA: Diagnosis not present

## 2020-01-23 DIAGNOSIS — R197 Diarrhea, unspecified: Secondary | ICD-10-CM | POA: Diagnosis present

## 2020-01-23 DIAGNOSIS — Z823 Family history of stroke: Secondary | ICD-10-CM | POA: Diagnosis not present

## 2020-01-23 DIAGNOSIS — K5669 Other partial intestinal obstruction: Secondary | ICD-10-CM | POA: Diagnosis present

## 2020-01-23 DIAGNOSIS — Z79899 Other long term (current) drug therapy: Secondary | ICD-10-CM | POA: Diagnosis not present

## 2020-01-23 DIAGNOSIS — E785 Hyperlipidemia, unspecified: Secondary | ICD-10-CM | POA: Diagnosis present

## 2020-01-23 DIAGNOSIS — Z888 Allergy status to other drugs, medicaments and biological substances status: Secondary | ICD-10-CM | POA: Diagnosis not present

## 2020-01-23 DIAGNOSIS — M549 Dorsalgia, unspecified: Secondary | ICD-10-CM | POA: Diagnosis not present

## 2020-01-23 DIAGNOSIS — Z800 Family history of malignant neoplasm of digestive organs: Secondary | ICD-10-CM | POA: Diagnosis not present

## 2020-01-23 HISTORY — DX: Diverticulitis of large intestine with perforation and abscess without bleeding: K57.20

## 2020-01-23 LAB — RESP PANEL BY RT-PCR (FLU A&B, COVID) ARPGX2
Influenza A by PCR: NEGATIVE
Influenza B by PCR: NEGATIVE
SARS Coronavirus 2 by RT PCR: NEGATIVE

## 2020-01-23 LAB — CBC
HCT: 43.1 % (ref 39.0–52.0)
Hemoglobin: 14.4 g/dL (ref 13.0–17.0)
MCH: 31.1 pg (ref 26.0–34.0)
MCHC: 33.4 g/dL (ref 30.0–36.0)
MCV: 93.1 fL (ref 80.0–100.0)
Platelets: 176 10*3/uL (ref 150–400)
RBC: 4.63 MIL/uL (ref 4.22–5.81)
RDW: 13.7 % (ref 11.5–15.5)
WBC: 8 10*3/uL (ref 4.0–10.5)
nRBC: 0 % (ref 0.0–0.2)

## 2020-01-23 LAB — COMPREHENSIVE METABOLIC PANEL
ALT: 27 U/L (ref 0–44)
AST: 17 U/L (ref 15–41)
Albumin: 3.3 g/dL — ABNORMAL LOW (ref 3.5–5.0)
Alkaline Phosphatase: 59 U/L (ref 38–126)
Anion gap: 11 (ref 5–15)
BUN: 24 mg/dL — ABNORMAL HIGH (ref 8–23)
CO2: 20 mmol/L — ABNORMAL LOW (ref 22–32)
Calcium: 8.5 mg/dL — ABNORMAL LOW (ref 8.9–10.3)
Chloride: 106 mmol/L (ref 98–111)
Creatinine, Ser: 1.47 mg/dL — ABNORMAL HIGH (ref 0.61–1.24)
GFR, Estimated: 52 mL/min — ABNORMAL LOW (ref >60)
Glucose, Bld: 111 mg/dL — ABNORMAL HIGH (ref 70–99)
Potassium: 4.5 mmol/L (ref 3.5–5.1)
Sodium: 137 mmol/L (ref 135–145)
Total Bilirubin: 1.1 mg/dL (ref 0.3–1.2)
Total Protein: 6.4 g/dL — ABNORMAL LOW (ref 6.5–8.1)

## 2020-01-23 LAB — HIV ANTIBODY (ROUTINE TESTING W REFLEX): HIV Screen 4th Generation wRfx: NONREACTIVE

## 2020-01-23 LAB — GLUCOSE, CAPILLARY
Glucose-Capillary: 102 mg/dL — ABNORMAL HIGH (ref 70–99)
Glucose-Capillary: 107 mg/dL — ABNORMAL HIGH (ref 70–99)
Glucose-Capillary: 89 mg/dL (ref 70–99)
Glucose-Capillary: 93 mg/dL (ref 70–99)

## 2020-01-23 MED ORDER — ACETAMINOPHEN 325 MG PO TABS: 650.0000 mg | ORAL_TABLET | Freq: Four times a day (QID) | ORAL | Status: DC | PRN

## 2020-01-23 MED ORDER — ACETAMINOPHEN 650 MG RE SUPP: 650.0000 mg | Freq: Four times a day (QID) | RECTAL | Status: DC | PRN

## 2020-01-23 MED ORDER — METOPROLOL TARTRATE 5 MG/5ML IV SOLN
2.5000 mg | Freq: Four times a day (QID) | INTRAVENOUS | Status: DC
  Administered 2020-01-23 – 2020-01-27 (×17): 2.5 mg via INTRAVENOUS
  Filled 2020-01-23 (×17): qty 5

## 2020-01-23 MED ORDER — SODIUM CHLORIDE 0.9 % IV SOLN
2.0000 g | INTRAVENOUS | Status: DC
  Administered 2020-01-23 – 2020-01-26 (×4): 2 g via INTRAVENOUS
  Filled 2020-01-23: qty 2
  Filled 2020-01-23: qty 20
  Filled 2020-01-23 (×2): qty 2

## 2020-01-23 MED ORDER — METRONIDAZOLE IN NACL 5-0.79 MG/ML-% IV SOLN
500.0000 mg | Freq: Once | INTRAVENOUS | Status: AC
  Administered 2020-01-23: 500 mg via INTRAVENOUS
  Filled 2020-01-23: qty 100

## 2020-01-23 MED ORDER — MORPHINE SULFATE (PF) 2 MG/ML IV SOLN
1.0000 mg | INTRAVENOUS | Status: DC | PRN
  Administered 2020-01-24 – 2020-01-26 (×11): 1 mg via INTRAVENOUS
  Filled 2020-01-23 (×11): qty 1

## 2020-01-23 MED ORDER — SODIUM CHLORIDE 0.9 % IV SOLN
2.0000 g | Freq: Once | INTRAVENOUS | Status: AC
  Administered 2020-01-23: 2 g via INTRAVENOUS
  Filled 2020-01-23: qty 2

## 2020-01-23 MED ORDER — DEXTROSE-NACL 5-0.9 % IV SOLN: INTRAVENOUS | Status: AC

## 2020-01-23 MED ORDER — METRONIDAZOLE IN NACL 5-0.79 MG/ML-% IV SOLN
500.0000 mg | Freq: Three times a day (TID) | INTRAVENOUS | Status: DC
  Administered 2020-01-23 – 2020-01-26 (×10): 500 mg via INTRAVENOUS
  Filled 2020-01-23 (×10): qty 100

## 2020-01-23 NOTE — Consult Note (Addendum)
CC: Diverticulitis  Requesting provider: Dr. Florina Ou  HPI: Daniel Aguirre is an 71 y.o. male with hx of CKD III, HTN, HLD, aortic insuff, afib (on Eliquis) presented to ED with 4d hx of LLQ cramps and some diarrhea. Reports similar attack just after Thanksgiving that kept him out of work for 2 days - fever/chills and nausea. Thought it was a GI bug. This go-round has had 4d hx of crampy abdominal pain, diarrhea and minimal nausea. No emesis. No radiation. Nothing makes it better/worse  Feeling better today than the last 2 days. Having loose stools. Denies nausea/vomiting  Past Medical History:  Diagnosis Date  . CKD (chronic kidney disease), stage III ([place])   . Diastolic dysfunction    a. 09/2015 Echo: EF 65-70%, no rwma, Gr1 DD, Mod AI. Asc AO 35mm. Mildly dil Asc Ao.  LA 76mm.  Marland Kitchen Dyslipidemia   . Hypertension   . Kidney stones   . Moderate aortic insufficiency    a. 09/2015 Echo: Mod AI.  Marland Kitchen Right bundle branch block   . Testicular hypofunction     Past Surgical History:  Procedure Laterality Date  . CHOLECYSTECTOMY    . MOHS SURGERY      Family History  Problem Relation Age of Onset  . Colon cancer Mother 33  . AAA (abdominal aortic aneurysm) Father 62  . Stroke Father   . Heart attack Brother 40       presumed MI - SCD  . Stroke Brother 57    Social:  reports that he has never smoked. He has never used smokeless tobacco. He reports current alcohol use. He reports that he does not use drugs.  Allergies:  Allergies  Allergen Reactions  . Amlodipine Palpitations  . Lisinopril Other (See Comments)    Elevated  creatinine   . Penicillins Other (See Comments)    Childhood reaction     Medications: I have reviewed the patient's current medications.  Results for orders placed or performed during the hospital encounter of 01/22/20 (from the past 48 hour(s))  Lipase, blood     Status: None   Collection Time: 01/22/20  9:29 PM  Result Value Ref Range   Lipase 34  11 - 51 U/L    Comment: Performed at [facility], [place] [address] [place], [place] [address]  Comprehensive metabolic panel     Status: Abnormal   Collection Time: 01/22/20  9:29 PM  Result Value Ref Range   Sodium 139 135 - 145 mmol/L   Potassium 4.2 3.5 - 5.1 mmol/L   Chloride 107 98 - 111 mmol/L   CO2 19 (L) 22 - 32 mmol/L   Glucose, Bld 94 70 - 99 mg/dL    Comment: Glucose reference range applies only to samples taken after fasting for at least 8 hours.   BUN 23 8 - 23 mg/dL   Creatinine, Ser 1.58 (H) 0.61 - 1.24 mg/dL   Calcium 9.1 8.9 - 10.3 mg/dL   Total Protein 7.6 6.5 - 8.1 g/dL   Albumin 3.9 3.5 - 5.0 g/dL   AST 22 15 - 41 U/L   ALT 32 0 - 44 U/L   Alkaline Phosphatase 71 38 - 126 U/L   Total Bilirubin 1.3 (H) 0.3 - 1.2 mg/dL   GFR, Estimated 48 (L) >60 mL/min    Comment: (NOTE) Calculated using the CKD-EPI Creatinine Equation (2021)    Anion gap 13 5 - 15    Comment: Performed at [facility], [address], [place], [place] [address]  CBC     Status: None   Collection Time: 01/22/20  9:29 PM  Result Value Ref Range   WBC 8.8 4.0 - 10.5 K/uL   RBC 5.45 4.22 - 5.81 MIL/uL   Hemoglobin 16.9 13.0 - 17.0 g/dL   HCT 26.3 33.5 - 45.6 %   MCV 91.4 80.0 - 100.0 fL   MCH 31.0 26.0 - 34.0 pg   MCHC 33.9 30.0 - 36.0 g/dL   RDW 25.6 38.9 - 37.3 %   Platelets 197 150 - 400 K/uL   nRBC 0.0 0.0 - 0.2 %    Comment: Performed at [facility], [address] [place], [place] [address]  Urinalysis, Routine w reflex microscopic Urine, Clean Catch     Status: Abnormal   Collection Time: 01/22/20  9:29 PM  Result Value Ref Range   Color, Urine AMBER (A) YELLOW    Comment: BIOCHEMICALS MAY BE AFFECTED BY COLOR   APPearance CLEAR CLEAR   Specific Gravity, Urine 1.039 (H) 1.005 - 1.030   pH 5.0 5.0 - 8.0   Glucose, UA NEGATIVE NEGATIVE mg/dL   Hgb urine dipstick NEGATIVE NEGATIVE   Bilirubin Urine NEGATIVE NEGATIVE    Ketones, ur 20 (A) NEGATIVE mg/dL   Protein, ur 811 (A) NEGATIVE mg/dL   Nitrite NEGATIVE NEGATIVE   Leukocytes,Ua NEGATIVE NEGATIVE   RBC / HPF 0-5 0 - 5 RBC/hpf   WBC, UA 0-5 0 - 5 WBC/hpf   Bacteria, UA RARE (A) NONE SEEN   Mucus PRESENT     Comment: Performed at [facility], [address] [place], [place] [address]  Resp Panel by RT-PCR (Flu A&B, Covid) Nasopharyngeal Swab     Status: None   Collection Time: 01/23/20  1:31 AM   Specimen: Nasopharyngeal Swab; Nasopharyngeal(NP) swabs in vial transport medium  Result Value Ref Range   SARS Coronavirus 2 by RT PCR NEGATIVE NEGATIVE    Comment: (NOTE) SARS-CoV-2 target nucleic acids are NOT DETECTED.  The SARS-CoV-2 RNA is generally detectable in upper respiratory specimens during the acute phase of infection. The lowest concentration of SARS-CoV-2 viral copies this assay can detect is 138 copies/mL. A negative result does not preclude SARS-Cov-2 infection and should not be used as the sole basis for treatment or other patient management decisions. A negative result may occur with  improper specimen collection/handling, submission of specimen other than nasopharyngeal swab, presence of viral mutation(s) within the areas targeted by this assay, and inadequate number of viral copies(<138 copies/mL). A negative result must be combined with clinical observations, patient history, and epidemiological information. The expected result is Negative.  Fact Sheet for Patients:  BloggerCourse.com  Fact Sheet for Healthcare Providers:  SeriousBroker.it  This test is no t yet approved or cleared by the [place] FDA and  has been authorized for detection and/or diagnosis of SARS-CoV-2 by FDA under an Emergency Use Authorization (EUA). This EUA will remain  in effect (meaning this test can be used) for the duration of the COVID-19 declaration under Section 564(b)(1)  of the Act, 21 U.S.C.section 360bbb-3(b)(1), unless the authorization is terminated  or revoked sooner.       Influenza A by PCR NEGATIVE NEGATIVE   Influenza B by PCR NEGATIVE NEGATIVE    Comment: (NOTE) The Xpert Xpress SARS-CoV-2/FLU/RSV plus assay is intended as an aid in the diagnosis of influenza from Nasopharyngeal swab specimens and should not be used as a  sole basis for treatment. Nasal washings and aspirates are unacceptable for Xpert Xpress SARS-CoV-2/FLU/RSV testing.  Fact Sheet for Patients: BloggerCourse.comhttps://www.fda.gov/media/152166/download  Fact Sheet for Healthcare Providers: SeriousBroker.ithttps://www.fda.gov/media/152162/download  This test is not yet approved or cleared by the [place] FDA and has been authorized for detection and/or diagnosis of SARS-CoV-2 by FDA under an Emergency Use Authorization (EUA). This EUA will remain in effect (meaning this test can be used) for the duration of the COVID-19 declaration under Section 564(b)(1) of the Act, 21 U.S.C. section 360bbb-3(b)(1), unless the authorization is terminated or revoked.  Performed at [facility], [address] [place], [place] [address]   Glucose, capillary     Status: Abnormal   Collection Time: 01/23/20  6:04 AM  Result Value Ref Range   Glucose-Capillary 102 (H) 70 - 99 mg/dL    Comment: Glucose reference range applies only to samples taken after fasting for at least 8 hours.   Comment 1 Notify RN    Comment 2 Document in Chart   CBC     Status: None   Collection Time: 01/23/20  6:30 AM  Result Value Ref Range   WBC 8.0 4.0 - 10.5 K/uL   RBC 4.63 4.22 - 5.81 MIL/uL   Hemoglobin 14.4 13.0 - 17.0 g/dL   HCT 60.443.1 54.039.0 - 98.152.0 %   MCV 93.1 80.0 - 100.0 fL   MCH 31.1 26.0 - 34.0 pg   MCHC 33.4 30.0 - 36.0 g/dL   RDW 19.113.7 47.811.5 - 29.515.5 %   Platelets 176 150 - 400 K/uL   nRBC 0.0 0.0 - 0.2 %    Comment: Performed at [facility], [address] [place], [place]  [id number]    CT ABDOMEN PELVIS W CONTRAST  Result Date: 01/23/2020 CLINICAL DATA:  Diarrhea EXAM: CT ABDOMEN AND PELVIS WITH CONTRAST TECHNIQUE: Multidetector CT imaging of the abdomen and pelvis was performed using the standard protocol following bolus administration of intravenous contrast. CONTRAST:  100mL OMNIPAQUE IOHEXOL 300 MG/ML  SOLN COMPARISON:  None. FINDINGS: Lower chest: The visualized lung bases are clear bilaterally. The visualized heart and pericardium are unremarkable. Hepatobiliary: Cholecystectomy has been performed. Liver unremarkable. No intra or extrahepatic biliary ductal dilation. Pancreas: Unremarkable Spleen: Unremarkable Adrenals/Urinary Tract: The adrenal glands are unremarkable. 3 mm nonobstructing calculus noted within the upper pole of the left kidney. Simple cortical cyst noted within the left kidney. The kidneys are otherwise unremarkable. Bladder is unremarkable. Stomach/Bowel: There is severe descending and sigmoid diverticulosis. There is superimposed circumferential bowel wall thickening involving the mid sigmoid colon with pericolonic inflammatory stranding in keeping with changes of perforated sigmoid diverticulitis. There is an adjacent pericolonic abscess within the sigmoid colonic mesentery measuring 2.2 x 3.0 x 4.2 cm on axial image # 58 and coronal image # 73. The abscess is in close proximity to multiple loops of small bowel which demonstrate hyperemia, likely related to the adjacent inflammatory process. This is most evident on axial image # 56 and coronal image # 75. There is a resultant high-grade partial small bowel obstruction with multiple fluid-filled dilated loops of small bowel proximally. There is mild ascites. No free intraperitoneal gas. The appendix is normal. Vascular/Lymphatic: No significant vascular findings are present. No enlarged abdominal or pelvic lymph nodes. Reproductive: Prostate is unremarkable. Other: No abdominal wall hernia. Musculoskeletal:  No acute bone abnormality. No lytic or blastic bone lesion. IMPRESSION: Perforated sigmoid diverticulitis with 4.2 cm abscess within the small bowel mesentery. Extensive small-bowel inflammation adjacent  to the a diverticular abscess with resultant high-grade partial small bowel obstruction involving the distal small bowel. Extensive hyperemia of the distal small bowel, likely reactive to the adjacent inflammatory process within the mesentery. Background severe descending and sigmoid colonic diverticulosis. Electronically Signed   By: Helyn Numbers MD   On: 01/23/2020 00:39    ROS - all of the below systems have been reviewed with the patient and positives are indicated with bold text General: chills, fever or night sweats Eyes: blurry vision or double vision ENT: epistaxis or sore throat Allergy/Immunology: itchy/watery eyes or nasal congestion Hematologic/Lymphatic: bleeding problems, blood clots or swollen lymph nodes Endocrine: temperature intolerance or unexpected weight changes Breast: new or changing breast lumps or nipple discharge Resp: cough, shortness of breath, or wheezing CV: chest pain or dyspnea on exertion GI: as per HPI GU: dysuria, trouble voiding, or hematuria MSK: joint pain or joint stiffness Neuro: TIA or stroke symptoms Derm: pruritus and skin lesion changes Psych: anxiety and depression  PE Blood pressure (!) 160/90, pulse 90, temperature 97.8 F (36.6 C), temperature source Oral, resp. rate 16, height 5\' 8"  (1.727 m), weight 81 kg, SpO2 97 %. Constitutional: NAD; conversant; no deformities Eyes: Moist conjunctiva; no lid lag; anicteric; PERRL Neck: Trachea midline; no thyromegaly Lungs: Normal respiratory effort; no tactile fremitus CV: RRR; no palpable thrills; no pitting edema GI: Abd soft, minimally ttp in hypogastrium, no rebound nor guarding; mildly distended; no palpable hepatosplenomegaly MSK: Normal range of motion of extremities; no  clubbing/cyanosis Psychiatric: Appropriate affect; alert and oriented x3 Lymphatic: No palpable cervical or axillary lymphadenopathy  Results for orders placed or performed during the hospital encounter of 01/22/20 (from the past 48 hour(s))  Lipase, blood     Status: None   Collection Time: 01/22/20  9:29 PM  Result Value Ref Range   Lipase 34 11 - 51 U/L    Comment: Performed at [facility], [address] [place], [place]  Comprehensive metabolic panel     Status: Abnormal   Collection Time: 01/22/20  9:29 PM  Result Value Ref Range   Sodium 139 135 - 145 mmol/L   Potassium 4.2 3.5 - 5.1 mmol/L   Chloride 107 98 - 111 mmol/L   CO2 19 (L) 22 - 32 mmol/L   Glucose, Bld 94 70 - 99 mg/dL    Comment: Glucose reference range applies only to samples taken after fasting for at least 8 hours.   BUN 23 8 - 23 mg/dL   Creatinine, Ser 03/21/20 (H) 0.61 - 1.24 mg/dL   Calcium 9.1 8.9 - 6.43 mg/dL   Total Protein 7.6 6.5 - 8.1 g/dL   Albumin 3.9 3.5 - 5.0 g/dL   AST 22 15 - 41 U/L   ALT 32 0 - 44 U/L   Alkaline Phosphatase 71 38 - 126 U/L   Total Bilirubin 1.3 (H) 0.3 - 1.2 mg/dL   GFR, Estimated 48 (L) >60 mL/min    Comment: (NOTE) Calculated using the CKD-EPI Creatinine Equation (2021)    Anion gap 13 5 - 15    Comment: Performed at [facility], [address] [place], [place]  CBC     Status: None   Collection Time: 01/22/20  9:29 PM  Result Value Ref Range   WBC 8.8 4.0 - 10.5 K/uL   RBC 5.45 4.22 - 5.81 MIL/uL   Hemoglobin 16.9 13.0 - 17.0 g/dL   HCT 03/21/20 16.6 - 06.3 %  MCV 91.4 80.0 - 100.0 fL   MCH 31.0 26.0 - 34.0 pg   MCHC 33.9 30.0 - 36.0 g/dL   RDW 16.113.7 09.611.5 - 04.515.5 %   Platelets 197 150 - 400 K/uL   nRBC 0.0 0.0 - 0.2 %    Comment: Performed at [facility], [address] [place], [place] [address]  Urinalysis, Routine w reflex microscopic Urine, Clean Catch     Status: Abnormal    Collection Time: 01/22/20  9:29 PM  Result Value Ref Range   Color, Urine AMBER (A) YELLOW    Comment: BIOCHEMICALS MAY BE AFFECTED BY COLOR   APPearance CLEAR CLEAR   Specific Gravity, Urine 1.039 (H) 1.005 - 1.030   pH 5.0 5.0 - 8.0   Glucose, UA NEGATIVE NEGATIVE mg/dL   Hgb urine dipstick NEGATIVE NEGATIVE   Bilirubin Urine NEGATIVE NEGATIVE   Ketones, ur 20 (A) NEGATIVE mg/dL   Protein, ur 191100 (A) NEGATIVE mg/dL   Nitrite NEGATIVE NEGATIVE   Leukocytes,Ua NEGATIVE NEGATIVE   RBC / HPF 0-5 0 - 5 RBC/hpf   WBC, UA 0-5 0 - 5 WBC/hpf   Bacteria, UA RARE (A) NONE SEEN   Mucus PRESENT     Comment: Performed at [facility], [address] [place], [place] [address]  Resp Panel by RT-PCR (Flu A&B, Covid) Nasopharyngeal Swab     Status: None   Collection Time: 01/23/20  1:31 AM   Specimen: Nasopharyngeal Swab; Nasopharyngeal(NP) swabs in vial transport medium  Result Value Ref Range   SARS Coronavirus 2 by RT PCR NEGATIVE NEGATIVE    Comment: (NOTE) SARS-CoV-2 target nucleic acids are NOT DETECTED.  The SARS-CoV-2 RNA is generally detectable in upper respiratory specimens during the acute phase of infection. The lowest concentration of SARS-CoV-2 viral copies this assay can detect is 138 copies/mL. A negative result does not preclude SARS-Cov-2 infection and should not be used as the sole basis for treatment or other patient management decisions. A negative result may occur with  improper specimen collection/handling, submission of specimen other than nasopharyngeal swab, presence of viral mutation(s) within the areas targeted by this assay, and inadequate number of viral copies(<138 copies/mL). A negative result must be combined with clinical observations, patient history, and epidemiological information. The expected result is Negative.  Fact Sheet for Patients:  BloggerCourse.comhttps://www.fda.gov/media/152166/download  Fact Sheet for Healthcare Providers:   SeriousBroker.ithttps://www.fda.gov/media/152162/download  This test is no t yet approved or cleared by the [place] FDA and  has been authorized for detection and/or diagnosis of SARS-CoV-2 by FDA under an Emergency Use Authorization (EUA). This EUA will remain  in effect (meaning this test can be used) for the duration of the COVID-19 declaration under Section 564(b)(1) of the Act, 21 U.S.C.section 360bbb-3(b)(1), unless the authorization is terminated  or revoked sooner.       Influenza A by PCR NEGATIVE NEGATIVE   Influenza B by PCR NEGATIVE NEGATIVE    Comment: (NOTE) The Xpert Xpress SARS-CoV-2/FLU/RSV plus assay is intended as an aid in the diagnosis of influenza from Nasopharyngeal swab specimens and should not be used as a sole basis for treatment. Nasal washings and aspirates are unacceptable for Xpert Xpress SARS-CoV-2/FLU/RSV testing.  Fact Sheet for Patients: BloggerCourse.comhttps://www.fda.gov/media/152166/download  Fact Sheet for Healthcare Providers: SeriousBroker.ithttps://www.fda.gov/media/152162/download  This test is not yet approved or cleared by the [place] FDA and has been authorized for detection and/or diagnosis of SARS-CoV-2 by FDA under an Emergency Use Authorization (EUA). This EUA will  remain in effect (meaning this test can be used) for the duration of the COVID-19 declaration under Section 564(b)(1) of the Act, 21 U.S.C. section 360bbb-3(b)(1), unless the authorization is terminated or revoked.  Performed at [facility], [address], [place], [place] [address]   Glucose, capillary     Status: Abnormal   Collection Time: 01/23/20  6:04 AM  Result Value Ref Range   Glucose-Capillary 102 (H) 70 - 99 mg/dL    Comment: Glucose reference range applies only to samples taken after fasting for at least 8 hours.   Comment 1 Notify RN    Comment 2 Document in Chart   CBC     Status: None   Collection Time: 01/23/20  6:30 AM  Result Value Ref Range   WBC 8.0 4.0  - 10.5 K/uL   RBC 4.63 4.22 - 5.81 MIL/uL   Hemoglobin 14.4 13.0 - 17.0 g/dL   HCT 65.7 84.6 - 96.2 %   MCV 93.1 80.0 - 100.0 fL   MCH 31.1 26.0 - 34.0 pg   MCHC 33.4 30.0 - 36.0 g/dL   RDW 95.2 84.1 - 32.4 %   Platelets 176 150 - 400 K/uL   nRBC 0.0 0.0 - 0.2 %    Comment: Performed at [facility] [address], [place], [place] [address]    CT ABDOMEN PELVIS W CONTRAST  Result Date: 01/23/2020 CLINICAL DATA:  Diarrhea EXAM: CT ABDOMEN AND PELVIS WITH CONTRAST TECHNIQUE: Multidetector CT imaging of the abdomen and pelvis was performed using the standard protocol following bolus administration of intravenous contrast. CONTRAST:  OMNIPAQUE IOHEXOL 300 MG/ML  SOLN COMPARISON:  None. FINDINGS: Lower chest: The visualized lung bases are clear bilaterally. The visualized heart and pericardium are unremarkable. Hepatobiliary: Cholecystectomy has been performed. Liver unremarkable. No intra or extrahepatic biliary ductal dilation. Pancreas: Unremarkable Spleen: Unremarkable Adrenals/Urinary Tract: The adrenal glands are unremarkable. 3 mm nonobstructing calculus noted within the upper pole of the left kidney. Simple cortical cyst noted within the left kidney. The kidneys are otherwise unremarkable. Bladder is unremarkable. Stomach/Bowel: There is severe descending and sigmoid diverticulosis. There is superimposed circumferential bowel wall thickening involving the mid sigmoid colon with pericolonic inflammatory stranding in keeping with changes of perforated sigmoid diverticulitis. There is an adjacent pericolonic abscess within the sigmoid colonic mesentery measuring 2.2 x 3.0 x 4.2 cm on axial image # 58 and coronal image # 73. The abscess is in close proximity to multiple loops of small bowel which demonstrate hyperemia, likely related to the adjacent inflammatory process. This is most evident on axial image # 56 and coronal image # 75. There is a resultant high-grade partial  small bowel obstruction with multiple fluid-filled dilated loops of small bowel proximally. There is mild ascites. No free intraperitoneal gas. The appendix is normal. Vascular/Lymphatic: No significant vascular findings are present. No enlarged abdominal or pelvic lymph nodes. Reproductive: Prostate is unremarkable. Other: No abdominal wall hernia. Musculoskeletal: No acute bone abnormality. No lytic or blastic bone lesion. IMPRESSION: Perforated sigmoid diverticulitis with 4.2 cm abscess within the small bowel mesentery. Extensive small-bowel inflammation adjacent to the a diverticular abscess with resultant high-grade partial small bowel obstruction involving the distal small bowel. Extensive hyperemia of the distal small bowel, likely reactive to the adjacent inflammatory process within the mesentery. Background severe descending and sigmoid colonic diverticulosis. Electronically Signed   By: Helyn Numbers MD   On: 01/23/2020 00:39   We spent time reviewing his CT  scan  A/P: Daniel Aguirre is an 70 y.o. male with CKD III, HTN, HLD, aortic insuff, afib (on Eliquis) - here with diverticulitis and abscess - located more centrally in small bowel mesentery  -NPO, MIVF -IV Zosyn -IR consult was placed - may not have window - will wait and see -Hold Eliquis; ok for heparin gtt from our perspective -Can hold off on NG placement as he has had no emesis and is having multiple BMs - likely underlying 'ileus' not obstruction - this will hopefully improve over coming days as inflammation settles down. If things aren't improving, potential for surgery. We spent time today reviewing his diagnosis, pathophysiology of diverticulitis, treatment approach and long-term solutions as well. We will see how things go with IV antibiotics -Personal hx of polyps - follows with [facility] - we do not have records of last scope but will need to obtain this  Marin Olp, MD [facility] Use  [email or web address] to contact on call provider

## 2020-01-23 NOTE — Progress Notes (Signed)
Seen and examined and agree with admitting physicians plan as per overnight  70 white male HTN HLD CKD 3 paroxysmal A. fib [admitted 12/2017 for the same] Recent abdominal pain around Thanksgiving with fever chills nausea Re-Developed lower quadrant pain over several days  Came to emergency room and CT scan showed sigmoid diverticulitis and central bowel mesentery with perforation General surgery Dr. Cliffton Asters interventional radiology consulted--- it is not felt that this area is amenable to IR drain Tells me last colonoscopy was 5 years ago with no polyps-initial colonoscopy (done at age 22 as mother has history of colorectal CA) showed 11 polyps  BP (!) 160/90   Pulse 90   Temp 97.8 F (36.6 C) (Oral)   Resp 16   Ht 5\' 8"  (1.727 m)   Wt 81 kg   SpO2 97%   BMI 27.15 kg/m  Awake alert coherent no distress EOMI NCAT Chest clear no added sound rales rhonchi Abdomen tender with slight distention discomfort ROM intact Neurologically intact   Plan  Holding Eliquis--start heparin tomorrow if no immediate plan for surgery--keep on monitors today Defer to general surgery and IR next steps and imaging--main symptoms are diarrhea--hold NG unless vomiting Continue cefepime Flagyl, morphine 1 mg for pain D5 NS 75 cc/H  , MD [facility] 11:05 AM

## 2020-01-23 NOTE — Progress Notes (Signed)
70 year old caucasian male admitted for abdominal pain and diagnosed with perforated diverticulitis. Patient has a hx of CKD stage 3, HTN, dyslipidemia and kidney stones.  Patient is alert and oriented. Up ad lib.  IV fluids infusing into right forearm PIV. No problem noted to site. Patient denies being in any pain. Patient currently NPO for possible surgery today. Patient oriented to unit. Denies having any other questions or concerns. No other complaints or signs of distress noted. Patient laying in bed resting quietly. Breaths unlabored. Side rails up x2. Bed in lowest position. Call light within reach.

## 2020-01-23 NOTE — H&P (Signed)
History and Physical    Daniel Aguirre [id number] DOB: May 30, 1950 DOA: 01/22/2020  PCP: Daisy Floro, MD  Patient coming from: Home.  Chief Complaint: Abdominal pain.  HPI: Daniel Aguirre is a 70 y.o. male with history of atrial fibrillation, hypertension, chronic kidney disease stage III presents to the ER because of abdominal pain ongoing for last 4 days.  Pain is mostly left lower quadrant has had subjective feeling of fever chills.  Denies vomiting or diarrhea.  Due to worsening pain patient presents to the ER.  ED Course: In the ER patient is not febrile or hypoxic CT abdomen pelvis shows features concerning for sigmoid diverticulitis with perforation and abscess and possible small bowel obstruction.  On-call general surgeon Dr. Cliffton Asters was consulted requested starting antibiotic IR consulted to be seeing patient in consult.  Not to place NG tube unless patient vomits.  Patient was started on empiric antibiotics.  Review of Systems: As per HPI, rest all negative.   Past Medical History:  Diagnosis Date  . CKD (chronic kidney disease), stage III (HCC)   . Diastolic dysfunction    a. 09/2015 Echo: EF 65-70%, no rwma, Gr1 DD, Mod AI. Asc AO 64mm. Mildly dil Asc Ao.  LA 47mm.  Marland Kitchen Dyslipidemia   . Hypertension   . Kidney stones   . Moderate aortic insufficiency    a. 09/2015 Echo: Mod AI.  Marland Kitchen Right bundle branch block   . Testicular hypofunction     Past Surgical History:  Procedure Laterality Date  . CHOLECYSTECTOMY    . MOHS SURGERY       reports that he has never smoked. He has never used smokeless tobacco. He reports current alcohol use. He reports that he does not use drugs.  Allergies  Allergen Reactions  . Amlodipine Palpitations  . Lisinopril Other (See Comments)    Elevated  creatinine   . Penicillins Other (See Comments)    Childhood reaction     Family History  Problem Relation Age of Onset  . Colon cancer Mother 68  . AAA (abdominal aortic  aneurysm) Father 76  . Stroke Father   . Heart attack Brother 72       presumed MI - SCD  . Stroke Brother 57    Prior to Admission medications   Medication Sig Start Date End Date Taking? Authorizing Provider  acetaminophen (TYLENOL 8 HOUR ARTHRITIS PAIN) 650 MG CR tablet Take 1,300 mg by mouth in the morning, at noon, and at bedtime.   Yes [provider]  Cholecalciferol (VITAMIN D3) 125 MCG (5000 UT) CAPS Take 1 capsule by mouth daily. 04/21/19  Yes [provider]  diltiazem (CARDIZEM CD) 240 MG 24 hr capsule Take 1 capsule (240 mg total) by mouth daily. 09/29/19  Yes Rollene Rotunda, MD  ELIQUIS 5 MG TABS tablet TAKE 1 TABLET BY MOUTH TWICE A DAY 04/12/18  Yes Rollene Rotunda, MD  metoprolol tartrate (LOPRESSOR) 25 MG tablet Take 25 mg by mouth 2 (two) times daily.   Yes [provider]  tamsulosin (FLOMAX) 0.4 MG CAPS capsule Take 0.4 mg by mouth daily. 10/04/19  Yes [provider]  testosterone (ANDROGEL) 50 MG/5GM (1%) GEL APPLY 1 TUBE ONCE A DAY AS DIRECTED 04/08/18  Yes [provider]    Physical Exam: Constitutional: Moderately built and nourished. Vitals:   01/22/20 2130 01/23/20 0000 01/23/20 0030 01/23/20 0100  BP: (!) 149/92 129/88 138/81 132/84  Pulse: (!) 103 78 80 76  Resp: 14 15  14   Temp:      TempSrc:      SpO2: 98% 97% 98% 95%   Eyes: Anicteric no pallor. ENMT: No discharge from the ears eyes nose or mouth. Neck: No mass felt.  No neck rigidity. Respiratory: No rhonchi or crepitations. Cardiovascular: S1-S2 heard. Abdomen: Tenderness in left lower quadrant no guarding no rigidity. Musculoskeletal: No edema. Skin: No rash. Neurologic: Alert awake oriented to time place and person.  Moves all extremities. Psychiatric: Appears normal.  Normal affect.   Labs on Admission: I have personally reviewed following labs and imaging studies  CBC: Recent Labs  Lab 01/22/20 2129  WBC 8.8  HGB 16.9  HCT 49.8  MCV 91.4   PLT 197   Basic Metabolic Panel: Recent Labs  Lab 01/22/20 2129  NA 139  K 4.2  CL 107  CO2 19*  GLUCOSE 94  BUN 23  CREATININE 1.58*  CALCIUM 9.1   GFR: CrCl cannot be calculated (Unknown ideal weight.). Liver Function Tests: Recent Labs  Lab 01/22/20 2129  AST 22  ALT 32  ALKPHOS 71  BILITOT 1.3*  PROT 7.6  ALBUMIN 3.9   Recent Labs  Lab 01/22/20 2129  LIPASE 34   No results for input(s): AMMONIA in the last 168 hours. Coagulation Profile: No results for input(s): INR, PROTIME in the last 168 hours. Cardiac Enzymes: No results for input(s): CKTOTAL, CKMB, CKMBINDEX, TROPONINI in the last 168 hours. BNP (last 3 results) No results for input(s): PROBNP in the last 8760 hours. HbA1C: No results for input(s): HGBA1C in the last 72 hours. CBG: No results for input(s): GLUCAP in the last 168 hours. Lipid Profile: No results for input(s): CHOL, HDL, LDLCALC, TRIG, CHOLHDL, LDLDIRECT in the last 72 hours. Thyroid Function Tests: No results for input(s): TSH, T4TOTAL, FREET4, T3FREE, THYROIDAB in the last 72 hours. Anemia Panel: No results for input(s): VITAMINB12, FOLATE, FERRITIN, TIBC, IRON, RETICCTPCT in the last 72 hours. Urine analysis:    Component Value Date/Time   COLORURINE AMBER (A) 01/22/2020 2129   APPEARANCEUR CLEAR 01/22/2020 2129   LABSPEC 1.039 (H) 01/22/2020 2129   PHURINE 5.0 01/22/2020 2129   GLUCOSEU NEGATIVE 01/22/2020 2129   HGBUR NEGATIVE 01/22/2020 2129   BILIRUBINUR NEGATIVE 01/22/2020 2129   KETONESUR 20 (A) 01/22/2020 2129   PROTEINUR 100 (A) 01/22/2020 2129   NITRITE NEGATIVE 01/22/2020 2129   LEUKOCYTESUR NEGATIVE 01/22/2020 2129   Sepsis Labs: @LABRCNTIP (procalcitonin:4,lacticidven:4) ) Recent Results (from the past 240 hour(s))  Resp Panel by RT-PCR (Flu A&B, Covid) Nasopharyngeal Swab     Status: None   Collection Time: 01/23/20  1:31 AM   Specimen: Nasopharyngeal Swab; Nasopharyngeal(NP) swabs in vial transport medium   Result Value Ref Range Status   SARS Coronavirus 2 by RT PCR NEGATIVE NEGATIVE Final    Comment: (NOTE) SARS-CoV-2 target nucleic acids are NOT DETECTED.  The SARS-CoV-2 RNA is generally detectable in upper respiratory specimens during the acute phase of infection. The lowest concentration of SARS-CoV-2 viral copies this assay can detect is 138 copies/mL. A negative result does not preclude SARS-Cov-2 infection and should not be used as the sole basis for treatment or other patient management decisions. A negative result may occur with  improper specimen collection/handling, submission of specimen other than nasopharyngeal swab, presence of viral mutation(s) within the areas targeted by this assay, and inadequate number of viral copies(<138 copies/mL). A negative result must be combined with clinical observations, patient history, and epidemiological information. The expected  result is Negative.  Fact Sheet for Patients:  BloggerCourse.com  Fact Sheet for Healthcare Providers:  SeriousBroker.it  This test is no t yet approved or cleared by the [place] FDA and  has been authorized for detection and/or diagnosis of SARS-CoV-2 by FDA under an Emergency Use Authorization (EUA). This EUA will remain  in effect (meaning this test can be used) for the duration of the COVID-19 declaration under Section 564(b)(1) of the Act, 21 U.S.C.section 360bbb-3(b)(1), unless the authorization is terminated  or revoked sooner.       Influenza A by PCR NEGATIVE NEGATIVE Final   Influenza B by PCR NEGATIVE NEGATIVE Final    Comment: (NOTE) The Xpert Xpress SARS-CoV-2/FLU/RSV plus assay is intended as an aid in the diagnosis of influenza from Nasopharyngeal swab specimens and should not be used as a sole basis for treatment. Nasal washings and aspirates are unacceptable for Xpert Xpress SARS-CoV-2/FLU/RSV testing.  Fact Sheet for  Patients: BloggerCourse.com  Fact Sheet for Healthcare Providers: SeriousBroker.it  This test is not yet approved or cleared by the [place] FDA and has been authorized for detection and/or diagnosis of SARS-CoV-2 by FDA under an Emergency Use Authorization (EUA). This EUA will remain in effect (meaning this test can be used) for the duration of the COVID-19 declaration under Section 564(b)(1) of the Act, 21 U.S.C. section 360bbb-3(b)(1), unless the authorization is terminated or revoked.  Performed at [facility], [address] [place], [place] [address]      Radiological Exams on Admission: CT ABDOMEN PELVIS W CONTRAST  Result Date: 01/23/2020 CLINICAL DATA:  Diarrhea EXAM: CT ABDOMEN AND PELVIS WITH CONTRAST TECHNIQUE: Multidetector CT imaging of the abdomen and pelvis was performed using the standard protocol following bolus administration of intravenous contrast. CONTRAST:  OMNIPAQUE IOHEXOL 300 MG/ML  SOLN COMPARISON:  None. FINDINGS: Lower chest: The visualized lung bases are clear bilaterally. The visualized heart and pericardium are unremarkable. Hepatobiliary: Cholecystectomy has been performed. Liver unremarkable. No intra or extrahepatic biliary ductal dilation. Pancreas: Unremarkable Spleen: Unremarkable Adrenals/Urinary Tract: The adrenal glands are unremarkable. 3 mm nonobstructing calculus noted within the upper pole of the left kidney. Simple cortical cyst noted within the left kidney. The kidneys are otherwise unremarkable. Bladder is unremarkable. Stomach/Bowel: There is severe descending and sigmoid diverticulosis. There is superimposed circumferential bowel wall thickening involving the mid sigmoid colon with pericolonic inflammatory stranding in keeping with changes of perforated sigmoid diverticulitis. There is an adjacent pericolonic abscess within the sigmoid colonic mesentery measuring 2.2  x 3.0 x 4.2 cm on axial image # 58 and coronal image # 73. The abscess is in close proximity to multiple loops of small bowel which demonstrate hyperemia, likely related to the adjacent inflammatory process. This is most evident on axial image # 56 and coronal image # 75. There is a resultant high-grade partial small bowel obstruction with multiple fluid-filled dilated loops of small bowel proximally. There is mild ascites. No free intraperitoneal gas. The appendix is normal. Vascular/Lymphatic: No significant vascular findings are present. No enlarged abdominal or pelvic lymph nodes. Reproductive: Prostate is unremarkable. Other: No abdominal wall hernia. Musculoskeletal: No acute bone abnormality. No lytic or blastic bone lesion. IMPRESSION: Perforated sigmoid diverticulitis with 4.2 cm abscess within the small bowel mesentery. Extensive small-bowel inflammation adjacent to the a diverticular abscess with resultant high-grade partial small bowel obstruction involving the distal small bowel. Extensive hyperemia of the distal small bowel, likely reactive to the adjacent inflammatory process within the mesentery.  Background severe descending and sigmoid colonic diverticulosis. Electronically Signed   By: Fidela Salisbury MD   On: 01/23/2020 00:39     Assessment/Plan Principal Problem:   Diverticulitis of large intestine with perforation and abscess without bleeding Active Problems:   A-fib (HCC)   HTN (hypertension)   CKD (chronic kidney disease) stage 3, GFR 30-59 ml/min (HCC)   Diverticulitis   Intra-abdominal abscess ([place])    1. Sigmoid diverticulitis with perforation and abscess with possible bowel obstruction for which patient is placed on empiric antibiotics IV fluids n.p.o. pain medication and general surgery has been consulted.  General surgery also has requested IR consult for possible drain placement. 2. A. fib rate controlled presently I have placed patient on scheduled dose of IV metoprolol.   Eliquis on hold in anticipation of possible procedure.  May start heparin if procedure is delayed or after the procedure is done today.  Last dose of Eliquis was last evening. 3. Hypertension dyspnea on scheduled dose of IV metoprolol. 4. Chronic kidney disease stage III creatinine appears to be at baseline.  Since patient has acute diverticulitis with perforation abscess and possible small bowel obstruction will need close monitoring for any further worsening in inpatient status.   DVT prophylaxis: SCDs. Code Status: Full code. Family Communication: Discussed with patient. Disposition Plan: Home. Consults called: General surgery. Admission status: Inpatient.   Rise Patience MD [facility] Pager [phone].  If 7PM-7AM, please contact night-coverage [email or web address] Password [email or web address]  01/23/2020, 2:30 AM

## 2020-01-23 NOTE — ED Notes (Signed)
ED TO INPATIENT HANDOFF REPORT  Name/Age/Gender Daniel Aguirre 70 y.o. male  Code Status    Code Status Orders  (From admission, onward)         Start     Ordered   01/23/20 0226  Full code  Continuous        01/23/20 0229        Code Status History    Date Active Date Inactive Code Status Order ID Comments User Context   12/20/2017 1534 12/21/2017 1746 Full Code [id number]  Burnadette Pop, MD Inpatient   Advance Care Planning Activity      Home/SNF/Other Home  Chief Complaint Diverticulitis [K57.92]  Level of Care/Admitting Diagnosis ED Disposition    ED Disposition Condition Comment   Admit  Hospital Area: [facility] [[id number]]  Level of Care: Telemetry [5]  Admit to tele based on following criteria: Monitor for Ischemic changes  May admit patient to [facility] or [facility] if equivalent level of care is available:: No  Covid Evaluation: Asymptomatic Screening Protocol (No Symptoms)  Diagnosis: Diverticulitis [209470]  Admitting Physician: Eduard Clos [phone]  Attending Physician: Eduard Clos [phone]  Estimated length of stay: past midnight tomorrow  Certification:: I certify this patient will need inpatient services for at least 2 midnights       Medical History Past Medical History:  Diagnosis Date  . CKD (chronic kidney disease), stage III (HCC)   . Diastolic dysfunction    a. 09/2015 Echo: EF 65-70%, no rwma, Gr1 DD, Mod AI. Asc AO 59mm. Mildly dil Asc Ao.  LA 45mm.  Marland Kitchen Dyslipidemia   . Hypertension   . Kidney stones   . Moderate aortic insufficiency    a. 09/2015 Echo: Mod AI.  Marland Kitchen Right bundle branch block   . Testicular hypofunction     Allergies Allergies  Allergen Reactions  . Amlodipine Palpitations  . Lisinopril Other (See Comments)    Elevated  creatinine   . Penicillins Other (See Comments)    Childhood reaction     IV Location/Drains/Wounds Patient Lines/Drains/Airways Status    Active  Line/Drains/Airways    Name Placement date Placement time Site Days   Peripheral IV 01/23/20 Anterior;Right;Medial Forearm 01/23/20  0000  Forearm  less than 1          Labs/Imaging Results for orders placed or performed during the hospital encounter of 01/22/20 (from the past 48 hour(s))  Lipase, blood     Status: None   Collection Time: 01/22/20  9:29 PM  Result Value Ref Range   Lipase 34 11 - 51 U/L    Comment: Performed at [facility], [address], [place], [place] [address]  Comprehensive metabolic panel     Status: Abnormal   Collection Time: 01/22/20  9:29 PM  Result Value Ref Range   Sodium 139 135 - 145 mmol/L   Potassium 4.2 3.5 - 5.1 mmol/L   Chloride 107 98 - 111 mmol/L   CO2 19 (L) 22 - 32 mmol/L   Glucose, Bld 94 70 - 99 mg/dL    Comment: Glucose reference range applies only to samples taken after fasting for at least 8 hours.   BUN 23 8 - 23 mg/dL   Creatinine, Ser 4.65 (H) 0.61 - 1.24 mg/dL   Calcium 9.1 8.9 - 03.5 mg/dL   Total Protein 7.6 6.5 - 8.1 g/dL   Albumin 3.9 3.5 - 5.0 g/dL   AST 22 15 - 41 U/L  ALT 32 0 - 44 U/L   Alkaline Phosphatase 71 38 - 126 U/L   Total Bilirubin 1.3 (H) 0.3 - 1.2 mg/dL   GFR, Estimated 48 (L) >60 mL/min    Comment: (NOTE) Calculated using the CKD-EPI Creatinine Equation (2021)    Anion gap 13 5 - 15    Comment: Performed at [facility], [place] [address] [place], [place] [address]  CBC     Status: None   Collection Time: 01/22/20  9:29 PM  Result Value Ref Range   WBC 8.8 4.0 - 10.5 K/uL   RBC 5.45 4.22 - 5.81 MIL/uL   Hemoglobin 16.9 13.0 - 17.0 g/dL   HCT 49.8 39.0 - 52.0 %   MCV 91.4 80.0 - 100.0 fL   MCH 31.0 26.0 - 34.0 pg   MCHC 33.9 30.0 - 36.0 g/dL   RDW 13.7 11.5 - 15.5 %   Platelets 197 150 - 400 K/uL   nRBC 0.0 0.0 - 0.2 %    Comment: Performed at [facility], [place] [address] [place], [place] [address]  Urinalysis, Routine w reflex  microscopic Urine, Clean Catch     Status: Abnormal   Collection Time: 01/22/20  9:29 PM  Result Value Ref Range   Color, Urine AMBER (A) YELLOW    Comment: BIOCHEMICALS MAY BE AFFECTED BY COLOR   APPearance CLEAR CLEAR   Specific Gravity, Urine 1.039 (H) 1.005 - 1.030   pH 5.0 5.0 - 8.0   Glucose, UA NEGATIVE NEGATIVE mg/dL   Hgb urine dipstick NEGATIVE NEGATIVE   Bilirubin Urine NEGATIVE NEGATIVE   Ketones, ur 20 (A) NEGATIVE mg/dL   Protein, ur 100 (A) NEGATIVE mg/dL   Nitrite NEGATIVE NEGATIVE   Leukocytes,Ua NEGATIVE NEGATIVE   RBC / HPF 0-5 0 - 5 RBC/hpf   WBC, UA 0-5 0 - 5 WBC/hpf   Bacteria, UA RARE (A) NONE SEEN   Mucus PRESENT     Comment: Performed at [facility], [place] [address] [place], [place] [address]  Resp Panel by RT-PCR (Flu A&B, Covid) Nasopharyngeal Swab     Status: None   Collection Time: 01/23/20  1:31 AM   Specimen: Nasopharyngeal Swab; Nasopharyngeal(NP) swabs in vial transport medium  Result Value Ref Range   SARS Coronavirus 2 by RT PCR NEGATIVE NEGATIVE    Comment: (NOTE) SARS-CoV-2 target nucleic acids are NOT DETECTED.  The SARS-CoV-2 RNA is generally detectable in upper respiratory specimens during the acute phase of infection. The lowest concentration of SARS-CoV-2 viral copies this assay can detect is 138 copies/mL. A negative result does not preclude SARS-Cov-2 infection and should not be used as the sole basis for treatment or other patient management decisions. A negative result may occur with  improper specimen collection/handling, submission of specimen other than nasopharyngeal swab, presence of viral mutation(s) within the areas targeted by this assay, and inadequate number of viral copies(<138 copies/mL). A negative result must be combined with clinical observations, patient history, and epidemiological information. The expected result is Negative.  Fact Sheet for Patients:   EntrepreneurPulse.com.au  Fact Sheet for Healthcare Providers:  IncredibleEmployment.be  This test is no t yet approved or cleared by the [place] FDA and  has been authorized for detection and/or diagnosis of SARS-CoV-2 by FDA under an Emergency Use Authorization (EUA). This EUA will remain  in effect (meaning this test can be used) for the duration of the COVID-19 declaration under Section 564(b)(1) of the Act, 21  U.S.C.section 360bbb-3(b)(1), unless the authorization is terminated  or revoked sooner.       Influenza A by PCR NEGATIVE NEGATIVE   Influenza B by PCR NEGATIVE NEGATIVE    Comment: (NOTE) The Xpert Xpress SARS-CoV-2/FLU/RSV plus assay is intended as an aid in the diagnosis of influenza from Nasopharyngeal swab specimens and should not be used as a sole basis for treatment. Nasal washings and aspirates are unacceptable for Xpert Xpress SARS-CoV-2/FLU/RSV testing.  Fact Sheet for Patients: BloggerCourse.com  Fact Sheet for Healthcare Providers: SeriousBroker.it  This test is not yet approved or cleared by the [place] FDA and has been authorized for detection and/or diagnosis of SARS-CoV-2 by FDA under an Emergency Use Authorization (EUA). This EUA will remain in effect (meaning this test can be used) for the duration of the COVID-19 declaration under Section 564(b)(1) of the Act, 21 U.S.C. section 360bbb-3(b)(1), unless the authorization is terminated or revoked.  Performed at [facility], [address] [place], [place] [address]    CT ABDOMEN PELVIS W CONTRAST  Result Date: 01/23/2020 CLINICAL DATA:  Diarrhea EXAM: CT ABDOMEN AND PELVIS WITH CONTRAST TECHNIQUE: Multidetector CT imaging of the abdomen and pelvis was performed using the standard protocol following bolus administration of intravenous contrast. CONTRAST:  OMNIPAQUE IOHEXOL  300 MG/ML  SOLN COMPARISON:  None. FINDINGS: Lower chest: The visualized lung bases are clear bilaterally. The visualized heart and pericardium are unremarkable. Hepatobiliary: Cholecystectomy has been performed. Liver unremarkable. No intra or extrahepatic biliary ductal dilation. Pancreas: Unremarkable Spleen: Unremarkable Adrenals/Urinary Tract: The adrenal glands are unremarkable. 3 mm nonobstructing calculus noted within the upper pole of the left kidney. Simple cortical cyst noted within the left kidney. The kidneys are otherwise unremarkable. Bladder is unremarkable. Stomach/Bowel: There is severe descending and sigmoid diverticulosis. There is superimposed circumferential bowel wall thickening involving the mid sigmoid colon with pericolonic inflammatory stranding in keeping with changes of perforated sigmoid diverticulitis. There is an adjacent pericolonic abscess within the sigmoid colonic mesentery measuring 2.2 x 3.0 x 4.2 cm on axial image # 58 and coronal image # 73. The abscess is in close proximity to multiple loops of small bowel which demonstrate hyperemia, likely related to the adjacent inflammatory process. This is most evident on axial image # 56 and coronal image # 75. There is a resultant high-grade partial small bowel obstruction with multiple fluid-filled dilated loops of small bowel proximally. There is mild ascites. No free intraperitoneal gas. The appendix is normal. Vascular/Lymphatic: No significant vascular findings are present. No enlarged abdominal or pelvic lymph nodes. Reproductive: Prostate is unremarkable. Other: No abdominal wall hernia. Musculoskeletal: No acute bone abnormality. No lytic or blastic bone lesion. IMPRESSION: Perforated sigmoid diverticulitis with 4.2 cm abscess within the small bowel mesentery. Extensive small-bowel inflammation adjacent to the a diverticular abscess with resultant high-grade partial small bowel obstruction involving the distal small bowel.  Extensive hyperemia of the distal small bowel, likely reactive to the adjacent inflammatory process within the mesentery. Background severe descending and sigmoid colonic diverticulosis. Electronically Signed   By: Helyn Numbers MD   On: 01/23/2020 00:39    Pending Labs Unresulted Labs (From admission, onward)          Start     Ordered   01/23/20 0500  Comprehensive metabolic panel  Tomorrow morning,   R        01/23/20 0229   01/23/20 0500  CBC  Tomorrow morning,   R  01/23/20 0229   01/23/20 0225  HIV Antibody (routine testing w rflx)  (HIV Antibody (Routine testing w reflex) panel)  Once,   STAT        01/23/20 0229          Vitals/Pain Today's Vitals   01/23/20 0000 01/23/20 0030 01/23/20 0100 01/23/20 0253  BP: 129/88 138/81 132/84 130/81  Pulse: 78 80 76 90  Resp: 15  14 15   Temp:      TempSrc:      SpO2: 97% 98% 95% 96%    Isolation Precautions No active isolations  Medications Medications  dextrose 5 %-0.9 % sodium chloride infusion ( Intravenous New Bag/Given 01/23/20 0253)  acetaminophen (TYLENOL) tablet 650 mg (has no administration in time range)    Or  acetaminophen (TYLENOL) suppository 650 mg (has no administration in time range)  morphine 2 MG/ML injection 1 mg (has no administration in time range)  metoprolol tartrate (LOPRESSOR) injection 2.5 mg (has no administration in time range)  lactated ringers bolus 1,000 mL (0 mLs Intravenous Stopped 01/23/20 0141)  iohexol (OMNIPAQUE) 300 MG/ML solution 100 mL (100 mLs Intravenous Contrast Given 01/23/20 0009)  ceFEPIme (MAXIPIME) 2 g in sodium chloride 0.9 % 100 mL IVPB (0 g Intravenous Stopped 01/23/20 0239)    And  metroNIDAZOLE (FLAGYL) IVPB 500 mg (0 mg Intravenous Stopped 01/23/20 0240)    Mobility walks

## 2020-01-23 NOTE — Progress Notes (Signed)
Patient ID: Daniel Aguirre, male   DOB: 1950-11-22, 70 y.o.   MRN: [id number] Request received for possible abdominal abscess drain placement in pt. Latest imaging studies have been reviewed by Dr. Grace Isaac.  Abdominal abscess is currently not amenable to drainage(interloop abscess with no percutaneous window).  Recommend IV antibiotics and possible NG tube placement as it appears the abscess is resulting in at least a partial small bowel obstruction.  Please contact Dr. Grace Isaac at [phone] or pager [phone] with any additional questions.

## 2020-01-24 DIAGNOSIS — R197 Diarrhea, unspecified: Secondary | ICD-10-CM

## 2020-01-24 DIAGNOSIS — K5720 Diverticulitis of large intestine with perforation and abscess without bleeding: Principal | ICD-10-CM

## 2020-01-24 DIAGNOSIS — K651 Peritoneal abscess: Secondary | ICD-10-CM | POA: Diagnosis not present

## 2020-01-24 LAB — CBC WITH DIFFERENTIAL/PLATELET
Abs Immature Granulocytes: 0.03 10*3/uL (ref 0.00–0.07)
Basophils Absolute: 0 10*3/uL (ref 0.0–0.1)
Basophils Relative: 0 %
Eosinophils Absolute: 0.1 10*3/uL (ref 0.0–0.5)
Eosinophils Relative: 2 %
HCT: 39.3 % (ref 39.0–52.0)
Hemoglobin: 13.1 g/dL (ref 13.0–17.0)
Immature Granulocytes: 0 %
Lymphocytes Relative: 14 %
Lymphs Abs: 1 10*3/uL (ref 0.7–4.0)
MCH: 30.6 pg (ref 26.0–34.0)
MCHC: 33.3 g/dL (ref 30.0–36.0)
MCV: 91.8 fL (ref 80.0–100.0)
Monocytes Absolute: 0.6 10*3/uL (ref 0.1–1.0)
Monocytes Relative: 9 %
Neutro Abs: 5.1 10*3/uL (ref 1.7–7.7)
Neutrophils Relative %: 75 %
Platelets: 173 10*3/uL (ref 150–400)
RBC: 4.28 MIL/uL (ref 4.22–5.81)
RDW: 13.4 % (ref 11.5–15.5)
WBC: 6.9 10*3/uL (ref 4.0–10.5)
nRBC: 0 % (ref 0.0–0.2)

## 2020-01-24 LAB — GLUCOSE, CAPILLARY
Glucose-Capillary: 100 mg/dL — ABNORMAL HIGH (ref 70–99)
Glucose-Capillary: 104 mg/dL — ABNORMAL HIGH (ref 70–99)
Glucose-Capillary: 115 mg/dL — ABNORMAL HIGH (ref 70–99)
Glucose-Capillary: 115 mg/dL — ABNORMAL HIGH (ref 70–99)

## 2020-01-24 LAB — C DIFFICILE QUICK SCREEN W PCR REFLEX
C Diff antigen: POSITIVE — AB
C Diff toxin: NEGATIVE

## 2020-01-24 LAB — COMPREHENSIVE METABOLIC PANEL
ALT: 29 U/L (ref 0–44)
AST: 20 U/L (ref 15–41)
Albumin: 3.2 g/dL — ABNORMAL LOW (ref 3.5–5.0)
Alkaline Phosphatase: 52 U/L (ref 38–126)
Anion gap: 11 (ref 5–15)
BUN: 18 mg/dL (ref 8–23)
CO2: 20 mmol/L — ABNORMAL LOW (ref 22–32)
Calcium: 8.1 mg/dL — ABNORMAL LOW (ref 8.9–10.3)
Chloride: 108 mmol/L (ref 98–111)
Creatinine, Ser: 1.31 mg/dL — ABNORMAL HIGH (ref 0.61–1.24)
GFR, Estimated: 60 mL/min — ABNORMAL LOW (ref >60)
Glucose, Bld: 105 mg/dL — ABNORMAL HIGH (ref 70–99)
Potassium: 3.7 mmol/L (ref 3.5–5.1)
Sodium: 139 mmol/L (ref 135–145)
Total Bilirubin: 0.9 mg/dL (ref 0.3–1.2)
Total Protein: 6 g/dL — ABNORMAL LOW (ref 6.5–8.1)

## 2020-01-24 MED ORDER — HYDRALAZINE HCL 20 MG/ML IJ SOLN
10.0000 mg | Freq: Once | INTRAMUSCULAR | Status: AC
  Administered 2020-01-24: 10 mg via INTRAVENOUS
  Filled 2020-01-24: qty 1

## 2020-01-24 MED ORDER — DEXTROSE-NACL 5-0.9 % IV SOLN: INTRAVENOUS | Status: DC

## 2020-01-24 MED ORDER — VANCOMYCIN 50 MG/ML ORAL SOLUTION
125.0000 mg | Freq: Four times a day (QID) | ORAL | Status: DC
  Filled 2020-01-24 (×2): qty 2.5

## 2020-01-24 NOTE — Progress Notes (Signed)
Progress Note     Subjective: Patient reports no abdominal pain when lying still. Denies nausea or vomiting. Patient having bowel function. Thinks he may have had this back in November but symptoms were less severe and resolved on their own so he did not seek treatment.   Objective: Vital signs in last 24 hours: Temp:  [97.8 F (36.6 C)-98.3 F (36.8 C)] 98.2 F (36.8 C) (01/04 0511) Pulse Rate:  [78-87] 78 (01/04 0511) Resp:  [14-18] 16 (01/04 0511) BP: (136-152)/(86-96) 136/86 (01/04 0511) SpO2:  [94 %-97 %] 94 % (01/04 0511) Last BM Date: 01/23/20  Intake/Output from previous day: 01/03 0701 - 01/04 0700 In: 1822.6 [I.V.:1422.6; IV Piggyback:400] Out: -  Intake/Output this shift: No intake/output data recorded.  PE: General: pleasant, WD, WN male who is laying in bed in NAD Heart: regular, rate, and rhythm.   Lungs: CTAB, no wheezes, rhonchi, or rales noted.  Respiratory effort nonlabored Abd: soft, ttp in LLQ and epigastrium without guarding or peritonitis, ND, +BS, no masses, hernias, or organomegaly MS: all 4 extremities are symmetrical with no cyanosis, clubbing, or edema. Skin: warm and dry with no masses, lesions, or rashes Neuro: Cranial nerves 2-12 grossly intact, sensation is normal throughout Psych: A&Ox3 with an appropriate affect.    Lab Results:  Recent Labs    01/23/20 0630 01/24/20 0514  WBC 8.0 6.9  HGB 14.4 13.1  HCT 43.1 39.3  PLT 176 173   BMET Recent Labs    01/23/20 0630 01/24/20 0514  NA 137 139  K 4.5 3.7  CL 106 108  CO2 20* 20*  GLUCOSE 111* 105*  BUN 24* 18  CREATININE 1.47* 1.31*  CALCIUM 8.5* 8.1*   PT/INR No results for input(s): LABPROT, INR in the last 72 hours. CMP     Component Value Date/Time   NA 139 01/24/2020 0514   K 3.7 01/24/2020 0514   CL 108 01/24/2020 0514   CO2 20 (L) 01/24/2020 0514   GLUCOSE 105 (H) 01/24/2020 0514   BUN 18 01/24/2020 0514   CREATININE 1.31 (H) 01/24/2020 0514   CALCIUM 8.1  (L) 01/24/2020 0514   PROT 6.0 (L) 01/24/2020 0514   ALBUMIN 3.2 (L) 01/24/2020 0514   AST 20 01/24/2020 0514   ALT 29 01/24/2020 0514   ALKPHOS 52 01/24/2020 0514   BILITOT 0.9 01/24/2020 0514   GFRNONAA 60 (L) 01/24/2020 0514   GFRAA 39 (L) 09/24/2019 2327   Lipase     Component Value Date/Time   LIPASE 34 01/22/2020 2129       Studies/Results: CT ABDOMEN PELVIS W CONTRAST  Result Date: 01/23/2020 CLINICAL DATA:  Diarrhea EXAM: CT ABDOMEN AND PELVIS WITH CONTRAST TECHNIQUE: Multidetector CT imaging of the abdomen and pelvis was performed using the standard protocol following bolus administration of intravenous contrast. CONTRAST:  OMNIPAQUE IOHEXOL 300 MG/ML  SOLN COMPARISON:  None. FINDINGS: Lower chest: The visualized lung bases are clear bilaterally. The visualized heart and pericardium are unremarkable. Hepatobiliary: Cholecystectomy has been performed. Liver unremarkable. No intra or extrahepatic biliary ductal dilation. Pancreas: Unremarkable Spleen: Unremarkable Adrenals/Urinary Tract: The adrenal glands are unremarkable. 3 mm nonobstructing calculus noted within the upper pole of the left kidney. Simple cortical cyst noted within the left kidney. The kidneys are otherwise unremarkable. Bladder is unremarkable. Stomach/Bowel: There is severe descending and sigmoid diverticulosis. There is superimposed circumferential bowel wall thickening involving the mid sigmoid colon with pericolonic inflammatory stranding in keeping with changes of perforated sigmoid diverticulitis. There  is an adjacent pericolonic abscess within the sigmoid colonic mesentery measuring 2.2 x 3.0 x 4.2 cm on axial image # 58 and coronal image # 73. The abscess is in close proximity to multiple loops of small bowel which demonstrate hyperemia, likely related to the adjacent inflammatory process. This is most evident on axial image # 56 and coronal image # 75. There is a resultant high-grade partial small bowel  obstruction with multiple fluid-filled dilated loops of small bowel proximally. There is mild ascites. No free intraperitoneal gas. The appendix is normal. Vascular/Lymphatic: No significant vascular findings are present. No enlarged abdominal or pelvic lymph nodes. Reproductive: Prostate is unremarkable. Other: No abdominal wall hernia. Musculoskeletal: No acute bone abnormality. No lytic or blastic bone lesion. IMPRESSION: Perforated sigmoid diverticulitis with 4.2 cm abscess within the small bowel mesentery. Extensive small-bowel inflammation adjacent to the a diverticular abscess with resultant high-grade partial small bowel obstruction involving the distal small bowel. Extensive hyperemia of the distal small bowel, likely reactive to the adjacent inflammatory process within the mesentery. Background severe descending and sigmoid colonic diverticulosis. Electronically Signed   By: Helyn Numbers MD   On: 01/23/2020 00:39    Anti-infectives: Anti-infectives (From admission, onward)   Start     Dose/Rate Route Frequency Ordered Stop   01/23/20 1000  metroNIDAZOLE (FLAGYL) IVPB 500 mg        500 mg 100 mL/hr over 60 Minutes Intravenous Every 8 hours 01/23/20 0510     01/23/20 0600  cefTRIAXone (ROCEPHIN) 2 g in sodium chloride 0.9 % 100 mL IVPB        2 g 200 mL/hr over 30 Minutes Intravenous Every 24 hours 01/23/20 0510     01/23/20 0130  ceFEPIme (MAXIPIME) 2 g in sodium chloride 0.9 % 100 mL IVPB       "And" Linked Group Details   2 g 200 mL/hr over 30 Minutes Intravenous  Once 01/23/20 0124 01/23/20 0239   01/23/20 0130  metroNIDAZOLE (FLAGYL) IVPB 500 mg       "And" Linked Group Details   500 mg 100 mL/hr over 60 Minutes Intravenous  Once 01/23/20 0124 01/23/20 0240       Assessment/Plan HTN HLD CKD stage III Aortic insufficiency  Atrial fibrillation on eliquis  Sigmoid diverticulitis with abscess in small bowel mesentery - abscess not amenable to drainage - no leukocytosis,  afebrile, HD stable - having bowel function - advance to CLD today - repeat CT likely towards the end of the week - hopefully patient will improve with IV abx and will not require surgical intervention this admission, but we will continue to follow   FEN: CLD VTE: ok for heparin gtt or lovenox from a surgical standpoint ID: rocephin/[email or web address] 1/3>>  LOS: 1 day    Juliet Rude , [facility] 01/24/2020, 9:46 AM Please see Amion for pager number during day hours 7:00am-4:30pm

## 2020-01-24 NOTE — Consult Note (Signed)
[facility]    Date of Admission:  01/22/2020     Reason for Consult: diarrhea, abd abscess    Referring Provider: Mahala Menghini       Abx: 1/2-c ceftriaxone/flagyl  1/2 cefepime        Assessment: 70 y.o. male ckd3, HFpEF, diverticulosis admitted 01/22/2020 for 4 days acute onset abd pain and watery nonbloody diarrhea, along with subjective f/c, found to have complicated diverticulitis. Id consulted for this along with question of cdiff in setting of positive antigen, negative toxin  Reviewed imaging/labs. His presentation consistent with complicated diverticulitis. The diarrhea though is atypical  He is improving on intraabdominal abscess treatment  His diarrhea remains the same. The wbc count is improving. For the cdiff labs, the toxin is negative; only antigen test is positive so far. The pcr is pending.   Testing for cdiff is rather dependant on clinical evaluation as well. Antigen/pcr by itself doesn't confirm active cdiff but could represent colonization. The toxin test is less sensitive than pcr but more specific for active cdiff infection.   If he continues to improve, might not even need treatment if pcr is positive  With regard to abscess, not amenable to drainage. Duration of tx is moving target   Plan: 1. contineu ceftriaxone and metronidazole for abd abscess  2. Will reevaluate clinically and f/u cdiff pcr; I believe a gi panel pcr was also sent by primary team 3. Final duration and abx regimen for abd abscess pendign clinical changes within the next few days 4. Can defer empiric cdiff treatment for now  I discussed case with primary team  Principal Problem:   Diverticulitis of large intestine with perforation and abscess without bleeding Active Problems:   A-fib (HCC)   HTN (hypertension)   CKD (chronic kidney disease) stage 3, GFR 30-59 ml/min (HCC)   Diverticulitis   Intra-abdominal abscess (HCC)   Scheduled Meds: .  metoprolol tartrate  2.5 mg Intravenous Q6H  . vancomycin  125 mg Oral QID   Continuous Infusions: . cefTRIAXone (ROCEPHIN)  IV 2 g (01/24/20 0643)  . dextrose 5 % and 0.9% NaCl 75 mL/hr at 01/24/20 1417  . metronidazole 500 mg (01/24/20 0941)   PRN Meds:.acetaminophen **OR** acetaminophen, morphine injection  HPI: Daniel Aguirre is a 70 y.o. male ckd3, HFpEF, diverticulosis admitted 01/22/2020 for 4 days acute onset abd pain and watery nonbloody diarrhea, along with subjective f/c, found to have complicated diverticulitis  He was on vacation with his wife. 4 days prior to admission had acute upper abd pain, this quickly progressed to become lower abdominal pain. The next day he developed watery diarrhea about 10 times a day. Nonbloody. He also endorsed associated subjective chill/fever but no rigor. His appetite is decreased. No sob/chest pain, rash, joint pain, headache  His wife is fine.   He doesn't have any abx prior month  No frequent hospital admission  Has had colonoscopies in the past. Initially with polyps resected and last colonsocopy per his report "clean." known diverticular disease. No hx diverticulitis  On admission there is no documented fever; his wbc was only 8. His cr was 1.5 (around baseline), normal sodium and lft. No blood culture abd ct showed ruptured diverticulitis Ir/surgery consulted Abscess not amenable to drainage  Today he reports abd pain better, appetite improving, overall feelign better. However his diarrhea frequency remains the same  He denies family hx crohn's or inflammatory bowel disease   Review  of Systems: ROS  Past Medical History:  Diagnosis Date  . CKD (chronic kidney disease), stage III (HCC)   . Diastolic dysfunction    a. 09/2015 Echo: EF 65-70%, no rwma, Gr1 DD, Mod AI. Asc AO 21mm. Mildly dil Asc Ao.  LA 49mm.  Marland Kitchen Dyslipidemia   . Hypertension   . Kidney stones   . Moderate aortic insufficiency    a. 09/2015 Echo: Mod AI.  Marland Kitchen  Right bundle branch block   . Testicular hypofunction     Social History   Tobacco Use  . Smoking status: Never Smoker  . Smokeless tobacco: Never Used  Vaping Use  . Vaping Use: Never used  Substance Use Topics  . Alcohol use: Yes  . Drug use: Never    Family History  Problem Relation Age of Onset  . Colon cancer Mother 83  . AAA (abdominal aortic aneurysm) Father 17  . Stroke Father   . Heart attack Brother 72       presumed MI - SCD  . Stroke Brother 25   Allergies  Allergen Reactions  . Amlodipine Palpitations  . Lisinopril Other (See Comments)    Elevated  creatinine   . Penicillins Other (See Comments)    Childhood reaction     OBJECTIVE: Blood pressure 136/86, pulse 78, temperature 98.2 F (36.8 C), temperature source Oral, resp. rate 16, height 5\' 8"  (1.727 m), weight 81 kg, SpO2 94 %.  Physical Exam Constitutional:      General: He is not in acute distress.    Appearance: He is not ill-appearing.  HENT:     Head: Normocephalic.     Mouth/Throat:     Mouth: Mucous membranes are moist.     Pharynx: Oropharynx is clear.  Eyes:     Extraocular Movements: Extraocular movements intact.     Pupils: Pupils are equal, round, and reactive to light.  Cardiovascular:     Rate and Rhythm: Normal rate and regular rhythm.     Heart sounds: Normal heart sounds.  Pulmonary:     Effort: Pulmonary effort is normal.     Breath sounds: Normal breath sounds.  Abdominal:     Palpations: Abdomen is soft.     Comments: Mild tenderness lower abdomen on deep palpation  No rebound or guarding  Skin:    General: Skin is warm.     Findings: No rash.  Neurological:     General: No focal deficit present.     Mental Status: He is alert and oriented to person, place, and time.     Cranial Nerves: No cranial nerve deficit.     Motor: No weakness.  Psychiatric:        Mood and Affect: Mood normal.        Behavior: Behavior normal.     Lab Results Lab Results   Component Value Date   WBC 6.9 01/24/2020   HGB 13.1 01/24/2020   HCT 39.3 01/24/2020   MCV 91.8 01/24/2020   PLT 173 01/24/2020    Lab Results  Component Value Date   CREATININE 1.31 (H) 01/24/2020   BUN 18 01/24/2020   NA 139 01/24/2020   K 3.7 01/24/2020   CL 108 01/24/2020   CO2 20 (L) 01/24/2020    Lab Results  Component Value Date   ALT 29 01/24/2020   AST 20 01/24/2020   ALKPHOS 52 01/24/2020   BILITOT 0.9 01/24/2020     Microbiology: Recent Results (from the past 240 hour(s))  Resp Panel by RT-PCR (Flu A&B, Covid) Nasopharyngeal Swab     Status: None   Collection Time: 01/23/20  1:31 AM   Specimen: Nasopharyngeal Swab; Nasopharyngeal(NP) swabs in vial transport medium  Result Value Ref Range Status   SARS Coronavirus 2 by RT PCR NEGATIVE NEGATIVE Final    Comment: (NOTE) SARS-CoV-2 target nucleic acids are NOT DETECTED.  The SARS-CoV-2 RNA is generally detectable in upper respiratory specimens during the acute phase of infection. The lowest concentration of SARS-CoV-2 viral copies this assay can detect is 138 copies/mL. A negative result does not preclude SARS-Cov-2 infection and should not be used as the sole basis for treatment or other patient management decisions. A negative result may occur with  improper specimen collection/handling, submission of specimen other than nasopharyngeal swab, presence of viral mutation(s) within the areas targeted by this assay, and inadequate number of viral copies(<138 copies/mL). A negative result must be combined with clinical observations, patient history, and epidemiological information. The expected result is Negative.  Fact Sheet for Patients:  EntrepreneurPulse.com.au  Fact Sheet for Healthcare Providers:  IncredibleEmployment.be  This test is no t yet approved or cleared by the [place] FDA and  has been authorized for detection and/or diagnosis of SARS-CoV-2 by FDA  under an Emergency Use Authorization (EUA). This EUA will remain  in effect (meaning this test can be used) for the duration of the COVID-19 declaration under Section 564(b)(1) of the Act, 21 U.S.C.section 360bbb-3(b)(1), unless the authorization is terminated  or revoked sooner.       Influenza A by PCR NEGATIVE NEGATIVE Final   Influenza B by PCR NEGATIVE NEGATIVE Final    Comment: (NOTE) The Xpert Xpress SARS-CoV-2/FLU/RSV plus assay is intended as an aid in the diagnosis of influenza from Nasopharyngeal swab specimens and should not be used as a sole basis for treatment. Nasal washings and aspirates are unacceptable for Xpert Xpress SARS-CoV-2/FLU/RSV testing.  Fact Sheet for Patients: EntrepreneurPulse.com.au  Fact Sheet for Healthcare Providers: IncredibleEmployment.be  This test is not yet approved or cleared by the [place] FDA and has been authorized for detection and/or diagnosis of SARS-CoV-2 by FDA under an Emergency Use Authorization (EUA). This EUA will remain in effect (meaning this test can be used) for the duration of the COVID-19 declaration under Section 564(b)(1) of the Act, 21 U.S.C. section 360bbb-3(b)(1), unless the authorization is terminated or revoked.  Performed at [facility], [place] [address], [place], [place] [address]   C Difficile Quick Screen w PCR reflex     Status: Abnormal   Collection Time: 01/24/20 11:40 AM   Specimen: STOOL  Result Value Ref Range Status   C Diff antigen POSITIVE (A) NEGATIVE Final   C Diff toxin NEGATIVE NEGATIVE Final   C Diff interpretation Results are indeterminate. See PCR results.  Final    Comment: Performed at [facility], [place] [address], [place], [place] [address]   Pending GI pcr  Imaging: 01/23/20 abd ct I personally reviewed imaging and agree there is an abscess that is surrounded by bowels Perforated sigmoid diverticulitis  with 4.2 cm abscess within the small bowel mesentery.  Extensive small-bowel inflammation adjacent to the a diverticular abscess with resultant high-grade partial small bowel obstruction involving the distal small bowel. Extensive hyperemia of the distal small bowel, likely reactive to the adjacent inflammatory process within the mesentery.  Background severe descending and sigmoid colonic diverticulosis.   Jabier Mutton, [place] [facility] [phone] pager    01/24/2020, 2:29 PM

## 2020-01-24 NOTE — Progress Notes (Signed)
PROGRESS NOTE   Daniel Aguirre  ENI:[id number] DOB: [birth year] DOA: 01/22/2020 PCP: Daisy Floro, MD  Brief Narrative:   28 white male HTN HLD CKD 3 paroxysmal A. fib [admitted 12/2017 for the same] Recent abdominal pain around Thanksgiving with fever chills nausea Re-Developed lower quadrant pain over several days  Came to emergency room and CT scan showed sigmoid diverticulitis and central bowel mesentery with perforation General surgery Dr. Cliffton Asters interventional radiology consulted--- it is not felt that this area is amenable to IR drain Tells me last colonoscopy was 5 years ago with no polyps-initial colonoscopy (done at age 70 as mother has history of colorectal CA) showed 11 polyps   Assessment & Plan:   Principal Problem:   Diverticulitis of large intestine with perforation and abscess without bleeding Active Problems:   A-fib (HCC)   HTN (hypertension)   CKD (chronic kidney disease) stage 3, GFR 30-59 ml/min (HCC)   Diverticulitis   Intra-abdominal abscess (HCC)   1. Diverticulitis with perforation abscess a. Defer to general surgery for further imaging and planning-looks like his diet has been graduated to liquids  b. continue D5 NS 75 cc/H c. Continue cefepime Flagyl d. rule out C. difficile, + pathogen panel ordered also 2. Paroxysmal A. Fib CHADS2 score >4 a. Start IV heparin as Eliquis on hold b. Continue as needed metoprolol IV 2.5 every 6 and keep on monitors for another 24 hours 3. HTN a. As above-eventually resume Cardizem 240 metoprolol 25 twice daily once cleared for diet 4. CKD 3 a. Currently stable at this time  DVT prophylaxis: Heparin Code Status: Full Family Communication: None present Disposition:  Status is: Inpatient  Remains inpatient appropriate because:Ongoing diagnostic testing needed not appropriate for outpatient work up, Unsafe d/c plan, IV treatments appropriate due to intensity of illness or inability to take PO and Inpatient  level of care appropriate due to severity of illness   Dispo: The patient is from: Home              Anticipated d/c is to: Home              Anticipated d/c date is: > 3 days              Patient currently is not medically stable to d/c.  Consultants:   General surgery  Procedures:  None as yet  Antimicrobials: Cefepime and Flagyl   Subjective: Feeling fair Sitting up passing still copious watery stools No prior antibiotic use but still having symptoms Which have not slowed He has not vomited and kept down the fluids today that were ordered by general surgery  Objective: Vitals:   01/23/20 1847 01/23/20 2107 01/23/20 2300 01/24/20 0511  BP: (!) 148/87 (!) 148/86 (!) 152/91 136/86  Pulse: 85 82 87 78  Resp: 16 16 14 16   Temp: 97.8 F (36.6 C) 97.9 F (36.6 C) 98.2 F (36.8 C) 98.2 F (36.8 C)  TempSrc: Oral Oral Oral Oral  SpO2: 95% 96% 97% 94%  Weight:      Height:        Intake/Output Summary (Last 24 hours) at 01/24/2020 0940 Last data filed at 01/24/2020 0418 Gross per 24 hour  Intake 1822.6 ml  Output --  Net 1822.6 ml   Filed Weights   01/23/20 0500  Weight: 81 kg    Examination:  Awake alert coherent no distress EOMI NCAT Pleasant S1-S2 no murmur no rub no gallop-sinus rhythm on exam Abdomen tender in lower quadrants  slight distention no rebound guarding Chest is clear without rales rhonchi ROM intact power 5/5 no focal deficit  Data Reviewed: I have personally reviewed following labs and imaging studies  BUNs/creatinine 23/1.5-->18/1.3 CO2 20 White count 6.9 hemoglobin 13.1  Radiology Studies: CT ABDOMEN PELVIS W CONTRAST  Result Date: 01/23/2020 CLINICAL DATA:  Diarrhea EXAM: CT ABDOMEN AND PELVIS WITH CONTRAST TECHNIQUE: Multidetector CT imaging of the abdomen and pelvis was performed using the standard protocol following bolus administration of intravenous contrast. CONTRAST:  OMNIPAQUE IOHEXOL 300 MG/ML  SOLN COMPARISON:  None.  FINDINGS: Lower chest: The visualized lung bases are clear bilaterally. The visualized heart and pericardium are unremarkable. Hepatobiliary: Cholecystectomy has been performed. Liver unremarkable. No intra or extrahepatic biliary ductal dilation. Pancreas: Unremarkable Spleen: Unremarkable Adrenals/Urinary Tract: The adrenal glands are unremarkable. 3 mm nonobstructing calculus noted within the upper pole of the left kidney. Simple cortical cyst noted within the left kidney. The kidneys are otherwise unremarkable. Bladder is unremarkable. Stomach/Bowel: There is severe descending and sigmoid diverticulosis. There is superimposed circumferential bowel wall thickening involving the mid sigmoid colon with pericolonic inflammatory stranding in keeping with changes of perforated sigmoid diverticulitis. There is an adjacent pericolonic abscess within the sigmoid colonic mesentery measuring 2.2 x 3.0 x 4.2 cm on axial image # 58 and coronal image # 73. The abscess is in close proximity to multiple loops of small bowel which demonstrate hyperemia, likely related to the adjacent inflammatory process. This is most evident on axial image # 56 and coronal image # 75. There is a resultant high-grade partial small bowel obstruction with multiple fluid-filled dilated loops of small bowel proximally. There is mild ascites. No free intraperitoneal gas. The appendix is normal. Vascular/Lymphatic: No significant vascular findings are present. No enlarged abdominal or pelvic lymph nodes. Reproductive: Prostate is unremarkable. Other: No abdominal wall hernia. Musculoskeletal: No acute bone abnormality. No lytic or blastic bone lesion. IMPRESSION: Perforated sigmoid diverticulitis with 4.2 cm abscess within the small bowel mesentery. Extensive small-bowel inflammation adjacent to the a diverticular abscess with resultant high-grade partial small bowel obstruction involving the distal small bowel. Extensive hyperemia of the distal small  bowel, likely reactive to the adjacent inflammatory process within the mesentery. Background severe descending and sigmoid colonic diverticulosis. Electronically Signed   By: Helyn Numbers MD   On: 01/23/2020 00:39     Scheduled Meds: . metoprolol tartrate  2.5 mg Intravenous Q6H   Continuous Infusions: . cefTRIAXone (ROCEPHIN)  IV 2 g (01/24/20 5631)  . metronidazole 500 mg (01/24/20 0248)     LOS: 1 day    Time spent: 49  Rhetta Mura, MD [facility] To contact the attending provider between 7A-7P or the covering provider during after hours 7P-7A, please log into the web site [email or web address] and access using universal [place] password for that web site. If you do not have the password, please call the hospital operator.  01/24/2020, 9:40 AM

## 2020-01-25 ENCOUNTER — Telehealth: Payer: Self-pay | Admitting: Internal Medicine

## 2020-01-25 DIAGNOSIS — K651 Peritoneal abscess: Secondary | ICD-10-CM

## 2020-01-25 DIAGNOSIS — I480 Paroxysmal atrial fibrillation: Secondary | ICD-10-CM | POA: Diagnosis not present

## 2020-01-25 DIAGNOSIS — K5720 Diverticulitis of large intestine with perforation and abscess without bleeding: Secondary | ICD-10-CM | POA: Diagnosis not present

## 2020-01-25 DIAGNOSIS — R197 Diarrhea, unspecified: Secondary | ICD-10-CM | POA: Diagnosis not present

## 2020-01-25 DIAGNOSIS — K5792 Diverticulitis of intestine, part unspecified, without perforation or abscess without bleeding: Secondary | ICD-10-CM

## 2020-01-25 LAB — GASTROINTESTINAL PANEL BY PCR, STOOL (REPLACES STOOL CULTURE)

## 2020-01-25 LAB — COMPREHENSIVE METABOLIC PANEL
ALT: 38 U/L (ref 0–44)
AST: 26 U/L (ref 15–41)
Albumin: 3.6 g/dL (ref 3.5–5.0)
Alkaline Phosphatase: 63 U/L (ref 38–126)
Anion gap: 12 (ref 5–15)
BUN: 11 mg/dL (ref 8–23)
CO2: 21 mmol/L — ABNORMAL LOW (ref 22–32)
Calcium: 8.4 mg/dL — ABNORMAL LOW (ref 8.9–10.3)
Chloride: 106 mmol/L (ref 98–111)
Creatinine, Ser: 1.26 mg/dL — ABNORMAL HIGH (ref 0.61–1.24)
GFR, Estimated: >60 mL/min (ref >60)
Glucose, Bld: 121 mg/dL — ABNORMAL HIGH (ref 70–99)
Potassium: 3.8 mmol/L (ref 3.5–5.1)
Sodium: 139 mmol/L (ref 135–145)
Total Bilirubin: 0.6 mg/dL (ref 0.3–1.2)
Total Protein: 6.9 g/dL (ref 6.5–8.1)

## 2020-01-25 LAB — CBC WITH DIFFERENTIAL/PLATELET
Abs Immature Granulocytes: 0.03 10*3/uL (ref 0.00–0.07)
Basophils Absolute: 0 10*3/uL (ref 0.0–0.1)
Basophils Relative: 0 %
Eosinophils Absolute: 0 10*3/uL (ref 0.0–0.5)
Eosinophils Relative: 1 %
HCT: 41.4 % (ref 39.0–52.0)
Hemoglobin: 14.1 g/dL (ref 13.0–17.0)
Immature Granulocytes: 0 %
Lymphocytes Relative: 12 %
Lymphs Abs: 0.8 10*3/uL (ref 0.7–4.0)
MCH: 30.7 pg (ref 26.0–34.0)
MCHC: 34.1 g/dL (ref 30.0–36.0)
MCV: 90.2 fL (ref 80.0–100.0)
Monocytes Absolute: 0.6 10*3/uL (ref 0.1–1.0)
Monocytes Relative: 8 %
Neutro Abs: 5.5 10*3/uL (ref 1.7–7.7)
Neutrophils Relative %: 79 %
Platelets: 210 10*3/uL (ref 150–400)
RBC: 4.59 MIL/uL (ref 4.22–5.81)
RDW: 13.3 % (ref 11.5–15.5)
WBC: 7 10*3/uL (ref 4.0–10.5)
nRBC: 0 % (ref 0.0–0.2)

## 2020-01-25 LAB — GLUCOSE, CAPILLARY
Glucose-Capillary: 128 mg/dL — ABNORMAL HIGH (ref 70–99)
Glucose-Capillary: 110 mg/dL — ABNORMAL HIGH (ref 70–99)
Glucose-Capillary: 117 mg/dL — ABNORMAL HIGH (ref 70–99)

## 2020-01-25 LAB — MAGNESIUM: Magnesium: 2.1 mg/dL (ref 1.7–2.4)

## 2020-01-25 LAB — CLOSTRIDIUM DIFFICILE BY PCR, REFLEXED: Toxigenic C. Difficile by PCR: NEGATIVE

## 2020-01-25 MED ORDER — ENOXAPARIN SODIUM 80 MG/0.8ML ~~LOC~~ SOLN
1.0000 mg/kg | Freq: Two times a day (BID) | SUBCUTANEOUS | Status: DC
  Administered 2020-01-25 – 2020-01-28 (×8): 80 mg via SUBCUTANEOUS
  Filled 2020-01-25 (×9): qty 0.8

## 2020-01-25 MED ORDER — METHOCARBAMOL 500 MG PO TABS
500.0000 mg | ORAL_TABLET | Freq: Three times a day (TID) | ORAL | Status: DC | PRN
  Administered 2020-01-26 (×3): 500 mg via ORAL
  Filled 2020-01-25 (×3): qty 1

## 2020-01-25 NOTE — Progress Notes (Addendum)
[facility] for Infectious Disease  Date of Admission:  01/22/2020      Abx: 1/2-c ceftriaxone/flagyl  1/2 cefepime                                                     Assessment: 70 y.o. male ckd3, HFpEF, diverticulosis admitted 01/22/2020 for 4 days acute onset abd pain and watery nonbloody diarrhea, along with subjective f/c, found to have complicated diverticulitis. Id consulted for this along with question of cdiff in setting of positive antigen, negative toxin  Reviewed imaging/labs. His presentation consistent with complicated diverticulitis. The diarrhea though is atypical  He is improving on intraabdominal abscess treatment  His diarrhea remains the same. The wbc count is improving. For the cdiff labs, the toxin is negative; only antigen test is positive so far. The pcr is pending.   Testing for cdiff is rather dependant on clinical evaluation as well. Antigen/pcr by itself doesn't confirm active cdiff but could represent colonization. The toxin test is less sensitive than pcr but more specific for active cdiff infection.   If he continues to improve, might not even need treatment if pcr is positive  With regard to abscess, not amenable to drainage. Duration of tx is moving target  ------------ 1/05 assessment The cdiff pcr is negative, making decision rather straightforward; no active cdiff infection, and he appears to have nontoxigenic cdiff colonization.  Diarrhea appears to be improving He remains without ongoing sepsis and clinically feeling better in terms of complicated diverticulitis  If continued clinical improvement by tomorrow, could switch to oral amoxicillin-clavulonate to finish course of treatment  ID f/u and imaging f/u arranged   Plan: 1. continue ceftriaxone and metronidazole for abd abscess  2. If stable/improving by 1/06, can switch all antibiotics to amoxicillin-clavulonate (augmentin) 875 mg po bid 3. Plan 4 weeks of  augmentin on discharge 4. I will arrange outpatient CT abd in 2-3 weeks from now 5. F/u information below 6. Stop cdiff isolation precaution 7. ID will sign off   Appointment date and time: 02/17/2020 @ 245 with Dr Gale Journey  Final Antibiotics recommendation/duration: 4 weeks of amox-clav starting at discharge  Labs Monitor (check all applied): __ weekly cbc, cmp, crp __ weekly vancomycin __ trough or __ predialysis level __ other abd/pelv ct with contrast prior to ID visit   [facility] address[facility] Loc[facility] in: [facility] Address: [address], [place], [place] [address] Phone: [phone]  Principal Problem:   Diverticulitis of large intestine with perforation and abscess without bleeding Active Problems:   A-fib (HCC)   HTN (hypertension)   CKD (chronic kidney disease) stage 3, GFR 30-59 ml/min (HCC)   Diverticulitis   Intra-abdominal abscess (HCC)   Diarrhea   Scheduled Meds: . enoxaparin (LOVENOX) injection  1 mg/kg Subcutaneous Q12H  . metoprolol tartrate  2.5 mg Intravenous Q6H   Continuous Infusions: . cefTRIAXone (ROCEPHIN)  IV 2 g (01/25/20 0526)  . dextrose 5 % and 0.9% NaCl 75 mL/hr at 01/25/20 0523  . metronidazole 500 mg (01/25/20 0950)   PRN Meds:.acetaminophen **OR** acetaminophen, morphine injection   SUBJECTIVE: Not great appetite; diet to be escalated to full liquid. No n/v Last night didn't have good sleep in his hospital bed with lower back pain, but  this morning pain resolved Diarrhea more formed this morning and less frequent  No fever chill No rash  cdiff pcr negative  Review of Systems: ROS Other ros negative  Allergies  Allergen Reactions  . Amlodipine Palpitations  . Lisinopril Other (See Comments)    Elevated  creatinine   . Penicillins Other (See Comments)    Childhood reaction     OBJECTIVE: Vitals:   01/24/20 1738 01/24/20 2120 01/24/20 2250 01/25/20 0537  BP: 137/89 (!)  180/88 (!) 160/80 140/65  Pulse: 67 74 74 84  Resp: 16 18  18   Temp: 98.4 F (36.9 C) 97.8 F (36.6 C)  97.9 F (36.6 C)  TempSrc: Oral   Axillary  SpO2: 95%   97%  Weight:      Height:       Body mass index is 27.15 kg/m.  Physical Exam Well appearing, no distress HEENT: Normocephalic; per; conj clear Neck supple cv rrr no mrg Lungs clear normal respiratory effort abd soft, mild lower abd tenderness, no rebound Ext no edema Skin no rash Neuro nonfocal  Lab Results Lab Results  Component Value Date   WBC 7.0 01/25/2020   HGB 14.1 01/25/2020   HCT 41.4 01/25/2020   MCV 90.2 01/25/2020   PLT 210 01/25/2020    Lab Results  Component Value Date   CREATININE 1.26 (H) 01/25/2020   BUN 11 01/25/2020   NA 139 01/25/2020   K 3.8 01/25/2020   CL 106 01/25/2020   CO2 21 (L) 01/25/2020    Lab Results  Component Value Date   ALT 38 01/25/2020   AST 26 01/25/2020   ALKPHOS 63 01/25/2020   BILITOT 0.6 01/25/2020     Microbiology: Recent Results (from the past 240 hour(s))  Resp Panel by RT-PCR (Flu A&B, Covid) Nasopharyngeal Swab     Status: None   Collection Time: 01/23/20  1:31 AM   Specimen: Nasopharyngeal Swab; Nasopharyngeal(NP) swabs in vial transport medium  Result Value Ref Range Status   SARS Coronavirus 2 by RT PCR NEGATIVE NEGATIVE Final    Comment: (NOTE) SARS-CoV-2 target nucleic acids are NOT DETECTED.  The SARS-CoV-2 RNA is generally detectable in upper respiratory specimens during the acute phase of infection. The lowest concentration of SARS-CoV-2 viral copies this assay can detect is 138 copies/mL. A negative result does not preclude SARS-Cov-2 infection and should not be used as the sole basis for treatment or other patient management decisions. A negative result may occur with  improper specimen collection/handling, submission of specimen other than nasopharyngeal swab, presence of viral mutation(s) within the areas targeted by this assay,  and inadequate number of viral copies(<138 copies/mL). A negative result must be combined with clinical observations, patient history, and epidemiological information. The expected result is Negative.  Fact Sheet for Patients:  03/22/20  Fact Sheet for Healthcare Providers:  BloggerCourse.com  This test is no t yet approved or cleared by the [place] FDA and  has been authorized for detection and/or diagnosis of SARS-CoV-2 by FDA under an Emergency Use Authorization (EUA). This EUA will remain  in effect (meaning this test can be used) for the duration of the COVID-19 declaration under Section 564(b)(1) of the Act, 21 U.S.C.section 360bbb-3(b)(1), unless the authorization is terminated  or revoked sooner.       Influenza A by PCR NEGATIVE NEGATIVE Final   Influenza B by PCR NEGATIVE NEGATIVE Final    Comment: (NOTE) The Xpert Xpress SARS-CoV-2/FLU/RSV plus assay is intended as an  aid in the diagnosis of influenza from Nasopharyngeal swab specimens and should not be used as a sole basis for treatment. Nasal washings and aspirates are unacceptable for Xpert Xpress SARS-CoV-2/FLU/RSV testing.  Fact Sheet for Patients: BloggerCourse.com  Fact Sheet for Healthcare Providers: SeriousBroker.it  This test is not yet approved or cleared by the [place] FDA and has been authorized for detection and/or diagnosis of SARS-CoV-2 by FDA under an Emergency Use Authorization (EUA). This EUA will remain in effect (meaning this test can be used) for the duration of the COVID-19 declaration under Section 564(b)(1) of the Act, 21 U.S.C. section 360bbb-3(b)(1), unless the authorization is terminated or revoked.  Performed at [facility], [address], [place], [place] [address]   C Difficile Quick Screen w PCR reflex     Status: Abnormal   Collection  Time: 01/24/20 11:40 AM   Specimen: STOOL  Result Value Ref Range Status   C Diff antigen POSITIVE (A) NEGATIVE Final   C Diff toxin NEGATIVE NEGATIVE Final   C Diff interpretation Results are indeterminate. See PCR results.  Final    Comment: Performed at [facility], [address] [place], [place] [address]  Gastrointestinal Panel by PCR , Stool     Status: None   Collection Time: 01/24/20 11:40 AM   Specimen: Stool  Result Value Ref Range Status   Campylobacter species NOT DETECTED NOT DETECTED Final   Plesimonas shigelloides NOT DETECTED NOT DETECTED Final   Salmonella species NOT DETECTED NOT DETECTED Final   Yersinia enterocolitica NOT DETECTED NOT DETECTED Final   Vibrio species NOT DETECTED NOT DETECTED Final   Vibrio cholerae NOT DETECTED NOT DETECTED Final   Enteroaggregative E coli (EAEC) NOT DETECTED NOT DETECTED Final   Enteropathogenic E coli (EPEC) NOT DETECTED NOT DETECTED Final   Enterotoxigenic E coli (ETEC) NOT DETECTED NOT DETECTED Final   Shiga like toxin producing E coli (STEC) NOT DETECTED NOT DETECTED Final   Shigella/Enteroinvasive E coli (EIEC) NOT DETECTED NOT DETECTED Final   Cryptosporidium NOT DETECTED NOT DETECTED Final   Cyclospora cayetanensis NOT DETECTED NOT DETECTED Final   Entamoeba histolytica NOT DETECTED NOT DETECTED Final   Giardia lamblia NOT DETECTED NOT DETECTED Final   Adenovirus F40/41 NOT DETECTED NOT DETECTED Final   Astrovirus NOT DETECTED NOT DETECTED Final   Norovirus GI/GII NOT DETECTED NOT DETECTED Final   Rotavirus A NOT DETECTED NOT DETECTED Final   Sapovirus (I, II, IV, and V) NOT DETECTED NOT DETECTED Final    Comment: Performed at [facility], [address] [place], [place] [address]  C. Diff by PCR, Reflexed     Status: None   Collection Time: 01/24/20 11:40 AM  Result Value Ref Range Status   Toxigenic C. Difficile by PCR NEGATIVE NEGATIVE Final    Comment: Patient is colonized with non  toxigenic C. difficile. May not need treatment unless significant symptoms are present. Performed at [facility], [address] [place], [place] [address]     Serology: cdiff screen -- antigen positive, toxin/pcr negative  Imaging: 01/23/20 abd ct I personally reviewed imaging and agree there is an abscess that is surrounded by bowels Perforated sigmoid diverticulitis with 4.2 cm abscess within the small bowel mesentery.  Extensive small-bowel inflammation adjacent to the a diverticular abscess with resultant high-grade partial small bowel obstruction involving the distal small bowel. Extensive hyperemia of the distal small bowel, likely reactive to the adjacent inflammatory process within the  mesentery.  Background severe descending and sigmoid colonic diverticulosis.  Raymondo Band, MD [facility] [phone] pager    01/25/2020, 10:19 AM

## 2020-01-25 NOTE — Telephone Encounter (Signed)
Will have scheduled once he is discharged per scheduling can not schedule until he is discharged

## 2020-01-25 NOTE — Progress Notes (Signed)
PROGRESS NOTE    Daniel Aguirre  YIR:[id number] DOB: 1950-10-03 DOA: 01/22/2020 PCP: Daisy Floro, MD   Brief Narrative: Daniel Aguirre is a 70 y.o. male with history of atrial fibrillation, hypertension, chronic kidney disease stage III. Patient presented secondary to abdominal pain and was found to have diverticulitis with perforation and abscess. Empiric antibiotics initiated.   Assessment & Plan:   Principal Problem:   Diverticulitis of large intestine with perforation and abscess without bleeding Active Problems:   A-fib (HCC)   HTN (hypertension)   CKD (chronic kidney disease) stage 3, GFR 30-59 ml/min (HCC)   Diverticulitis   Intra-abdominal abscess (HCC)   Diarrhea   Diverticulitis with perforation and abscess Started on empiric Cefepime -> Ceftriaxone and Flagyl. Not amenable to drainage secondary to position. General surgery and infectious disease consulted. Currently planning to hold off on surgical management for now. -General surgery: likely repeat CT in AM -Infectious disease: Continue Ceftriaxone/Flagyl; eventual transition to Augmentin with plan for 4 week treatment and follow-up CT abdomen in 2-3 weeks  Non-sustained V-tach  14 beats. Normal potassium -Check magnesium and EKG  Paroxysmal atrial fibrillation -Lovenox (treatment dose) while inpatient  CKD stage IIIa Stable.  DVT prophylaxis: Lovenox for VTE treatment Code Status:   Code Status: Full Code Family Communication: None at bedside Disposition Plan: Discharge home in 1-3 days pending general surgery recommendations and diet advancement   Consultants:   General surgery  Infectious disease  Procedures:   None  Antimicrobials:  Flagyl  Ceftriaxone  Cefepime    Subjective: Feeling better than yesterday. No appetite.  Objective: Vitals:   01/24/20 1738 01/24/20 2120 01/24/20 2250 01/25/20 0537  BP: 137/89 (!) 180/88 (!) 160/80 140/65  Pulse: 67 74 74 84  Resp:  16 18  18   Temp: 98.4 F (36.9 C) 97.8 F (36.6 C)  97.9 F (36.6 C)  TempSrc: Oral   Axillary  SpO2: 95%   97%  Weight:      Height:        Intake/Output Summary (Last 24 hours) at 01/25/2020 1043 Last data filed at 01/25/2020 0526 Gross per 24 hour  Intake 560 ml  Output 850 ml  Net -290 ml   Filed Weights   01/23/20 0500  Weight: 81 kg    Examination:  General exam: Appears calm and comfortable Respiratory system: Clear to auscultation. Respiratory effort normal. Cardiovascular system: S1 & S2 heard, RRR. 2/6 systolic murmur with crescendo/decresendo properties Gastrointestinal system: Abdomen is slightly distended, soft and nontender. No organomegaly or masses felt. Normal bowel sounds heard. Central nervous system: Alert and oriented. No focal neurological deficits. Musculoskeletal: No edema. No calf tenderness Skin: No cyanosis. No rashes Psychiatry: Judgement and insight appear normal. Mood & affect appropriate.     Data Reviewed: I have personally reviewed following labs and imaging studies  CBC Lab Results  Component Value Date   WBC 7.0 01/25/2020   RBC 4.59 01/25/2020   HGB 14.1 01/25/2020   HCT 41.4 01/25/2020   MCV 90.2 01/25/2020   MCH 30.7 01/25/2020   PLT 210 01/25/2020   MCHC 34.1 01/25/2020   RDW 13.3 01/25/2020   LYMPHSABS 0.8 01/25/2020   MONOABS 0.6 01/25/2020   EOSABS 0.0 01/25/2020   BASOSABS 0.0 01/25/2020     Last metabolic panel Lab Results  Component Value Date   NA 139 01/25/2020   K 3.8 01/25/2020   CL 106 01/25/2020   CO2 21 (L) 01/25/2020   BUN 11  01/25/2020   CREATININE 1.26 (H) 01/25/2020   GLUCOSE 121 (H) 01/25/2020   GFRNONAA >60 01/25/2020   GFRAA 39 (L) 09/24/2019   CALCIUM 8.4 (L) 01/25/2020   PROT 6.9 01/25/2020   ALBUMIN 3.6 01/25/2020   BILITOT 0.6 01/25/2020   ALKPHOS 63 01/25/2020   AST 26 01/25/2020   ALT 38 01/25/2020   ANIONGAP 12 01/25/2020    CBG (last 3)  Recent Labs    01/24/20 1740  01/24/20 2319 01/25/20 0550  GLUCAP 115* 115* 128*     GFR: Estimated Creatinine Clearance: 55 mL/min (A) (by C-G formula based on SCr of 1.26 mg/dL (H)).  Coagulation Profile: No results for input(s): INR, PROTIME in the last 168 hours.  Recent Results (from the past 240 hour(s))  Resp Panel by RT-PCR (Flu A&B, Covid) Nasopharyngeal Swab     Status: None   Collection Time: 01/23/20  1:31 AM   Specimen: Nasopharyngeal Swab; Nasopharyngeal(NP) swabs in vial transport medium  Result Value Ref Range Status   SARS Coronavirus 2 by RT PCR NEGATIVE NEGATIVE Final    Comment: (NOTE) SARS-CoV-2 target nucleic acids are NOT DETECTED.  The SARS-CoV-2 RNA is generally detectable in upper respiratory specimens during the acute phase of infection. The lowest concentration of SARS-CoV-2 viral copies this assay can detect is 138 copies/mL. A negative result does not preclude SARS-Cov-2 infection and should not be used as the sole basis for treatment or other patient management decisions. A negative result may occur with  improper specimen collection/handling, submission of specimen other than nasopharyngeal swab, presence of viral mutation(s) within the areas targeted by this assay, and inadequate number of viral copies(<138 copies/mL). A negative result must be combined with clinical observations, patient history, and epidemiological information. The expected result is Negative.  Fact Sheet for Patients:  EntrepreneurPulse.com.au  Fact Sheet for Healthcare Providers:  IncredibleEmployment.be  This test is no t yet approved or cleared by the [place] FDA and  has been authorized for detection and/or diagnosis of SARS-CoV-2 by FDA under an Emergency Use Authorization (EUA). This EUA will remain  in effect (meaning this test can be used) for the duration of the COVID-19 declaration under Section 564(b)(1) of the Act, 21 U.S.C.section 360bbb-3(b)(1),  unless the authorization is terminated  or revoked sooner.       Influenza A by PCR NEGATIVE NEGATIVE Final   Influenza B by PCR NEGATIVE NEGATIVE Final    Comment: (NOTE) The Xpert Xpress SARS-CoV-2/FLU/RSV plus assay is intended as an aid in the diagnosis of influenza from Nasopharyngeal swab specimens and should not be used as a sole basis for treatment. Nasal washings and aspirates are unacceptable for Xpert Xpress SARS-CoV-2/FLU/RSV testing.  Fact Sheet for Patients: EntrepreneurPulse.com.au  Fact Sheet for Healthcare Providers: IncredibleEmployment.be  This test is not yet approved or cleared by the [place] FDA and has been authorized for detection and/or diagnosis of SARS-CoV-2 by FDA under an Emergency Use Authorization (EUA). This EUA will remain in effect (meaning this test can be used) for the duration of the COVID-19 declaration under Section 564(b)(1) of the Act, 21 U.S.C. section 360bbb-3(b)(1), unless the authorization is terminated or revoked.  Performed at [facility], [place], [place], [place] [address]   C Difficile Quick Screen w PCR reflex     Status: Abnormal   Collection Time: 01/24/20 11:40 AM   Specimen: STOOL  Result Value Ref Range Status   C Diff antigen POSITIVE (A) NEGATIVE Final  C Diff toxin NEGATIVE NEGATIVE Final   C Diff interpretation Results are indeterminate. See PCR results.  Final    Comment: Performed at [facility], [address] [place], [place] [address]  Gastrointestinal Panel by PCR , Stool     Status: None   Collection Time: 01/24/20 11:40 AM   Specimen: Stool  Result Value Ref Range Status   Campylobacter species NOT DETECTED NOT DETECTED Final   Plesimonas shigelloides NOT DETECTED NOT DETECTED Final   Salmonella species NOT DETECTED NOT DETECTED Final   Yersinia enterocolitica NOT DETECTED NOT DETECTED Final   Vibrio species NOT  DETECTED NOT DETECTED Final   Vibrio cholerae NOT DETECTED NOT DETECTED Final   Enteroaggregative E coli (EAEC) NOT DETECTED NOT DETECTED Final   Enteropathogenic E coli (EPEC) NOT DETECTED NOT DETECTED Final   Enterotoxigenic E coli (ETEC) NOT DETECTED NOT DETECTED Final   Shiga like toxin producing E coli (STEC) NOT DETECTED NOT DETECTED Final   Shigella/Enteroinvasive E coli (EIEC) NOT DETECTED NOT DETECTED Final   Cryptosporidium NOT DETECTED NOT DETECTED Final   Cyclospora cayetanensis NOT DETECTED NOT DETECTED Final   Entamoeba histolytica NOT DETECTED NOT DETECTED Final   Giardia lamblia NOT DETECTED NOT DETECTED Final   Adenovirus F40/41 NOT DETECTED NOT DETECTED Final   Astrovirus NOT DETECTED NOT DETECTED Final   Norovirus GI/GII NOT DETECTED NOT DETECTED Final   Rotavirus A NOT DETECTED NOT DETECTED Final   Sapovirus (I, II, IV, and V) NOT DETECTED NOT DETECTED Final    Comment: Performed at [facility], [address] [place], [place] [address]  C. Diff by PCR, Reflexed     Status: None   Collection Time: 01/24/20 11:40 AM  Result Value Ref Range Status   Toxigenic C. Difficile by PCR NEGATIVE NEGATIVE Final    Comment: Patient is colonized with non toxigenic C. difficile. May not need treatment unless significant symptoms are present. Performed at [facility], [address], [place], [place] [address]         Radiology Studies: No results found.      Scheduled Meds: . enoxaparin (LOVENOX) injection  1 mg/kg Subcutaneous Q12H  . metoprolol tartrate  2.5 mg Intravenous Q6H   Continuous Infusions: . cefTRIAXone (ROCEPHIN)  IV 2 g (01/25/20 0526)  . dextrose 5 % and 0.9% NaCl 75 mL/hr at 01/25/20 0523  . metronidazole 500 mg (01/25/20 0950)     LOS: 2 days     Jacquelin Hawking, MD [facility] 01/25/2020, 10:43 AM  If 7PM-7AM, please contact night-coverage [email or web address]

## 2020-01-25 NOTE — Progress Notes (Signed)
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 He does not have any details about the allergy and has always just been told that he is allergic. He is going to check with his older sister today to see if she has any specific memory of the reaction. I discussed with him that since this allergy occurred as an infant, he is very low risk for having a reaction as an adult. Given this information, Daniel Aguirre was okay with trying oral Augmentin as an inpatient tomorrow if appropriate and monitoring for any reactions.    Plan If stable tomorrow 1/6, challenge with oral Augmentin 875 mg BID and monitor for any reactions Discussed with Dr. Nancee Liter, PharmD, BCPS, BCIDP Infectious Diseases Clinical Pharmacist Phone: [phone] 01/25/2020 11:49 AM

## 2020-01-25 NOTE — Progress Notes (Signed)
Progress Note     Subjective: Patient reports back pain overnight, feels better with pain meds and lumbar support from chair. Denies abdominal pain and reports he actually feels more normal this AM. Denies nausea. Poor PO intake, just isn't hungry. Having bowel function and had a BM this AM which was less watery.   Objective: Vital signs in last 24 hours: Temp:  [97.8 F (36.6 C)-98.4 F (36.9 C)] 97.9 F (36.6 C) (01/05 0537) Pulse Rate:  [67-84] 84 (01/05 0537) Resp:  [16-18] 18 (01/05 0537) BP: (137-180)/(65-89) 140/65 (01/05 0537) SpO2:  [95 %-97 %] 97 % (01/05 0537) Last BM Date: 01/25/20  Intake/Output from previous day: 01/04 0701 - 01/05 0700 In: 800 [P.O.:800] Out: 950 [Urine:950] Intake/Output this shift: No intake/output data recorded.  PE: General: pleasant, WD, WN male who is laying in bed in NAD Heart: regular, rate, and rhythm.   Lungs: CTAB, no wheezes, rhonchi, or rales noted.  Respiratory effort nonlabored Abd: soft, no ttp, moderately distended, +BS, no masses, hernias, or organomegaly MS: all 4 extremities are symmetrical with no cyanosis, clubbing, or edema. Skin: warm and dry with no masses, lesions, or rashes Neuro: Cranial nerves 2-12 grossly intact, sensation is normal throughout Psych: A&Ox3 with an appropriate affect.   Lab Results:  Recent Labs    01/24/20 0514 01/25/20 0909  WBC 6.9 7.0  HGB 13.1 14.1  HCT 39.3 41.4  PLT 173 210   BMET Recent Labs    01/24/20 0514 01/25/20 0909  NA 139 139  K 3.7 3.8  CL 108 106  CO2 20* 21*  GLUCOSE 105* 121*  BUN 18 11  CREATININE 1.31* 1.26*  CALCIUM 8.1* 8.4*   PT/INR No results for input(s): LABPROT, INR in the last 72 hours. CMP     Component Value Date/Time   NA 139 01/25/2020 0909   K 3.8 01/25/2020 0909   CL 106 01/25/2020 0909   CO2 21 (L) 01/25/2020 0909   GLUCOSE 121 (H) 01/25/2020 0909   BUN 11 01/25/2020 0909   CREATININE 1.26 (H) 01/25/2020 0909   CALCIUM 8.4 (L)  01/25/2020 0909   PROT 6.9 01/25/2020 0909   ALBUMIN 3.6 01/25/2020 0909   AST 26 01/25/2020 0909   ALT 38 01/25/2020 0909   ALKPHOS 63 01/25/2020 0909   BILITOT 0.6 01/25/2020 0909   GFRNONAA >60 01/25/2020 0909   GFRAA 39 (L) 09/24/2019 2327   Lipase     Component Value Date/Time   LIPASE 34 01/22/2020 2129       Studies/Results: No results found.  Anti-infectives: Anti-infectives (From admission, onward)   Start     Dose/Rate Route Frequency Ordered Stop   01/24/20 1500  vancomycin (VANCOCIN) 50 mg/mL oral solution 125 mg  Status:  Discontinued        125 mg Oral 4 times daily 01/24/20 1354 01/24/20 1430   01/23/20 1000  metroNIDAZOLE (FLAGYL) IVPB 500 mg        500 mg 100 mL/hr over 60 Minutes Intravenous Every 8 hours 01/23/20 0510     01/23/20 0600  cefTRIAXone (ROCEPHIN) 2 g in sodium chloride 0.9 % 100 mL IVPB        2 g 200 mL/hr over 30 Minutes Intravenous Every 24 hours 01/23/20 0510     01/23/20 0130  ceFEPIme (MAXIPIME) 2 g in sodium chloride 0.9 % 100 mL IVPB       "And" Linked Group Details   2 g 200 mL/hr over 30 Minutes  Intravenous  Once 01/23/20 0124 01/23/20 0239   01/23/20 0130  metroNIDAZOLE (FLAGYL) IVPB 500 mg       "And" Linked Group Details   500 mg 100 mL/hr over 60 Minutes Intravenous  Once 01/23/20 0124 01/23/20 0240       Assessment/Plan HTN HLD CKD stage III Aortic insufficiency  Atrial fibrillation on eliquis  Sigmoid diverticulitis with abscess in small bowel mesentery - abscess not amenable to drainage - no leukocytosis, afebrile, HD stable - having bowel function - tolerating some CLD but not taking in much, cont CLD - repeat CT tomorrow AM - hopefully patient will improve with IV abx and will not require surgical intervention this admission, but we will continue to follow   FEN: CLD VTE: 80 mg lovenox BID ID: [email or web address] 1/3>>  LOS: 2 days    Juliet Rude , [facility] Surgery 01/25/2020, 10:32  AM Please see Amion for pager number during day hours 7:00am-4:30pm

## 2020-01-25 NOTE — Telephone Encounter (Signed)
abd abscess F/u imaging request

## 2020-01-26 ENCOUNTER — Inpatient Hospital Stay (HOSPITAL_COMMUNITY): Payer: No Typology Code available for payment source

## 2020-01-26 DIAGNOSIS — I480 Paroxysmal atrial fibrillation: Secondary | ICD-10-CM | POA: Diagnosis not present

## 2020-01-26 DIAGNOSIS — K5792 Diverticulitis of intestine, part unspecified, without perforation or abscess without bleeding: Secondary | ICD-10-CM | POA: Diagnosis not present

## 2020-01-26 DIAGNOSIS — K5720 Diverticulitis of large intestine with perforation and abscess without bleeding: Secondary | ICD-10-CM | POA: Diagnosis not present

## 2020-01-26 DIAGNOSIS — K651 Peritoneal abscess: Secondary | ICD-10-CM | POA: Diagnosis not present

## 2020-01-26 LAB — CBC WITH DIFFERENTIAL/PLATELET
Abs Immature Granulocytes: 0.03 10*3/uL (ref 0.00–0.07)
Basophils Absolute: 0 10*3/uL (ref 0.0–0.1)
Basophils Relative: 1 %
Eosinophils Absolute: 0.1 10*3/uL (ref 0.0–0.5)
Eosinophils Relative: 1 %
HCT: 41.4 % (ref 39.0–52.0)
Hemoglobin: 14 g/dL (ref 13.0–17.0)
Immature Granulocytes: 1 %
Lymphocytes Relative: 14 %
Lymphs Abs: 0.9 10*3/uL (ref 0.7–4.0)
MCH: 31.2 pg (ref 26.0–34.0)
MCHC: 33.8 g/dL (ref 30.0–36.0)
MCV: 92.2 fL (ref 80.0–100.0)
Monocytes Absolute: 0.6 10*3/uL (ref 0.1–1.0)
Monocytes Relative: 10 %
Neutro Abs: 4.7 10*3/uL (ref 1.7–7.7)
Neutrophils Relative %: 73 %
Platelets: 219 10*3/uL (ref 150–400)
RBC: 4.49 MIL/uL (ref 4.22–5.81)
RDW: 13.2 % (ref 11.5–15.5)
WBC: 6.4 10*3/uL (ref 4.0–10.5)
nRBC: 0 % (ref 0.0–0.2)

## 2020-01-26 LAB — COMPREHENSIVE METABOLIC PANEL
ALT: 51 U/L — ABNORMAL HIGH (ref 0–44)
AST: 40 U/L (ref 15–41)
Albumin: 3.4 g/dL — ABNORMAL LOW (ref 3.5–5.0)
Alkaline Phosphatase: 64 U/L (ref 38–126)
Anion gap: 9 (ref 5–15)
BUN: 12 mg/dL (ref 8–23)
CO2: 24 mmol/L (ref 22–32)
Calcium: 8.5 mg/dL — ABNORMAL LOW (ref 8.9–10.3)
Chloride: 109 mmol/L (ref 98–111)
Creatinine, Ser: 1.26 mg/dL — ABNORMAL HIGH (ref 0.61–1.24)
GFR, Estimated: >60 mL/min (ref >60)
Glucose, Bld: 107 mg/dL — ABNORMAL HIGH (ref 70–99)
Potassium: 3.8 mmol/L (ref 3.5–5.1)
Sodium: 142 mmol/L (ref 135–145)
Total Bilirubin: 0.6 mg/dL (ref 0.3–1.2)
Total Protein: 6.6 g/dL (ref 6.5–8.1)

## 2020-01-26 LAB — GLUCOSE, CAPILLARY
Glucose-Capillary: 115 mg/dL — ABNORMAL HIGH (ref 70–99)
Glucose-Capillary: 116 mg/dL — ABNORMAL HIGH (ref 70–99)
Glucose-Capillary: 120 mg/dL — ABNORMAL HIGH (ref 70–99)
Glucose-Capillary: 97 mg/dL (ref 70–99)

## 2020-01-26 MED ORDER — IOHEXOL 9 MG/ML PO SOLN
ORAL | Status: AC
  Administered 2020-01-26: 500 mL via ORAL
  Filled 2020-01-26: qty 1000

## 2020-01-26 MED ORDER — DIPHENHYDRAMINE HCL 50 MG/ML IJ SOLN: 25.0000 mg | Freq: Once | INTRAMUSCULAR | Status: DC | PRN

## 2020-01-26 MED ORDER — IOHEXOL 300 MG/ML  SOLN
100.0000 mL | Freq: Once | INTRAMUSCULAR | Status: AC | PRN
  Administered 2020-01-26: 100 mL via INTRAVENOUS

## 2020-01-26 MED ORDER — IOHEXOL 9 MG/ML PO SOLN
500.0000 mL | ORAL | Status: AC
  Administered 2020-01-26: 500 mL via ORAL

## 2020-01-26 MED ORDER — NAPHAZOLINE-GLYCERIN 0.012-0.2 % OP SOLN
1.0000 [drp] | Freq: Four times a day (QID) | OPHTHALMIC | Status: DC | PRN
  Administered 2020-01-27: 2 [drp] via OPHTHALMIC
  Filled 2020-01-26: qty 15

## 2020-01-26 MED ORDER — AMOXICILLIN-POT CLAVULANATE 875-125 MG PO TABS
1.0000 | ORAL_TABLET | Freq: Two times a day (BID) | ORAL | Status: DC
  Administered 2020-01-26 – 2020-01-29 (×7): 1 via ORAL
  Filled 2020-01-26 (×7): qty 1

## 2020-01-26 NOTE — Progress Notes (Signed)
PROGRESS NOTE    Daniel Aguirre  GUR:[id number] DOB: 12/16/1950 DOA: 01/22/2020 PCP: Lawerance Cruel, MD   Brief Narrative: Daniel Aguirre is a 70 y.o. male with history of atrial fibrillation, hypertension, chronic kidney disease stage III. Patient presented secondary to abdominal pain and was found to have diverticulitis with perforation and abscess. Empiric antibiotics initiated.   Assessment & Plan:   Principal Problem:   Diverticulitis of large intestine with perforation and abscess without bleeding Active Problems:   A-fib (HCC)   HTN (hypertension)   CKD (chronic kidney disease) stage 3, GFR 30-59 ml/min (HCC)   Diverticulitis   Intra-abdominal abscess (HCC)   Diarrhea   Diverticulitis with perforation and abscess Started on empiric Cefepime -> Ceftriaxone and Flagyl. Not amenable to drainage secondary to position. General surgery and infectious disease consulted. Currently planning to hold off on surgical management for now. -General surgery: repeat CT scan today -Infectious disease: Augmentin with plan for 4 week treatment and follow-up CT abdomen in 2-3 weeks  Non-sustained V-tach  14 beats. Normal potassium. Normal magnesium.  Paroxysmal atrial fibrillation -Lovenox (treatment dose) while inpatient  CKD stage IIIa Stable.  DVT prophylaxis: Lovenox for VTE treatment Code Status:   Code Status: Full Code Family Communication: None at bedside Disposition Plan: Discharge home in 1-2 days pending general surgery recommendations and diet advancement   Consultants:   General surgery  Infectious disease  Procedures:   None  Antimicrobials:  Flagyl  Ceftriaxone  Cefepime    Subjective: Continues to feel better. Appetite has improved.  Objective: Vitals:   01/25/20 0537 01/25/20 1310 01/25/20 2110 01/26/20 0514  BP: 140/65 (!) 158/84 (!) 173/91 (!) 154/91  Pulse: 84 73 76 76  Resp: 18 17 14 16   Temp: 97.9 F (36.6 C) 98.4 F (36.9  C) 98.5 F (36.9 C) 98.4 F (36.9 C)  TempSrc: Axillary Oral Oral Oral  SpO2: 97% 95% 98% 96%  Weight:      Height:        Intake/Output Summary (Last 24 hours) at 01/26/2020 1334 Last data filed at 01/26/2020 0532 Gross per 24 hour  Intake 789.03 ml  Output -  Net 789.03 ml   Filed Weights   01/23/20 0500  Weight: 81 kg    Examination:  General exam: Appears calm and comfortable Respiratory system: Clear to auscultation. Respiratory effort normal. Cardiovascular system: S1 & S2 heard, RRR. No murmurs, rubs, gallops or clicks. Gastrointestinal system: Abdomen is mildly distended, soft and with LLQ tenderness. No organomegaly or masses felt. Normal bowel sounds heard. Central nervous system: Alert and oriented. No focal neurological deficits. Musculoskeletal: No edema. No calf tenderness Skin: No cyanosis. No rashes Psychiatry: Judgement and insight appear normal. Mood & affect appropriate.    Data Reviewed: I have personally reviewed following labs and imaging studies  CBC Lab Results  Component Value Date   WBC 6.4 01/26/2020   RBC 4.49 01/26/2020   HGB 14.0 01/26/2020   HCT 41.4 01/26/2020   MCV 92.2 01/26/2020   MCH 31.2 01/26/2020   PLT 219 01/26/2020   MCHC 33.8 01/26/2020   RDW 13.2 01/26/2020   LYMPHSABS 0.9 01/26/2020   MONOABS 0.6 01/26/2020   EOSABS 0.1 01/26/2020   BASOSABS 0.0 28/31/5176     Last metabolic panel Lab Results  Component Value Date   NA 142 01/26/2020   K 3.8 01/26/2020   CL 109 01/26/2020   CO2 24 01/26/2020   BUN 12 01/26/2020   CREATININE  1.26 (H) 01/26/2020   GLUCOSE 107 (H) 01/26/2020   GFRNONAA >60 01/26/2020   GFRAA 39 (L) 09/24/2019   CALCIUM 8.5 (L) 01/26/2020   PROT 6.6 01/26/2020   ALBUMIN 3.4 (L) 01/26/2020   BILITOT 0.6 01/26/2020   ALKPHOS 64 01/26/2020   AST 40 01/26/2020   ALT 51 (H) 01/26/2020   ANIONGAP 9 01/26/2020    CBG (last 3)  Recent Labs    01/26/20 0005 01/26/20 0520 01/26/20 1213   GLUCAP 115* 116* 97     GFR: Estimated Creatinine Clearance: 55 mL/min (A) (by C-G formula based on SCr of 1.26 mg/dL (H)).  Coagulation Profile: No results for input(s): INR, PROTIME in the last 168 hours.  Recent Results (from the past 240 hour(s))  Resp Panel by RT-PCR (Flu A&B, Covid) Nasopharyngeal Swab     Status: None   Collection Time: 01/23/20  1:31 AM   Specimen: Nasopharyngeal Swab; Nasopharyngeal(NP) swabs in vial transport medium  Result Value Ref Range Status   SARS Coronavirus 2 by RT PCR NEGATIVE NEGATIVE Final    Comment: (NOTE) SARS-CoV-2 target nucleic acids are NOT DETECTED.  The SARS-CoV-2 RNA is generally detectable in upper respiratory specimens during the acute phase of infection. The lowest concentration of SARS-CoV-2 viral copies this assay can detect is 138 copies/mL. A negative result does not preclude SARS-Cov-2 infection and should not be used as the sole basis for treatment or other patient management decisions. A negative result may occur with  improper specimen collection/handling, submission of specimen other than nasopharyngeal swab, presence of viral mutation(s) within the areas targeted by this assay, and inadequate number of viral copies(<138 copies/mL). A negative result must be combined with clinical observations, patient history, and epidemiological information. The expected result is Negative.  Fact Sheet for Patients:  BloggerCourse.com  Fact Sheet for Healthcare Providers:  SeriousBroker.it  This test is no t yet approved or cleared by the [place] FDA and  has been authorized for detection and/or diagnosis of SARS-CoV-2 by FDA under an Emergency Use Authorization (EUA). This EUA will remain  in effect (meaning this test can be used) for the duration of the COVID-19 declaration under Section 564(b)(1) of the Act, 21 U.S.C.section 360bbb-3(b)(1), unless the authorization is  terminated  or revoked sooner.       Influenza A by PCR NEGATIVE NEGATIVE Final   Influenza B by PCR NEGATIVE NEGATIVE Final    Comment: (NOTE) The Xpert Xpress SARS-CoV-2/FLU/RSV plus assay is intended as an aid in the diagnosis of influenza from Nasopharyngeal swab specimens and should not be used as a sole basis for treatment. Nasal washings and aspirates are unacceptable for Xpert Xpress SARS-CoV-2/FLU/RSV testing.  Fact Sheet for Patients: BloggerCourse.com  Fact Sheet for Healthcare Providers: SeriousBroker.it  This test is not yet approved or cleared by the [place] FDA and has been authorized for detection and/or diagnosis of SARS-CoV-2 by FDA under an Emergency Use Authorization (EUA). This EUA will remain in effect (meaning this test can be used) for the duration of the COVID-19 declaration under Section 564(b)(1) of the Act, 21 U.S.C. section 360bbb-3(b)(1), unless the authorization is terminated or revoked.  Performed at [facility], [address] [place], [place] [address]   C Difficile Quick Screen w PCR reflex     Status: Abnormal   Collection Time: 01/24/20 11:40 AM   Specimen: STOOL  Result Value Ref Range Status   C Diff antigen POSITIVE (A) NEGATIVE Final   C Diff  toxin NEGATIVE NEGATIVE Final   C Diff interpretation Results are indeterminate. See PCR results.  Final    Comment: Performed at [facility], [address], [place], [place] [address]  Gastrointestinal Panel by PCR , Stool     Status: None   Collection Time: 01/24/20 11:40 AM   Specimen: Stool  Result Value Ref Range Status   Campylobacter species NOT DETECTED NOT DETECTED Final   Plesimonas shigelloides NOT DETECTED NOT DETECTED Final   Salmonella species NOT DETECTED NOT DETECTED Final   Yersinia enterocolitica NOT DETECTED NOT DETECTED Final   Vibrio species NOT DETECTED NOT DETECTED Final    Vibrio cholerae NOT DETECTED NOT DETECTED Final   Enteroaggregative E coli (EAEC) NOT DETECTED NOT DETECTED Final   Enteropathogenic E coli (EPEC) NOT DETECTED NOT DETECTED Final   Enterotoxigenic E coli (ETEC) NOT DETECTED NOT DETECTED Final   Shiga like toxin producing E coli (STEC) NOT DETECTED NOT DETECTED Final   Shigella/Enteroinvasive E coli (EIEC) NOT DETECTED NOT DETECTED Final   Cryptosporidium NOT DETECTED NOT DETECTED Final   Cyclospora cayetanensis NOT DETECTED NOT DETECTED Final   Entamoeba histolytica NOT DETECTED NOT DETECTED Final   Giardia lamblia NOT DETECTED NOT DETECTED Final   Adenovirus F40/41 NOT DETECTED NOT DETECTED Final   Astrovirus NOT DETECTED NOT DETECTED Final   Norovirus GI/GII NOT DETECTED NOT DETECTED Final   Rotavirus A NOT DETECTED NOT DETECTED Final   Sapovirus (I, II, IV, and V) NOT DETECTED NOT DETECTED Final    Comment: Performed at [facility], [address] [place], [place] [address]  C. Diff by PCR, Reflexed     Status: None   Collection Time: 01/24/20 11:40 AM  Result Value Ref Range Status   Toxigenic C. Difficile by PCR NEGATIVE NEGATIVE Final    Comment: Patient is colonized with non toxigenic C. difficile. May not need treatment unless significant symptoms are present. Performed at [facility], [address] [place], [place] [address]         Radiology Studies: CT ABDOMEN PELVIS W CONTRAST  Result Date: 01/26/2020 CLINICAL DATA:  Sigmoid diverticulitis with abscess in small-bowel mesentery, reassessment, afebrile, no leukocytosis. History hypertension, stage III chronic kidney disease, atrial fibrillation EXAM: CT ABDOMEN AND PELVIS WITH CONTRAST TECHNIQUE: Multidetector CT imaging of the abdomen and pelvis was performed using the standard protocol following bolus administration of intravenous contrast. Sagittal and coronal MPR images reconstructed from axial data set. CONTRAST:  OMNIPAQUE IOHEXOL 300 MG/ML  SOLN IV. Dilute oral contrast. COMPARISON:  01/23/2020 FINDINGS: Lower chest: Lung bases clear Hepatobiliary: Gallbladder surgically absent. Liver normal appearance Pancreas: Normal appearance Spleen: Normal appearance Adrenals/Urinary Tract: Adrenal glands and RIGHT kidney normal appearance. LEFT renal cyst 2.0 x 2.0 cm image 37. Tiny nonobstructing upper pole LEFT renal calculus. Kidneys otherwise normal appearance without additional mass. No hydronephrosis or hydroureter. Bladder unremarkable. Stomach/Bowel: Normal appendix. Stomach normal appearance. Wall thickening sigmoid colon with adjacent infiltrative changes corresponding to acute diverticulitis changes seen at previous exam. Descending and sigmoid diverticulosis. Dilated small bowel loops with decompressed colon. Small focal air/fluid collection adjacent to a small bowel loop in the central upper pelvis 3.2 x 3.1 cm image 59 consistent with abscess. Associated thickening of the adjacent small bowel loop, representing transition zone from dilated to nondilated small bowel. Ileum decompressed. Vascular/Lymphatic: Vascular structures patent. Aorta normal caliber. No adenopathy. Reproductive: Prostatic calcifications. Seminal vesicles unremarkable. Other: Small amount of free fluid in pelvis. No free  air. Small RIGHT inguinal hernia containing fat. No additional abscess collections. Musculoskeletal: No acute osseous findings. IMPRESSION: Acute sigmoid diverticulitis with a persistent 3.2 x 3.1 cm diameter abscess adjacent to a small bowel loop in the central upper pelvis. Associated small-bowel obstruction due to edema of adjacent small bowel loop. Small amount of free pelvic fluid without free air. Small RIGHT inguinal hernia containing fat. Tiny nonobstructing upper pole LEFT renal calculus. Electronically Signed   By: Ulyses Southward M.D.   On: 01/26/2020 11:44        Scheduled Meds: . amoxicillin-clavulanate  1 tablet Oral Q12H  . enoxaparin  (LOVENOX) injection  1 mg/kg Subcutaneous Q12H  . metoprolol tartrate  2.5 mg Intravenous Q6H   Continuous Infusions: . dextrose 5 % and 0.9% NaCl 75 mL/hr at 01/25/20 2220     LOS: 3 days     Jacquelin Hawking, MD [facility] 01/26/2020, 1:34 PM  If 7PM-7AM, please contact night-coverage [email or web address]

## 2020-01-26 NOTE — Progress Notes (Signed)
Progress Note     Subjective: Patient reports som gas pains this AM. Denies nausea or vomiting. Still having bowel function. Appetite improving. I also spoke with patient's wife over speaker phone.   Objective: Vital signs in last 24 hours: Temp:  [98.4 F (36.9 C)-98.5 F (36.9 C)] 98.4 F (36.9 C) (01/06 0514) Pulse Rate:  [73-76] 76 (01/06 0514) Resp:  [14-17] 16 (01/06 0514) BP: (154-173)/(84-91) 154/91 (01/06 0514) SpO2:  [95 %-98 %] 96 % (01/06 0514) Last BM Date: 01/26/19  Intake/Output from previous day: 01/05 0701 - 01/06 0700 In: 1564 [P.O.:775; I.V.:789] Out: -  Intake/Output this shift: No intake/output data recorded.  PE: General: pleasant, WD,WNmale who is laying in bed in NAD Heart: regular, rate, and rhythm.  Lungs: CTAB, no wheezes, rhonchi, or rales noted. Respiratory effort nonlabored Abd: soft,mild ttp suprapubic abdomen and LLQ, moderately distended, +BS, no masses, hernias, or organomegaly MS: all 4 extremities are symmetrical with no cyanosis, clubbing, or edema. Skin: warm and dry with no masses, lesions, or rashes Neuro: Cranial nerves 2-12 grossly intact, sensation is normal throughout Psych: A&Ox3 with an appropriate affect.   Lab Results:  Recent Labs    01/25/20 0909 01/26/20 0753  WBC 7.0 6.4  HGB 14.1 14.0  HCT 41.4 41.4  PLT 210 219   BMET Recent Labs    01/25/20 0909 01/26/20 0753  NA 139 142  K 3.8 3.8  CL 106 109  CO2 21* 24  GLUCOSE 121* 107*  BUN 11 12  CREATININE 1.26* 1.26*  CALCIUM 8.4* 8.5*   PT/INR No results for input(s): LABPROT, INR in the last 72 hours. CMP     Component Value Date/Time   NA 142 01/26/2020 0753   K 3.8 01/26/2020 0753   CL 109 01/26/2020 0753   CO2 24 01/26/2020 0753   GLUCOSE 107 (H) 01/26/2020 0753   BUN 12 01/26/2020 0753   CREATININE 1.26 (H) 01/26/2020 0753   CALCIUM 8.5 (L) 01/26/2020 0753   PROT 6.6 01/26/2020 0753   ALBUMIN 3.4 (L) 01/26/2020 0753   AST 40  01/26/2020 0753   ALT 51 (H) 01/26/2020 0753   ALKPHOS 64 01/26/2020 0753   BILITOT 0.6 01/26/2020 0753   GFRNONAA >60 01/26/2020 0753   GFRAA 39 (L) 09/24/2019 2327   Lipase     Component Value Date/Time   LIPASE 34 01/22/2020 2129       Studies/Results: No results found.  Anti-infectives: Anti-infectives (From admission, onward)   Start     Dose/Rate Route Frequency Ordered Stop   01/24/20 1500  vancomycin (VANCOCIN) 50 mg/mL oral solution 125 mg  Status:  Discontinued        125 mg Oral 4 times daily 01/24/20 1354 01/24/20 1430   01/23/20 1000  metroNIDAZOLE (FLAGYL) IVPB 500 mg        500 mg 100 mL/hr over 60 Minutes Intravenous Every 8 hours 01/23/20 0510     01/23/20 0600  cefTRIAXone (ROCEPHIN) 2 g in sodium chloride 0.9 % 100 mL IVPB        2 g 200 mL/hr over 30 Minutes Intravenous Every 24 hours 01/23/20 0510     01/23/20 0130  ceFEPIme (MAXIPIME) 2 g in sodium chloride 0.9 % 100 mL IVPB       "And" Linked Group Details   2 g 200 mL/hr over 30 Minutes Intravenous  Once 01/23/20 0124 01/23/20 0239   01/23/20 0130  metroNIDAZOLE (FLAGYL) IVPB 500 mg       "  And" Linked Group Details   500 mg 100 mL/hr over 60 Minutes Intravenous  Once 01/23/20 0124 01/23/20 0240       Assessment/Plan HTN HLD CKD stage III Aortic insufficiency  Atrial fibrillation on eliquis  Sigmoid diverticulitis with abscess in small bowel mesentery - abscess not amenable to drainage - no leukocytosis, afebrile, HD stable - having bowel function - tolerating CLD and reports increased appetite today  - repeat CT today with PO and IV contrast  - no acute surgical intervention planned at this time but we will continue to follow and will review results of CT scan today   FEN: NPO, IVF VTE: 80 mg lovenox BID ID: [email or web address] 1/3>>  LOS: 3 days    Juliet Rude , [facility] Surgery 01/26/2020, 9:44 AM Please see Amion for pager number during day hours 7:00am-4:30pm

## 2020-01-27 DIAGNOSIS — I480 Paroxysmal atrial fibrillation: Secondary | ICD-10-CM | POA: Diagnosis not present

## 2020-01-27 DIAGNOSIS — K5792 Diverticulitis of intestine, part unspecified, without perforation or abscess without bleeding: Secondary | ICD-10-CM | POA: Diagnosis not present

## 2020-01-27 DIAGNOSIS — K5720 Diverticulitis of large intestine with perforation and abscess without bleeding: Secondary | ICD-10-CM | POA: Diagnosis not present

## 2020-01-27 DIAGNOSIS — K651 Peritoneal abscess: Secondary | ICD-10-CM | POA: Diagnosis not present

## 2020-01-27 LAB — GLUCOSE, CAPILLARY
Glucose-Capillary: 106 mg/dL — ABNORMAL HIGH (ref 70–99)
Glucose-Capillary: 107 mg/dL — ABNORMAL HIGH (ref 70–99)
Glucose-Capillary: 122 mg/dL — ABNORMAL HIGH (ref 70–99)
Glucose-Capillary: 123 mg/dL — ABNORMAL HIGH (ref 70–99)
Glucose-Capillary: 93 mg/dL (ref 70–99)

## 2020-01-27 MED ORDER — METOPROLOL TARTRATE 25 MG PO TABS
25.0000 mg | ORAL_TABLET | Freq: Two times a day (BID) | ORAL | Status: DC
  Administered 2020-01-27 – 2020-01-29 (×5): 25 mg via ORAL
  Filled 2020-01-27 (×5): qty 1

## 2020-01-27 NOTE — Progress Notes (Signed)
PROGRESS NOTE    Daniel Aguirre  YDX:[id number] DOB: [birth year] DOA: 01/22/2020 PCP: Daisy Floro, MD   Brief Narrative: Daniel Aguirre is a 70 y.o. male with history of atrial fibrillation, hypertension, chronic kidney disease stage III. Patient presented secondary to abdominal pain and was found to have diverticulitis with perforation and abscess. Empiric antibiotics initiated.   Assessment & Plan:   Principal Problem:   Diverticulitis of large intestine with perforation and abscess without bleeding Active Problems:   A-fib (HCC)   HTN (hypertension)   CKD (chronic kidney disease) stage 3, GFR 30-59 ml/min (HCC)   Diverticulitis   Intra-abdominal abscess (HCC)   Diarrhea   Diverticulitis with perforation and abscess Started on empiric Cefepime -> Ceftriaxone and Flagyl. Not amenable to drainage secondary to position. General surgery and infectious disease consulted. Currently planning to hold off on surgical management for now. -General surgery: Advanced diet to full liquid -Infectious disease: Augmentin with plan for 4 week treatment and follow-up CT abdomen in 2-3 weeks  Non-sustained V-tach  14 beats. Normal potassium. Normal magnesium.  Paroxysmal atrial fibrillation -Lovenox (treatment dose) while inpatient  CKD stage IIIa Stable.  Partial small bowel obstruction Noted on CT scan (1/6). General surgery assessment is this is likely related to his acute infection and should hopefully resolve with continued treatment.  DVT prophylaxis: Lovenox for VTE treatment Code Status:   Code Status: Full Code Family Communication: None at bedside. Wife on telephone in the room. Disposition Plan: Discharge home in 1-2 days pending general surgery recommendations and continued diet advancement   Consultants:   General surgery  Infectious disease  Procedures:   None  Antimicrobials:  Flagyl  Ceftriaxone  Cefepime    Subjective: Appetite  continues to improved. Bowel movements. Abdominal pain improved. Passing gas.  Objective: Vitals:   01/25/20 2110 01/26/20 0514 01/26/20 2125 01/27/20 0514  BP: (!) 173/91 (!) 154/91 (!) 167/92 (!) 148/88  Pulse: 76 76 76 69  Resp: 14 16 14 16   Temp: 98.5 F (36.9 C) 98.4 F (36.9 C) 98.9 F (37.2 C) 98.3 F (36.8 C)  TempSrc: Oral Oral Oral Oral  SpO2: 98% 96% 97% 97%  Weight:      Height:       No intake or output data in the 24 hours ending 01/27/20 1316 Filed Weights   01/23/20 0500  Weight: 81 kg    Examination:  General exam: Appears calm and comfortable Respiratory system: Clear to auscultation. Respiratory effort normal. Cardiovascular system: S1 & S2 heard, RRR. No murmurs, rubs, gallops or clicks. Gastrointestinal system: Abdomen is distended, soft and mildly tender. No organomegaly or masses felt. Decreased bowel sounds heard. Central nervous system: Alert and oriented. No focal neurological deficits. Musculoskeletal: No edema. No calf tenderness Skin: No cyanosis. No rashes Psychiatry: Judgement and insight appear normal. Mood & affect appropriate.    Data Reviewed: I have personally reviewed following labs and imaging studies  CBC Lab Results  Component Value Date   WBC 6.4 01/26/2020   RBC 4.49 01/26/2020   HGB 14.0 01/26/2020   HCT 41.4 01/26/2020   MCV 92.2 01/26/2020   MCH 31.2 01/26/2020   PLT 219 01/26/2020   MCHC 33.8 01/26/2020   RDW 13.2 01/26/2020   LYMPHSABS 0.9 01/26/2020   MONOABS 0.6 01/26/2020   EOSABS 0.1 01/26/2020   BASOSABS 0.0 01/26/2020     Last metabolic panel Lab Results  Component Value Date   NA 142 01/26/2020   K  3.8 01/26/2020   CL 109 01/26/2020   CO2 24 01/26/2020   BUN 12 01/26/2020   CREATININE 1.26 (H) 01/26/2020   GLUCOSE 107 (H) 01/26/2020   GFRNONAA >60 01/26/2020   GFRAA 39 (L) 09/24/2019   CALCIUM 8.5 (L) 01/26/2020   PROT 6.6 01/26/2020   ALBUMIN 3.4 (L) 01/26/2020   BILITOT 0.6 01/26/2020    ALKPHOS 64 01/26/2020   AST 40 01/26/2020   ALT 51 (H) 01/26/2020   ANIONGAP 9 01/26/2020    CBG (last 3)  Recent Labs    01/27/20 0003 01/27/20 0516 01/27/20 1204  GLUCAP 107* 122* 106*     GFR: Estimated Creatinine Clearance: 55 mL/min (A) (by C-G formula based on SCr of 1.26 mg/dL (H)).  Coagulation Profile: No results for input(s): INR, PROTIME in the last 168 hours.  Recent Results (from the past 240 hour(s))  Resp Panel by RT-PCR (Flu A&B, Covid) Nasopharyngeal Swab     Status: None   Collection Time: 01/23/20  1:31 AM   Specimen: Nasopharyngeal Swab; Nasopharyngeal(NP) swabs in vial transport medium  Result Value Ref Range Status   SARS Coronavirus 2 by RT PCR NEGATIVE NEGATIVE Final    Comment: (NOTE) SARS-CoV-2 target nucleic acids are NOT DETECTED.  The SARS-CoV-2 RNA is generally detectable in upper respiratory specimens during the acute phase of infection. The lowest concentration of SARS-CoV-2 viral copies this assay can detect is 138 copies/mL. A negative result does not preclude SARS-Cov-2 infection and should not be used as the sole basis for treatment or other patient management decisions. A negative result may occur with  improper specimen collection/handling, submission of specimen other than nasopharyngeal swab, presence of viral mutation(s) within the areas targeted by this assay, and inadequate number of viral copies(<138 copies/mL). A negative result must be combined with clinical observations, patient history, and epidemiological information. The expected result is Negative.  Fact Sheet for Patients:  EntrepreneurPulse.com.au  Fact Sheet for Healthcare Providers:  IncredibleEmployment.be  This test is no t yet approved or cleared by the [place] FDA and  has been authorized for detection and/or diagnosis of SARS-CoV-2 by FDA under an Emergency Use Authorization (EUA). This EUA will remain  in effect  (meaning this test can be used) for the duration of the COVID-19 declaration under Section 564(b)(1) of the Act, 21 U.S.C.section 360bbb-3(b)(1), unless the authorization is terminated  or revoked sooner.       Influenza A by PCR NEGATIVE NEGATIVE Final   Influenza B by PCR NEGATIVE NEGATIVE Final    Comment: (NOTE) The Xpert Xpress SARS-CoV-2/FLU/RSV plus assay is intended as an aid in the diagnosis of influenza from Nasopharyngeal swab specimens and should not be used as a sole basis for treatment. Nasal washings and aspirates are unacceptable for Xpert Xpress SARS-CoV-2/FLU/RSV testing.  Fact Sheet for Patients: EntrepreneurPulse.com.au  Fact Sheet for Healthcare Providers: IncredibleEmployment.be  This test is not yet approved or cleared by the [place] FDA and has been authorized for detection and/or diagnosis of SARS-CoV-2 by FDA under an Emergency Use Authorization (EUA). This EUA will remain in effect (meaning this test can be used) for the duration of the COVID-19 declaration under Section 564(b)(1) of the Act, 21 U.S.C. section 360bbb-3(b)(1), unless the authorization is terminated or revoked.  Performed at [facility], [place] [address], [place], [place] [address]   C Difficile Quick Screen w PCR reflex     Status: Abnormal   Collection Time: 01/24/20 11:40 AM   Specimen:  STOOL  Result Value Ref Range Status   C Diff antigen POSITIVE (A) NEGATIVE Final   C Diff toxin NEGATIVE NEGATIVE Final   C Diff interpretation Results are indeterminate. See PCR results.  Final    Comment: Performed at [facility], [address] [place], [place] [address]  Gastrointestinal Panel by PCR , Stool     Status: None   Collection Time: 01/24/20 11:40 AM   Specimen: Stool  Result Value Ref Range Status   Campylobacter species NOT DETECTED NOT DETECTED Final   Plesimonas shigelloides NOT DETECTED NOT  DETECTED Final   Salmonella species NOT DETECTED NOT DETECTED Final   Yersinia enterocolitica NOT DETECTED NOT DETECTED Final   Vibrio species NOT DETECTED NOT DETECTED Final   Vibrio cholerae NOT DETECTED NOT DETECTED Final   Enteroaggregative E coli (EAEC) NOT DETECTED NOT DETECTED Final   Enteropathogenic E coli (EPEC) NOT DETECTED NOT DETECTED Final   Enterotoxigenic E coli (ETEC) NOT DETECTED NOT DETECTED Final   Shiga like toxin producing E coli (STEC) NOT DETECTED NOT DETECTED Final   Shigella/Enteroinvasive E coli (EIEC) NOT DETECTED NOT DETECTED Final   Cryptosporidium NOT DETECTED NOT DETECTED Final   Cyclospora cayetanensis NOT DETECTED NOT DETECTED Final   Entamoeba histolytica NOT DETECTED NOT DETECTED Final   Giardia lamblia NOT DETECTED NOT DETECTED Final   Adenovirus F40/41 NOT DETECTED NOT DETECTED Final   Astrovirus NOT DETECTED NOT DETECTED Final   Norovirus GI/GII NOT DETECTED NOT DETECTED Final   Rotavirus A NOT DETECTED NOT DETECTED Final   Sapovirus (I, II, IV, and V) NOT DETECTED NOT DETECTED Final    Comment: Performed at [facility], [address] [place], [place] [address]  C. Diff by PCR, Reflexed     Status: None   Collection Time: 01/24/20 11:40 AM  Result Value Ref Range Status   Toxigenic C. Difficile by PCR NEGATIVE NEGATIVE Final    Comment: Patient is colonized with non toxigenic C. difficile. May not need treatment unless significant symptoms are present. Performed at [facility], [address] [place], [place] [address]         Radiology Studies: CT ABDOMEN PELVIS W CONTRAST  Result Date: 01/26/2020 CLINICAL DATA:  Sigmoid diverticulitis with abscess in small-bowel mesentery, reassessment, afebrile, no leukocytosis. History hypertension, stage III chronic kidney disease, atrial fibrillation EXAM: CT ABDOMEN AND PELVIS WITH CONTRAST TECHNIQUE: Multidetector CT imaging of the abdomen and pelvis was performed using the standard  protocol following bolus administration of intravenous contrast. Sagittal and coronal MPR images reconstructed from axial data set. CONTRAST:  OMNIPAQUE IOHEXOL 300 MG/ML SOLN IV. Dilute oral contrast. COMPARISON:  01/23/2020 FINDINGS: Lower chest: Lung bases clear Hepatobiliary: Gallbladder surgically absent. Liver normal appearance Pancreas: Normal appearance Spleen: Normal appearance Adrenals/Urinary Tract: Adrenal glands and RIGHT kidney normal appearance. LEFT renal cyst 2.0 x 2.0 cm image 37. Tiny nonobstructing upper pole LEFT renal calculus. Kidneys otherwise normal appearance without additional mass. No hydronephrosis or hydroureter. Bladder unremarkable. Stomach/Bowel: Normal appendix. Stomach normal appearance. Wall thickening sigmoid colon with adjacent infiltrative changes corresponding to acute diverticulitis changes seen at previous exam. Descending and sigmoid diverticulosis. Dilated small bowel loops with decompressed colon. Small focal air/fluid collection adjacent to a small bowel loop in the central upper pelvis 3.2 x 3.1 cm image 59 consistent with abscess. Associated thickening of the adjacent small bowel loop, representing transition zone from dilated to nondilated small bowel. Ileum decompressed. Vascular/Lymphatic: Vascular structures patent. Aorta  normal caliber. No adenopathy. Reproductive: Prostatic calcifications. Seminal vesicles unremarkable. Other: Small amount of free fluid in pelvis. No free air. Small RIGHT inguinal hernia containing fat. No additional abscess collections. Musculoskeletal: No acute osseous findings. IMPRESSION: Acute sigmoid diverticulitis with a persistent 3.2 x 3.1 cm diameter abscess adjacent to a small bowel loop in the central upper pelvis. Associated small-bowel obstruction due to edema of adjacent small bowel loop. Small amount of free pelvic fluid without free air. Small RIGHT inguinal hernia containing fat. Tiny nonobstructing upper pole LEFT renal  calculus. Electronically Signed   By: Ulyses Southward M.D.   On: 01/26/2020 11:44        Scheduled Meds: . amoxicillin-clavulanate  1 tablet Oral Q12H  . enoxaparin (LOVENOX) injection  1 mg/kg Subcutaneous Q12H  . metoprolol tartrate  25 mg Oral BID   Continuous Infusions: . dextrose 5 % and 0.9% NaCl 75 mL/hr at 01/27/20 0521     LOS: 4 days     Jacquelin Hawking, MD [facility] 01/27/2020, 1:16 PM  If 7PM-7AM, please contact night-coverage [email or web address]

## 2020-01-27 NOTE — Progress Notes (Signed)
Subjective/Chief Complaint: Feels much better   Objective: Vital signs in last 24 hours: Temp:  [98.3 F (36.8 C)-98.9 F (37.2 C)] 98.3 F (36.8 C) (01/07 0514) Pulse Rate:  [69-76] 69 (01/07 0514) Resp:  [14-16] 16 (01/07 0514) BP: (148-167)/(88-92) 148/88 (01/07 0514) SpO2:  [97 %] 97 % (01/07 0514) Last BM Date: 01/26/19  Intake/Output from previous day: No intake/output data recorded. Intake/Output this shift: No intake/output data recorded.  General appearance: alert and cooperative Resp: clear to auscultation bilaterally Cardio: regular rate and rhythm GI: soft, nontender. less distended  Lab Results:  Recent Labs    01/25/20 0909 01/26/20 0753  WBC 7.0 6.4  HGB 14.1 14.0  HCT 41.4 41.4  PLT 210 219   BMET Recent Labs    01/25/20 0909 01/26/20 0753  NA 139 142  K 3.8 3.8  CL 106 109  CO2 21* 24  GLUCOSE 121* 107*  BUN 11 12  CREATININE 1.26* 1.26*  CALCIUM 8.4* 8.5*   PT/INR No results for input(s): LABPROT, INR in the last 72 hours. ABG No results for input(s): PHART, HCO3 in the last 72 hours.  Invalid input(s): PCO2, PO2  Studies/Results: CT ABDOMEN PELVIS W CONTRAST  Result Date: 01/26/2020 CLINICAL DATA:  Sigmoid diverticulitis with abscess in small-bowel mesentery, reassessment, afebrile, no leukocytosis. History hypertension, stage Aguirre chronic kidney disease, atrial fibrillation EXAM: CT ABDOMEN AND PELVIS WITH CONTRAST TECHNIQUE: Multidetector CT imaging of the abdomen and pelvis was performed using the standard protocol following bolus administration of intravenous contrast. Sagittal and coronal MPR images reconstructed from axial data set. CONTRAST:  OMNIPAQUE IOHEXOL 300 MG/ML SOLN IV. Dilute oral contrast. COMPARISON:  01/23/2020 FINDINGS: Lower chest: Lung bases clear Hepatobiliary: Gallbladder surgically absent. Liver normal appearance Pancreas: Normal appearance Spleen: Normal appearance Adrenals/Urinary Tract: Adrenal  glands and RIGHT kidney normal appearance. LEFT renal cyst 2.0 x 2.0 cm image 37. Tiny nonobstructing upper pole LEFT renal calculus. Kidneys otherwise normal appearance without additional mass. No hydronephrosis or hydroureter. Bladder unremarkable. Stomach/Bowel: Normal appendix. Stomach normal appearance. Wall thickening sigmoid colon with adjacent infiltrative changes corresponding to acute diverticulitis changes seen at previous exam. Descending and sigmoid diverticulosis. Dilated small bowel loops with decompressed colon. Small focal air/fluid collection adjacent to a small bowel loop in the central upper pelvis 3.2 x 3.1 cm image 59 consistent with abscess. Associated thickening of the adjacent small bowel loop, representing transition zone from dilated to nondilated small bowel. Ileum decompressed. Vascular/Lymphatic: Vascular structures patent. Aorta normal caliber. No adenopathy. Reproductive: Prostatic calcifications. Seminal vesicles unremarkable. Other: Small amount of free fluid in pelvis. No free air. Small RIGHT inguinal hernia containing fat. No additional abscess collections. Musculoskeletal: No acute osseous findings. IMPRESSION: Acute sigmoid diverticulitis with a persistent 3.2 x 3.1 cm diameter abscess adjacent to a small bowel loop in the central upper pelvis. Associated small-bowel obstruction due to edema of adjacent small bowel loop. Small amount of free pelvic fluid without free air. Small RIGHT inguinal hernia containing fat. Tiny nonobstructing upper pole LEFT renal calculus. Electronically Signed   By: Ulyses Southward M.D.   On: 01/26/2020 11:44    Anti-infectives: Anti-infectives (From admission, onward)   Start     Dose/Rate Route Frequency Ordered Stop   01/26/20 1400  amoxicillin-clavulanate (AUGMENTIN) 875-125 MG per tablet 1 tablet        1 tablet Oral Every 12 hours 01/26/20 1208     01/24/20 1500  vancomycin (VANCOCIN) 50 mg/mL oral solution 125 mg  Status:  Discontinued         125 mg Oral 4 times daily 01/24/20 1354 01/24/20 1430   01/23/20 1000  metroNIDAZOLE (FLAGYL) IVPB 500 mg  Status:  Discontinued        500 mg 100 mL/hr over 60 Minutes Intravenous Every 8 hours 01/23/20 0510 01/26/20 1208   01/23/20 0600  cefTRIAXone (ROCEPHIN) 2 g in sodium chloride 0.9 % 100 mL IVPB  Status:  Discontinued        2 g 200 mL/hr over 30 Minutes Intravenous Every 24 hours 01/23/20 0510 01/26/20 1208   01/23/20 0130  ceFEPIme (MAXIPIME) 2 g in sodium chloride 0.9 % 100 mL IVPB       "And" Linked Group Details   2 g 200 mL/hr over 30 Minutes Intravenous  Once 01/23/20 0124 01/23/20 0239   01/23/20 0130  metroNIDAZOLE (FLAGYL) IVPB 500 mg       "And" Linked Group Details   500 mg 100 mL/hr over 60 Minutes Intravenous  Once 01/23/20 0124 01/23/20 0240      Assessment/Plan: s/p * No surgery found * Advance diet. Allow full liquids. Advance slowly as initial CT suggestive of partial sbo secondary to the inflammation. This will likely resolve as infection improves On oral abx Ambulate Abscess not amenable to perc drain. Will need repeat CT next week hopefully as outpt  LOS: 4 days    Daniel Aguirre 01/27/2020

## 2020-01-28 DIAGNOSIS — K651 Peritoneal abscess: Secondary | ICD-10-CM | POA: Diagnosis not present

## 2020-01-28 DIAGNOSIS — K5720 Diverticulitis of large intestine with perforation and abscess without bleeding: Secondary | ICD-10-CM | POA: Diagnosis not present

## 2020-01-28 DIAGNOSIS — I480 Paroxysmal atrial fibrillation: Secondary | ICD-10-CM | POA: Diagnosis not present

## 2020-01-28 DIAGNOSIS — K5792 Diverticulitis of intestine, part unspecified, without perforation or abscess without bleeding: Secondary | ICD-10-CM | POA: Diagnosis not present

## 2020-01-28 LAB — GLUCOSE, CAPILLARY
Glucose-Capillary: 104 mg/dL — ABNORMAL HIGH (ref 70–99)
Glucose-Capillary: 102 mg/dL — ABNORMAL HIGH (ref 70–99)
Glucose-Capillary: 113 mg/dL — ABNORMAL HIGH (ref 70–99)

## 2020-01-28 LAB — CBC WITH DIFFERENTIAL/PLATELET
Abs Immature Granulocytes: 0.03 10*3/uL (ref 0.00–0.07)
Basophils Absolute: 0 10*3/uL (ref 0.0–0.1)
Basophils Relative: 1 %
Eosinophils Absolute: 0.1 10*3/uL (ref 0.0–0.5)
Eosinophils Relative: 2 %
HCT: 43.6 % (ref 39.0–52.0)
Hemoglobin: 14.8 g/dL (ref 13.0–17.0)
Immature Granulocytes: 1 %
Lymphocytes Relative: 18 %
Lymphs Abs: 1 10*3/uL (ref 0.7–4.0)
MCH: 31 pg (ref 26.0–34.0)
MCHC: 33.9 g/dL (ref 30.0–36.0)
MCV: 91.2 fL (ref 80.0–100.0)
Monocytes Absolute: 0.6 10*3/uL (ref 0.1–1.0)
Monocytes Relative: 10 %
Neutro Abs: 4 10*3/uL (ref 1.7–7.7)
Neutrophils Relative %: 68 %
Platelets: 267 10*3/uL (ref 150–400)
RBC: 4.78 MIL/uL (ref 4.22–5.81)
RDW: 13.2 % (ref 11.5–15.5)
WBC: 5.7 10*3/uL (ref 4.0–10.5)
nRBC: 0 % (ref 0.0–0.2)

## 2020-01-28 NOTE — Progress Notes (Signed)
Subjective/Chief Complaint: Patient with multiple loose stools last night.  Abdominal pain is better but still sore in his left lower quadrant.   Objective: Vital signs in last 24 hours: Temp:  [98.3 F (36.8 C)-98.8 F (37.1 C)] 98.3 F (36.8 C) (01/08 0515) Pulse Rate:  [59-73] 71 (01/08 0515) Resp:  [16-20] 16 (01/08 0515) BP: (131-166)/(72-99) 138/72 (01/08 0515) SpO2:  [95 %-99 %] 95 % (01/08 0515) Last BM Date: 01/28/20  Intake/Output from previous day: No intake/output data recorded. Intake/Output this shift: No intake/output data recorded.   General: pleasant, WD,WNmale who is laying in bed in NAD Heart: regular, rate, and rhythm.  Lungs: CTAB, no wheezes, rhonchi, or rales noted. Respiratory effort nonlabored Abd: soft,mild ttp suprapubic abdomen and LLQ,moderately distended, +BS, no masses, hernias, or organomegaly MS: all 4 extremities are symmetrical with no cyanosis, clubbing, or edema. Skin: warm and dry with no masses, lesions, or rashes Neuro: Cranial nerves 2-12 grossly intact, sensation is normal throughout Psych: A&Ox3 with an appropriate affect. Lab Results:  Recent Labs    01/25/20 0909 01/26/20 0753  WBC 7.0 6.4  HGB 14.1 14.0  HCT 41.4 41.4  PLT 210 219   BMET Recent Labs    01/25/20 0909 01/26/20 0753  NA 139 142  K 3.8 3.8  CL 106 109  CO2 21* 24  GLUCOSE 121* 107*  BUN 11 12  CREATININE 1.26* 1.26*  CALCIUM 8.4* 8.5*   PT/INR No results for input(s): LABPROT, INR in the last 72 hours. ABG No results for input(s): PHART, HCO3 in the last 72 hours.  Invalid input(s): PCO2, PO2  Studies/Results: CT ABDOMEN PELVIS W CONTRAST  Result Date: 01/26/2020 CLINICAL DATA:  Sigmoid diverticulitis with abscess in small-bowel mesentery, reassessment, afebrile, no leukocytosis. History hypertension, stage III chronic kidney disease, atrial fibrillation EXAM: CT ABDOMEN AND PELVIS WITH CONTRAST TECHNIQUE: Multidetector CT imaging of  the abdomen and pelvis was performed using the standard protocol following bolus administration of intravenous contrast. Sagittal and coronal MPR images reconstructed from axial data set. CONTRAST:  OMNIPAQUE IOHEXOL 300 MG/ML SOLN IV. Dilute oral contrast. COMPARISON:  01/23/2020 FINDINGS: Lower chest: Lung bases clear Hepatobiliary: Gallbladder surgically absent. Liver normal appearance Pancreas: Normal appearance Spleen: Normal appearance Adrenals/Urinary Tract: Adrenal glands and RIGHT kidney normal appearance. LEFT renal cyst 2.0 x 2.0 cm image 37. Tiny nonobstructing upper pole LEFT renal calculus. Kidneys otherwise normal appearance without additional mass. No hydronephrosis or hydroureter. Bladder unremarkable. Stomach/Bowel: Normal appendix. Stomach normal appearance. Wall thickening sigmoid colon with adjacent infiltrative changes corresponding to acute diverticulitis changes seen at previous exam. Descending and sigmoid diverticulosis. Dilated small bowel loops with decompressed colon. Small focal air/fluid collection adjacent to a small bowel loop in the central upper pelvis 3.2 x 3.1 cm image 59 consistent with abscess. Associated thickening of the adjacent small bowel loop, representing transition zone from dilated to nondilated small bowel. Ileum decompressed. Vascular/Lymphatic: Vascular structures patent. Aorta normal caliber. No adenopathy. Reproductive: Prostatic calcifications. Seminal vesicles unremarkable. Other: Small amount of free fluid in pelvis. No free air. Small RIGHT inguinal hernia containing fat. No additional abscess collections. Musculoskeletal: No acute osseous findings. IMPRESSION: Acute sigmoid diverticulitis with a persistent 3.2 x 3.1 cm diameter abscess adjacent to a small bowel loop in the central upper pelvis. Associated small-bowel obstruction due to edema of adjacent small bowel loop. Small amount of free pelvic fluid without free air. Small RIGHT inguinal hernia  containing fat. Tiny nonobstructing upper pole LEFT renal calculus. Electronically  Signed   By: Ulyses Southward M.D.   On: 01/26/2020 11:44    Anti-infectives: Anti-infectives (From admission, onward)   Start     Dose/Rate Route Frequency Ordered Stop   01/26/20 1400  amoxicillin-clavulanate (AUGMENTIN) 875-125 MG per tablet 1 tablet        1 tablet Oral Every 12 hours 01/26/20 1208     01/24/20 1500  vancomycin (VANCOCIN) 50 mg/mL oral solution 125 mg  Status:  Discontinued        125 mg Oral 4 times daily 01/24/20 1354 01/24/20 1430   01/23/20 1000  metroNIDAZOLE (FLAGYL) IVPB 500 mg  Status:  Discontinued        500 mg 100 mL/hr over 60 Minutes Intravenous Every 8 hours 01/23/20 0510 01/26/20 1208   01/23/20 0600  cefTRIAXone (ROCEPHIN) 2 g in sodium chloride 0.9 % 100 mL IVPB  Status:  Discontinued        2 g 200 mL/hr over 30 Minutes Intravenous Every 24 hours 01/23/20 0510 01/26/20 1208   01/23/20 0130  ceFEPIme (MAXIPIME) 2 g in sodium chloride 0.9 % 100 mL IVPB       "And" Linked Group Details   2 g 200 mL/hr over 30 Minutes Intravenous  Once 01/23/20 0124 01/23/20 0239   01/23/20 0130  metroNIDAZOLE (FLAGYL) IVPB 500 mg       "And" Linked Group Details   500 mg 100 mL/hr over 60 Minutes Intravenous  Once 01/23/20 0124 01/23/20 0240      Assessment/Plan: HTN  HLD CKD stage III Aortic insufficiency  Atrial fibrillation on eliquis  Sigmoid diverticulitis with abscess in small bowel mesentery - abscess not amenable to drainage - no leukocytosis, afebrile, HD stable - having bowel function-diarrhea-if this continues, may be worthwhile to check for C. difficile -tolerating FLD -try soft diet to see how he does.  I told him to take it slow to avoid any nausea vomiting. - - no acute surgical intervention planned at this time but we will continue to follow   FEN:  SOFT DIET  VTE:80 mg lovenox BID ID: [email or web address] 1/3>>     LOS: 5 days    Maisie Fus A  Arianie Couse 01/28/2020

## 2020-01-28 NOTE — Progress Notes (Signed)
PROGRESS NOTE    [name]  QVZ:[id number] DOB: [birth year] DOA: 01/22/2020 PCP: Daisy Floro, MD   Brief Narrative: Daniel Aguirre is a 70 y.o. male with history of atrial fibrillation, hypertension, chronic kidney disease stage III. Patient presented secondary to abdominal pain and was found to have diverticulitis with perforation and abscess. Empiric antibiotics initiated.   Assessment & Plan:   Principal Problem:   Diverticulitis of large intestine with perforation and abscess without bleeding Active Problems:   A-fib (HCC)   HTN (hypertension)   CKD (chronic kidney disease) stage 3, GFR 30-59 ml/min (HCC)   Diverticulitis   Intra-abdominal abscess (HCC)   Diarrhea   Diverticulitis with perforation and abscess Started on empiric Cefepime -> Ceftriaxone and Flagyl. Not amenable to drainage secondary to position. General surgery and infectious disease consulted. Currently planning to hold off on surgical management for now. -General surgery recommendations: Advance soft diet -Infectious disease: Augmentin with plan for 4 week treatment and follow-up CT abdomen in 2-3 weeks  Non-sustained V-tach  14 beats. Normal potassium. Normal magnesium. No recurrent v-tach.  Paroxysmal atrial fibrillation -Lovenox (treatment dose) while inpatient; transition back to home Eliquis once patient is sure to discharge  CKD stage IIIa Stable.  Partial small bowel obstruction Noted on CT scan (1/6). General surgery assessment is this is likely related to his acute infection and should hopefully resolve with continued treatment. Having bowel movements. No abdominal pain. No nausea/vomiting.  Diarrhea Slightly worsened again. C. Difficile antigen positive with negative PCR and toxin. GI pathogen panel also negative.   DVT prophylaxis: Lovenox for VTE treatment Code Status:   Code Status: Full Code Family Communication: None at bedside. Disposition Plan: Discharge home in  1-2 days pending general surgery recommendations and continued diet advancement   Consultants:   General surgery  Infectious disease  Procedures:   None  Antimicrobials:  Flagyl  Ceftriaxone  Cefepime    Subjective: Several bowel movements last night but feels better this morning.  Objective: Vitals:   01/27/20 1355 01/27/20 2017 01/27/20 2156 01/28/20 0515  BP: 131/79 (!) 166/92 (!) 164/99 138/72  Pulse: 73 70 (!) 59 71  Resp: 20 18  16   Temp: 98.8 F (37.1 C) 98.8 F (37.1 C)  98.3 F (36.8 C)  TempSrc: Oral Oral  Oral  SpO2: 98% 99%  95%  Weight:      Height:       No intake or output data in the 24 hours ending 01/28/20 0947 Filed Weights   01/23/20 0500  Weight: 81 kg    Examination:  General exam: Appears calm and comfortable Respiratory system: Clear to auscultation. Respiratory effort normal. Cardiovascular system: S1 & S2 heard, RRR. No murmurs, rubs, gallops or clicks. Gastrointestinal system: Abdomen is distended, soft and nontender. No organomegaly or masses felt. Normal bowel sounds heard. Central nervous system: Alert and oriented. No focal neurological deficits. Musculoskeletal: No edema. No calf tenderness Skin: No cyanosis. No rashes Psychiatry: Judgement and insight appear normal. Mood & affect appropriate.   Data Reviewed: I have personally reviewed following labs and imaging studies  CBC Lab Results  Component Value Date   WBC 5.7 01/28/2020   RBC 4.78 01/28/2020   HGB 14.8 01/28/2020   HCT 43.6 01/28/2020   MCV 91.2 01/28/2020   MCH 31.0 01/28/2020   PLT 267 01/28/2020   MCHC 33.9 01/28/2020   RDW 13.2 01/28/2020   LYMPHSABS 1.0 01/28/2020   MONOABS 0.6 01/28/2020   EOSABS  0.1 01/28/2020   BASOSABS 0.0 01/28/2020     Last metabolic panel Lab Results  Component Value Date   NA 142 01/26/2020   K 3.8 01/26/2020   CL 109 01/26/2020   CO2 24 01/26/2020   BUN 12 01/26/2020   CREATININE 1.26 (H) 01/26/2020   GLUCOSE  107 (H) 01/26/2020   GFRNONAA >60 01/26/2020   GFRAA 39 (L) 09/24/2019   CALCIUM 8.5 (L) 01/26/2020   PROT 6.6 01/26/2020   ALBUMIN 3.4 (L) 01/26/2020   BILITOT 0.6 01/26/2020   ALKPHOS 64 01/26/2020   AST 40 01/26/2020   ALT 51 (H) 01/26/2020   ANIONGAP 9 01/26/2020    CBG (last 3)  Recent Labs    01/27/20 1747 01/27/20 2350 01/28/20 0514  GLUCAP 123* 93 104*     GFR: Estimated Creatinine Clearance: 55 mL/min (A) (by C-G formula based on SCr of 1.26 mg/dL (H)).  Coagulation Profile: No results for input(s): INR, PROTIME in the last 168 hours.  Recent Results (from the past 240 hour(s))  Resp Panel by RT-PCR (Flu A&B, Covid) Nasopharyngeal Swab     Status: None   Collection Time: 01/23/20  1:31 AM   Specimen: Nasopharyngeal Swab; Nasopharyngeal(NP) swabs in vial transport medium  Result Value Ref Range Status   SARS Coronavirus 2 by RT PCR NEGATIVE NEGATIVE Final    Comment: (NOTE) SARS-CoV-2 target nucleic acids are NOT DETECTED.  The SARS-CoV-2 RNA is generally detectable in upper respiratory specimens during the acute phase of infection. The lowest concentration of SARS-CoV-2 viral copies this assay can detect is 138 copies/mL. A negative result does not preclude SARS-Cov-2 infection and should not be used as the sole basis for treatment or other patient management decisions. A negative result may occur with  improper specimen collection/handling, submission of specimen other than nasopharyngeal swab, presence of viral mutation(s) within the areas targeted by this assay, and inadequate number of viral copies(<138 copies/mL). A negative result must be combined with clinical observations, patient history, and epidemiological information. The expected result is Negative.  Fact Sheet for Patients:  BloggerCourse.com  Fact Sheet for Healthcare Providers:  SeriousBroker.it  This test is no t yet approved or cleared  by the [place] FDA and  has been authorized for detection and/or diagnosis of SARS-CoV-2 by FDA under an Emergency Use Authorization (EUA). This EUA will remain  in effect (meaning this test can be used) for the duration of the COVID-19 declaration under Section 564(b)(1) of the Act, 21 U.S.C.section 360bbb-3(b)(1), unless the authorization is terminated  or revoked sooner.       Influenza A by PCR NEGATIVE NEGATIVE Final   Influenza B by PCR NEGATIVE NEGATIVE Final    Comment: (NOTE) The Xpert Xpress SARS-CoV-2/FLU/RSV plus assay is intended as an aid in the diagnosis of influenza from Nasopharyngeal swab specimens and should not be used as a sole basis for treatment. Nasal washings and aspirates are unacceptable for Xpert Xpress SARS-CoV-2/FLU/RSV testing.  Fact Sheet for Patients: BloggerCourse.com  Fact Sheet for Healthcare Providers: SeriousBroker.it  This test is not yet approved or cleared by the [place] FDA and has been authorized for detection and/or diagnosis of SARS-CoV-2 by FDA under an Emergency Use Authorization (EUA). This EUA will remain in effect (meaning this test can be used) for the duration of the COVID-19 declaration under Section 564(b)(1) of the Act, 21 U.S.C. section 360bbb-3(b)(1), unless the authorization is terminated or revoked.  Performed at [facility], [address]  [place], [place] [address]   C Difficile Quick Screen w PCR reflex     Status: Abnormal   Collection Time: 01/24/20 11:40 AM   Specimen: STOOL  Result Value Ref Range Status   C Diff antigen POSITIVE (A) NEGATIVE Final   C Diff toxin NEGATIVE NEGATIVE Final   C Diff interpretation Results are indeterminate. See PCR results.  Final    Comment: Performed at [facility], [address] [place], [place] [address]  Gastrointestinal Panel by PCR , Stool     Status: None   Collection  Time: 01/24/20 11:40 AM   Specimen: Stool  Result Value Ref Range Status   Campylobacter species NOT DETECTED NOT DETECTED Final   Plesimonas shigelloides NOT DETECTED NOT DETECTED Final   Salmonella species NOT DETECTED NOT DETECTED Final   Yersinia enterocolitica NOT DETECTED NOT DETECTED Final   Vibrio species NOT DETECTED NOT DETECTED Final   Vibrio cholerae NOT DETECTED NOT DETECTED Final   Enteroaggregative E coli (EAEC) NOT DETECTED NOT DETECTED Final   Enteropathogenic E coli (EPEC) NOT DETECTED NOT DETECTED Final   Enterotoxigenic E coli (ETEC) NOT DETECTED NOT DETECTED Final   Shiga like toxin producing E coli (STEC) NOT DETECTED NOT DETECTED Final   Shigella/Enteroinvasive E coli (EIEC) NOT DETECTED NOT DETECTED Final   Cryptosporidium NOT DETECTED NOT DETECTED Final   Cyclospora cayetanensis NOT DETECTED NOT DETECTED Final   Entamoeba histolytica NOT DETECTED NOT DETECTED Final   Giardia lamblia NOT DETECTED NOT DETECTED Final   Adenovirus F40/41 NOT DETECTED NOT DETECTED Final   Astrovirus NOT DETECTED NOT DETECTED Final   Norovirus GI/GII NOT DETECTED NOT DETECTED Final   Rotavirus A NOT DETECTED NOT DETECTED Final   Sapovirus (I, II, IV, and V) NOT DETECTED NOT DETECTED Final    Comment: Performed at [facility], [address] [place], [place] [address]  C. Diff by PCR, Reflexed     Status: None   Collection Time: 01/24/20 11:40 AM  Result Value Ref Range Status   Toxigenic C. Difficile by PCR NEGATIVE NEGATIVE Final    Comment: Patient is colonized with non toxigenic C. difficile. May not need treatment unless significant symptoms are present. Performed at [facility], [address] [place], [place] [address]         Radiology Studies: CT ABDOMEN PELVIS W CONTRAST  Result Date: 01/26/2020 CLINICAL DATA:  Sigmoid diverticulitis with abscess in small-bowel mesentery, reassessment, afebrile, no leukocytosis. History hypertension, stage III  chronic kidney disease, atrial fibrillation EXAM: CT ABDOMEN AND PELVIS WITH CONTRAST TECHNIQUE: Multidetector CT imaging of the abdomen and pelvis was performed using the standard protocol following bolus administration of intravenous contrast. Sagittal and coronal MPR images reconstructed from axial data set. CONTRAST:  100mL OMNIPAQUE IOHEXOL 300 MG/ML SOLN IV. Dilute oral contrast. COMPARISON:  01/23/2020 FINDINGS: Lower chest: Lung bases clear Hepatobiliary: Gallbladder surgically absent. Liver normal appearance Pancreas: Normal appearance Spleen: Normal appearance Adrenals/Urinary Tract: Adrenal glands and RIGHT kidney normal appearance. LEFT renal cyst 2.0 x 2.0 cm image 37. Tiny nonobstructing upper pole LEFT renal calculus. Kidneys otherwise normal appearance without additional mass. No hydronephrosis or hydroureter. Bladder unremarkable. Stomach/Bowel: Normal appendix. Stomach normal appearance. Wall thickening sigmoid colon with adjacent infiltrative changes corresponding to acute diverticulitis changes seen at previous exam. Descending and sigmoid diverticulosis. Dilated small bowel loops with decompressed colon. Small focal air/fluid collection adjacent to a small bowel loop in the central upper pelvis 3.2 x 3.1 cm image  59 consistent with abscess. Associated thickening of the adjacent small bowel loop, representing transition zone from dilated to nondilated small bowel. Ileum decompressed. Vascular/Lymphatic: Vascular structures patent. Aorta normal caliber. No adenopathy. Reproductive: Prostatic calcifications. Seminal vesicles unremarkable. Other: Small amount of free fluid in pelvis. No free air. Small RIGHT inguinal hernia containing fat. No additional abscess collections. Musculoskeletal: No acute osseous findings. IMPRESSION: Acute sigmoid diverticulitis with a persistent 3.2 x 3.1 cm diameter abscess adjacent to a small bowel loop in the central upper pelvis. Associated small-bowel obstruction  due to edema of adjacent small bowel loop. Small amount of free pelvic fluid without free air. Small RIGHT inguinal hernia containing fat. Tiny nonobstructing upper pole LEFT renal calculus. Electronically Signed   By: Ulyses Southward M.D.   On: 01/26/2020 11:44        Scheduled Meds: . amoxicillin-clavulanate  1 tablet Oral Q12H  . enoxaparin (LOVENOX) injection  1 mg/kg Subcutaneous Q12H  . metoprolol tartrate  25 mg Oral BID   Continuous Infusions: . dextrose 5 % and 0.9% NaCl 75 mL/hr at 01/28/20 0703     LOS: 5 days     Jacquelin Hawking, MD [facility] 01/28/2020, 9:47 AM  If 7PM-7AM, please contact night-coverage [email or web address]

## 2020-01-29 DIAGNOSIS — K5720 Diverticulitis of large intestine with perforation and abscess without bleeding: Secondary | ICD-10-CM | POA: Diagnosis not present

## 2020-01-29 LAB — GLUCOSE, CAPILLARY
Glucose-Capillary: 90 mg/dL (ref 70–99)
Glucose-Capillary: 95 mg/dL (ref 70–99)
Glucose-Capillary: 96 mg/dL (ref 70–99)

## 2020-01-29 MED ORDER — HYDROCODONE-ACETAMINOPHEN 5-325 MG PO TABS: 1.0000 | ORAL_TABLET | Freq: Four times a day (QID) | ORAL | 0 refills | Status: AC | PRN

## 2020-01-29 MED ORDER — AMOXICILLIN-POT CLAVULANATE 875-125 MG PO TABS: 1.0000 | ORAL_TABLET | Freq: Two times a day (BID) | ORAL | 0 refills | Status: AC

## 2020-01-29 NOTE — Discharge Summary (Signed)
Physician Discharge Summary  Daniel Aguirre:[id number] DOB: 08/01/1950 DOA: 01/22/2020  PCP: Daisy Floro, MD  Admit date: 01/22/2020 Discharge date: 01/29/2020  Admitted From: Home Disposition: Home  Recommendations for Outpatient Follow-up:  1. Follow up with PCP in 1 week 2. Follow up with General surgery and Infectious disease 3. 4 weeks of Augmentin; repeat CT abdomen/pelvis needed for follow-up of abscess 4. Please obtain BMP/CBC in one week 5. Please follow up on the following pending results: None  Home Health: None Equipment/Devices: None  Discharge Condition: Stable CODE STATUS: Full code Diet recommendation: Soft diet  Brief/Interim Summary:  Admission HPI written by Eduard Clos, MD  Chief Complaint: Abdominal pain.  HPI: Daniel Aguirre is a 70 y.o. male with history of atrial fibrillation, hypertension, chronic kidney disease stage III presents to the ER because of abdominal pain ongoing for last 4 days.  Pain is mostly left lower quadrant has had subjective feeling of fever chills.  Denies vomiting or diarrhea.  Due to worsening pain patient presents to the ER.   Hospital course:  Diverticulitis with perforation and abscess Started on empiric Cefepime -> Ceftriaxone and Flagyl. Not amenable to IR drainage secondary to location. General surgery and infectious disease consulted. Repeat CT scan stable. Patient transitioned to Augmentin. Infectious disease recommending 4 week of antibiotics with outpatient imaging. Patient to follow-up with general surgery and infectious disease. Norco prescribed at discharge for pain management.  Non-sustained V-tach  14 beats. Normal potassium. Normal magnesium. No recurrent v-tach.  Paroxysmal atrial fibrillation Lovenox (treatment dose) while inpatient; transition to Eliquis on discharge.  CKD stage IIIa Stable.  Partial small bowel obstruction Noted on CT scan (1/6). General surgery  assessment is this is likely related to his acute infection and should hopefully resolve with continued treatment. Having bowel movements. No abdominal pain. No nausea/vomiting.  Diarrhea Slightly worsened again. C. Difficile antigen positive with negative PCR and toxin. GI pathogen panel also negative.    Discharge Diagnoses:  Principal Problem:   Diverticulitis of large intestine with perforation and abscess without bleeding Active Problems:   A-fib (HCC)   HTN (hypertension)   CKD (chronic kidney disease) stage 3, GFR 30-59 ml/min (HCC)   Diverticulitis   Intra-abdominal abscess (HCC)   Diarrhea    Discharge Instructions  Discharge Instructions    Call MD for:  persistant nausea and vomiting   Complete by: As directed    Call MD for:  severe uncontrolled pain   Complete by: As directed    Call MD for:  temperature >100.4   Complete by: As directed    Increase activity slowly   Complete by: As directed      Allergies as of 01/29/2020      Reactions   Amlodipine Palpitations   Lisinopril Other (See Comments)   Elevated  creatinine       Medication List    TAKE these medications   amoxicillin-clavulanate 875-125 MG tablet Commonly known as: AUGMENTIN Take 1 tablet by mouth 2 (two) times daily for 21 days.   diltiazem 240 MG 24 hr capsule Commonly known as: CARDIZEM CD Take 1 capsule (240 mg total) by mouth daily.   Eliquis 5 MG Tabs tablet Generic drug: apixaban TAKE 1 TABLET BY MOUTH TWICE A DAY   HYDROcodone-acetaminophen 5-325 MG tablet Commonly known as: NORCO/VICODIN Take 1 tablet by mouth every 6 (six) hours as needed for up to 5 days for moderate pain.   metoprolol tartrate 25  MG tablet Commonly known as: LOPRESSOR Take 25 mg by mouth 2 (two) times daily.   tamsulosin 0.4 MG Caps capsule Commonly known as: FLOMAX Take 0.4 mg by mouth daily.   testosterone 50 MG/5GM (1%) Gel Commonly known as: ANDROGEL APPLY 1 TUBE ONCE A DAY AS DIRECTED    Tylenol 8 Hour Arthritis Pain 650 MG CR tablet Generic drug: acetaminophen Take 1,300 mg by mouth in the morning, at noon, and at bedtime.   Vitamin D3 125 MCG (5000 UT) Caps Take 1 capsule by mouth daily.       Follow-up Information    Daisy Florooss, Charles Alan, MD. Schedule an appointment as soon as possible for a visit in 1 week(s).   Specialty: Family Medicine Why: Hospital follow-up Contact information: [address] [place] [address] [phone]        Harriette Bouillonornett, Thomas, MD. Schedule an appointment as soon as possible for a visit in 1 week(s).   Specialty: General Surgery Why: Diverticulitis with perforation and abscess Contact information: [address] [place] [address] [phone]        Raymondo BandVu, Trung T, MD. Go on 02/17/2020.   Specialty: Infectious Diseases Why: 2:45 PM Contact information: [address] [place] [address] [phone]              Allergies  Allergen Reactions  . Amlodipine Palpitations  . Lisinopril Other (See Comments)    Elevated  creatinine     Consultations:  General surgery  Infectious disease   Procedures/Studies: CT ABDOMEN PELVIS W CONTRAST  Result Date: 01/26/2020 CLINICAL DATA:  Sigmoid diverticulitis with abscess in small-bowel mesentery, reassessment, afebrile, no leukocytosis. History hypertension, stage III chronic kidney disease, atrial fibrillation EXAM: CT ABDOMEN AND PELVIS WITH CONTRAST TECHNIQUE: Multidetector CT imaging of the abdomen and pelvis was performed using the standard protocol following bolus administration of intravenous contrast. Sagittal and coronal MPR images reconstructed from axial data set. CONTRAST:  100mL OMNIPAQUE IOHEXOL 300 MG/ML SOLN IV. Dilute oral contrast. COMPARISON:  01/23/2020 FINDINGS: Lower chest: Lung bases clear Hepatobiliary: Gallbladder surgically absent. Liver normal appearance Pancreas: Normal appearance Spleen: Normal appearance  Adrenals/Urinary Tract: Adrenal glands and RIGHT kidney normal appearance. LEFT renal cyst 2.0 x 2.0 cm image 37. Tiny nonobstructing upper pole LEFT renal calculus. Kidneys otherwise normal appearance without additional mass. No hydronephrosis or hydroureter. Bladder unremarkable. Stomach/Bowel: Normal appendix. Stomach normal appearance. Wall thickening sigmoid colon with adjacent infiltrative changes corresponding to acute diverticulitis changes seen at previous exam. Descending and sigmoid diverticulosis. Dilated small bowel loops with decompressed colon. Small focal air/fluid collection adjacent to a small bowel loop in the central upper pelvis 3.2 x 3.1 cm image 59 consistent with abscess. Associated thickening of the adjacent small bowel loop, representing transition zone from dilated to nondilated small bowel. Ileum decompressed. Vascular/Lymphatic: Vascular structures patent. Aorta normal caliber. No adenopathy. Reproductive: Prostatic calcifications. Seminal vesicles unremarkable. Other: Small amount of free fluid in pelvis. No free air. Small RIGHT inguinal hernia containing fat. No additional abscess collections. Musculoskeletal: No acute osseous findings. IMPRESSION: Acute sigmoid diverticulitis with a persistent 3.2 x 3.1 cm diameter abscess adjacent to a small bowel loop in the central upper pelvis. Associated small-bowel obstruction due to edema of adjacent small bowel loop. Small amount of free pelvic fluid without free air. Small RIGHT inguinal hernia containing fat. Tiny nonobstructing upper pole LEFT renal calculus. Electronically Signed   By: Ulyses SouthwardMark  Boles M.D.   On: 01/26/2020 11:44  CT ABDOMEN PELVIS W CONTRAST  Result Date: 01/23/2020 CLINICAL DATA:  Diarrhea EXAM: CT ABDOMEN AND PELVIS WITH CONTRAST TECHNIQUE: Multidetector CT imaging of the abdomen and pelvis was performed using the standard protocol following bolus administration of intravenous contrast. CONTRAST:  OMNIPAQUE  IOHEXOL 300 MG/ML  SOLN COMPARISON:  None. FINDINGS: Lower chest: The visualized lung bases are clear bilaterally. The visualized heart and pericardium are unremarkable. Hepatobiliary: Cholecystectomy has been performed. Liver unremarkable. No intra or extrahepatic biliary ductal dilation. Pancreas: Unremarkable Spleen: Unremarkable Adrenals/Urinary Tract: The adrenal glands are unremarkable. 3 mm nonobstructing calculus noted within the upper pole of the left kidney. Simple cortical cyst noted within the left kidney. The kidneys are otherwise unremarkable. Bladder is unremarkable. Stomach/Bowel: There is severe descending and sigmoid diverticulosis. There is superimposed circumferential bowel wall thickening involving the mid sigmoid colon with pericolonic inflammatory stranding in keeping with changes of perforated sigmoid diverticulitis. There is an adjacent pericolonic abscess within the sigmoid colonic mesentery measuring 2.2 x 3.0 x 4.2 cm on axial image # 58 and coronal image # 73. The abscess is in close proximity to multiple loops of small bowel which demonstrate hyperemia, likely related to the adjacent inflammatory process. This is most evident on axial image # 56 and coronal image # 75. There is a resultant high-grade partial small bowel obstruction with multiple fluid-filled dilated loops of small bowel proximally. There is mild ascites. No free intraperitoneal gas. The appendix is normal. Vascular/Lymphatic: No significant vascular findings are present. No enlarged abdominal or pelvic lymph nodes. Reproductive: Prostate is unremarkable. Other: No abdominal wall hernia. Musculoskeletal: No acute bone abnormality. No lytic or blastic bone lesion. IMPRESSION: Perforated sigmoid diverticulitis with 4.2 cm abscess within the small bowel mesentery. Extensive small-bowel inflammation adjacent to the a diverticular abscess with resultant high-grade partial small bowel obstruction involving the distal small  bowel. Extensive hyperemia of the distal small bowel, likely reactive to the adjacent inflammatory process within the mesentery. Background severe descending and sigmoid colonic diverticulosis. Electronically Signed   By: Helyn Numbers MD   On: 01/23/2020 00:39      Subjective: Some mild abdominal pain. Having improved bowel movements.  Discharge Exam: Vitals:   01/28/20 2050 01/29/20 0505  BP: (!) 159/98 (!) 150/81  Pulse: 76 60  Resp: 18 18  Temp: 98.6 F (37 C) 98.2 F (36.8 C)  SpO2: 98% 97%   Vitals:   01/28/20 0515 01/28/20 1333 01/28/20 2050 01/29/20 0505  BP: 138/72 (!) 155/91 (!) 159/98 (!) 150/81  Pulse: 71 64 76 60  Resp: 16 18 18 18   Temp: 98.3 F (36.8 C) 98.1 F (36.7 C) 98.6 F (37 C) 98.2 F (36.8 C)  TempSrc: Oral Oral Oral Oral  SpO2: 95% 98% 98% 97%  Weight:      Height:        General: Pt is alert, awake, not in acute distress Cardiovascular: RRR, S1/S2 +, no rubs, no gallops Respiratory: CTA bilaterally, no wheezing, no rhonchi Abdominal: Soft, mildly tender, ND, bowel sounds + Extremities: no edema, no cyanosis    The results of significant diagnostics from this hospitalization (including imaging, microbiology, ancillary and laboratory) are listed below for reference.     Microbiology: Recent Results (from the past 240 hour(s))  Resp Panel by RT-PCR (Flu A&B, Covid) Nasopharyngeal Swab     Status: None   Collection Time: 01/23/20  1:31 AM   Specimen: Nasopharyngeal Swab; Nasopharyngeal(NP) swabs in vial transport medium  Result Value Ref Range  Status   SARS Coronavirus 2 by RT PCR NEGATIVE NEGATIVE Final    Comment: (NOTE) SARS-CoV-2 target nucleic acids are NOT DETECTED.  The SARS-CoV-2 RNA is generally detectable in upper respiratory specimens during the acute phase of infection. The lowest concentration of SARS-CoV-2 viral copies this assay can detect is 138 copies/mL. A negative result does not preclude SARS-Cov-2 infection and  should not be used as the sole basis for treatment or other patient management decisions. A negative result may occur with  improper specimen collection/handling, submission of specimen other than nasopharyngeal swab, presence of viral mutation(s) within the areas targeted by this assay, and inadequate number of viral copies(<138 copies/mL). A negative result must be combined with clinical observations, patient history, and epidemiological information. The expected result is Negative.  Fact Sheet for Patients:  BloggerCourse.com  Fact Sheet for Healthcare Providers:  SeriousBroker.it  This test is no t yet approved or cleared by the [place] FDA and  has been authorized for detection and/or diagnosis of SARS-CoV-2 by FDA under an Emergency Use Authorization (EUA). This EUA will remain  in effect (meaning this test can be used) for the duration of the COVID-19 declaration under Section 564(b)(1) of the Act, 21 U.S.C.section 360bbb-3(b)(1), unless the authorization is terminated  or revoked sooner.       Influenza A by PCR NEGATIVE NEGATIVE Final   Influenza B by PCR NEGATIVE NEGATIVE Final    Comment: (NOTE) The Xpert Xpress SARS-CoV-2/FLU/RSV plus assay is intended as an aid in the diagnosis of influenza from Nasopharyngeal swab specimens and should not be used as a sole basis for treatment. Nasal washings and aspirates are unacceptable for Xpert Xpress SARS-CoV-2/FLU/RSV testing.  Fact Sheet for Patients: BloggerCourse.com  Fact Sheet for Healthcare Providers: SeriousBroker.it  This test is not yet approved or cleared by the [place] FDA and has been authorized for detection and/or diagnosis of SARS-CoV-2 by FDA under an Emergency Use Authorization (EUA). This EUA will remain in effect (meaning this test can be used) for the duration of the COVID-19 declaration  under Section 564(b)(1) of the Act, 21 U.S.C. section 360bbb-3(b)(1), unless the authorization is terminated or revoked.  Performed at [facility], [address], [place], [place] [address]   C Difficile Quick Screen w PCR reflex     Status: Abnormal   Collection Time: 01/24/20 11:40 AM   Specimen: STOOL  Result Value Ref Range Status   C Diff antigen POSITIVE (A) NEGATIVE Final   C Diff toxin NEGATIVE NEGATIVE Final   C Diff interpretation Results are indeterminate. See PCR results.  Final    Comment: Performed at [facility], [address], [place], [place] [address]  Gastrointestinal Panel by PCR , Stool     Status: None   Collection Time: 01/24/20 11:40 AM   Specimen: Stool  Result Value Ref Range Status   Campylobacter species NOT DETECTED NOT DETECTED Final   Plesimonas shigelloides NOT DETECTED NOT DETECTED Final   Salmonella species NOT DETECTED NOT DETECTED Final   Yersinia enterocolitica NOT DETECTED NOT DETECTED Final   Vibrio species NOT DETECTED NOT DETECTED Final   Vibrio cholerae NOT DETECTED NOT DETECTED Final   Enteroaggregative E coli (EAEC) NOT DETECTED NOT DETECTED Final   Enteropathogenic E coli (EPEC) NOT DETECTED NOT DETECTED Final   Enterotoxigenic E coli (ETEC) NOT DETECTED NOT DETECTED Final   Shiga like toxin producing E coli (STEC) NOT DETECTED NOT DETECTED Final   Shigella/Enteroinvasive E  coli (EIEC) NOT DETECTED NOT DETECTED Final   Cryptosporidium NOT DETECTED NOT DETECTED Final   Cyclospora cayetanensis NOT DETECTED NOT DETECTED Final   Entamoeba histolytica NOT DETECTED NOT DETECTED Final   Giardia lamblia NOT DETECTED NOT DETECTED Final   Adenovirus F40/41 NOT DETECTED NOT DETECTED Final   Astrovirus NOT DETECTED NOT DETECTED Final   Norovirus GI/GII NOT DETECTED NOT DETECTED Final   Rotavirus A NOT DETECTED NOT DETECTED Final   Sapovirus (I, II, IV, and V) NOT DETECTED NOT DETECTED Final    Comment:  Performed at [facility], [address], [place], [place] [address]  C. Diff by PCR, Reflexed     Status: None   Collection Time: 01/24/20 11:40 AM  Result Value Ref Range Status   Toxigenic C. Difficile by PCR NEGATIVE NEGATIVE Final    Comment: Patient is colonized with non toxigenic C. difficile. May not need treatment unless significant symptoms are present. Performed at [facility], [address] [place], [place] [address]      Labs: BNP (last 3 results) No results for input(s): BNP in the last 8760 hours. Basic Metabolic Panel: Recent Labs  Lab 01/22/20 2129 01/23/20 0630 01/24/20 0514 01/25/20 0909 01/26/20 0753  NA 139 137 139 139 142  K 4.2 4.5 3.7 3.8 3.8  CL 107 106 108 106 109  CO2 19* 20* 20* 21* 24  GLUCOSE 94 111* 105* 121* 107*  BUN 23 24* 18 11 12   CREATININE 1.58* 1.47* 1.31* 1.26* 1.26*  CALCIUM 9.1 8.5* 8.1* 8.4* 8.5*  MG  --   --   --  2.1  --    Liver Function Tests: Recent Labs  Lab 01/22/20 2129 01/23/20 0630 01/24/20 0514 01/25/20 0909 01/26/20 0753  AST 22 17 20 26  40  ALT 32 27 29 38 51*  ALKPHOS 71 59 52 63 64  BILITOT 1.3* 1.1 0.9 0.6 0.6  PROT 7.6 6.4* 6.0* 6.9 6.6  ALBUMIN 3.9 3.3* 3.2* 3.6 3.4*   Recent Labs  Lab 01/22/20 2129  LIPASE 34   No results for input(s): AMMONIA in the last 168 hours. CBC: Recent Labs  Lab 01/23/20 0630 01/24/20 0514 01/25/20 0909 01/26/20 0753 01/28/20 0905  WBC 8.0 6.9 7.0 6.4 5.7  NEUTROABS  --  5.1 5.5 4.7 4.0  HGB 14.4 13.1 14.1 14.0 14.8  HCT 43.1 39.3 41.4 41.4 43.6  MCV 93.1 91.8 90.2 92.2 91.2  PLT 176 173 210 219 267   Cardiac Enzymes: No results for input(s): CKTOTAL, CKMB, CKMBINDEX, TROPONINI in the last 168 hours. BNP: Invalid input(s): POCBNP CBG: Recent Labs  Lab 01/28/20 1148 01/28/20 1728 01/29/20 0011 01/29/20 0506 01/29/20 1204  GLUCAP 102* 113* 95 90 96   D-Dimer No results for input(s): DDIMER in the last 72 hours. Hgb A1c No results  for input(s): HGBA1C in the last 72 hours. Lipid Profile No results for input(s): CHOL, HDL, LDLCALC, TRIG, CHOLHDL, LDLDIRECT in the last 72 hours. Thyroid function studies No results for input(s): TSH, T4TOTAL, T3FREE, THYROIDAB in the last 72 hours.  Invalid input(s): FREET3 Anemia work up No results for input(s): VITAMINB12, FOLATE, FERRITIN, TIBC, IRON, RETICCTPCT in the last 72 hours. Urinalysis    Component Value Date/Time   COLORURINE AMBER (A) 01/22/2020 2129   APPEARANCEUR CLEAR 01/22/2020 2129   LABSPEC 1.039 (H) 01/22/2020 2129   PHURINE 5.0 01/22/2020 2129   GLUCOSEU NEGATIVE 01/22/2020 2129   HGBUR NEGATIVE 01/22/2020 2129   BILIRUBINUR NEGATIVE  01/22/2020 2129   KETONESUR 20 (A) 01/22/2020 2129   PROTEINUR 100 (A) 01/22/2020 2129   NITRITE NEGATIVE 01/22/2020 2129   LEUKOCYTESUR NEGATIVE 01/22/2020 2129   Sepsis Labs Invalid input(s): PROCALCITONIN,  WBC,  LACTICIDVEN Microbiology Recent Results (from the past 240 hour(s))  Resp Panel by RT-PCR (Flu A&B, Covid) Nasopharyngeal Swab     Status: None   Collection Time: 01/23/20  1:31 AM   Specimen: Nasopharyngeal Swab; Nasopharyngeal(NP) swabs in vial transport medium  Result Value Ref Range Status   SARS Coronavirus 2 by RT PCR NEGATIVE NEGATIVE Final    Comment: (NOTE) SARS-CoV-2 target nucleic acids are NOT DETECTED.  The SARS-CoV-2 RNA is generally detectable in upper respiratory specimens during the acute phase of infection. The lowest concentration of SARS-CoV-2 viral copies this assay can detect is 138 copies/mL. A negative result does not preclude SARS-Cov-2 infection and should not be used as the sole basis for treatment or other patient management decisions. A negative result may occur with  improper specimen collection/handling, submission of specimen other than nasopharyngeal swab, presence of viral mutation(s) within the areas targeted by this assay, and inadequate number of viral copies(<138  copies/mL). A negative result must be combined with clinical observations, patient history, and epidemiological information. The expected result is Negative.  Fact Sheet for Patients:  BloggerCourse.comhttps://www.fda.gov/media/152166/download  Fact Sheet for Healthcare Providers:  SeriousBroker.ithttps://www.fda.gov/media/152162/download  This test is no t yet approved or cleared by the [place] FDA and  has been authorized for detection and/or diagnosis of SARS-CoV-2 by FDA under an Emergency Use Authorization (EUA). This EUA will remain  in effect (meaning this test can be used) for the duration of the COVID-19 declaration under Section 564(b)(1) of the Act, 21 U.S.C.section 360bbb-3(b)(1), unless the authorization is terminated  or revoked sooner.       Influenza A by PCR NEGATIVE NEGATIVE Final   Influenza B by PCR NEGATIVE NEGATIVE Final    Comment: (NOTE) The Xpert Xpress SARS-CoV-2/FLU/RSV plus assay is intended as an aid in the diagnosis of influenza from Nasopharyngeal swab specimens and should not be used as a sole basis for treatment. Nasal washings and aspirates are unacceptable for Xpert Xpress SARS-CoV-2/FLU/RSV testing.  Fact Sheet for Patients: BloggerCourse.comhttps://www.fda.gov/media/152166/download  Fact Sheet for Healthcare Providers: SeriousBroker.ithttps://www.fda.gov/media/152162/download  This test is not yet approved or cleared by the [place] FDA and has been authorized for detection and/or diagnosis of SARS-CoV-2 by FDA under an Emergency Use Authorization (EUA). This EUA will remain in effect (meaning this test can be used) for the duration of the COVID-19 declaration under Section 564(b)(1) of the Act, 21 U.S.C. section 360bbb-3(b)(1), unless the authorization is terminated or revoked.  Performed at [facility], [address] [place], [place] [address]   C Difficile Quick Screen w PCR reflex     Status: Abnormal   Collection Time: 01/24/20 11:40 AM   Specimen: STOOL   Result Value Ref Range Status   C Diff antigen POSITIVE (A) NEGATIVE Final   C Diff toxin NEGATIVE NEGATIVE Final   C Diff interpretation Results are indeterminate. See PCR results.  Final    Comment: Performed at [facility], [address] [place], [place] [address]  Gastrointestinal Panel by PCR , Stool     Status: None   Collection Time: 01/24/20 11:40 AM   Specimen: Stool  Result Value Ref Range Status   Campylobacter species NOT DETECTED NOT DETECTED Final   Plesimonas shigelloides NOT DETECTED NOT DETECTED Final  Salmonella species NOT DETECTED NOT DETECTED Final   Yersinia enterocolitica NOT DETECTED NOT DETECTED Final   Vibrio species NOT DETECTED NOT DETECTED Final   Vibrio cholerae NOT DETECTED NOT DETECTED Final   Enteroaggregative E coli (EAEC) NOT DETECTED NOT DETECTED Final   Enteropathogenic E coli (EPEC) NOT DETECTED NOT DETECTED Final   Enterotoxigenic E coli (ETEC) NOT DETECTED NOT DETECTED Final   Shiga like toxin producing E coli (STEC) NOT DETECTED NOT DETECTED Final   Shigella/Enteroinvasive E coli (EIEC) NOT DETECTED NOT DETECTED Final   Cryptosporidium NOT DETECTED NOT DETECTED Final   Cyclospora cayetanensis NOT DETECTED NOT DETECTED Final   Entamoeba histolytica NOT DETECTED NOT DETECTED Final   Giardia lamblia NOT DETECTED NOT DETECTED Final   Adenovirus F40/41 NOT DETECTED NOT DETECTED Final   Astrovirus NOT DETECTED NOT DETECTED Final   Norovirus GI/GII NOT DETECTED NOT DETECTED Final   Rotavirus A NOT DETECTED NOT DETECTED Final   Sapovirus (I, II, IV, and V) NOT DETECTED NOT DETECTED Final    Comment: Performed at [facility], [address] [place], [place] [address]  C. Diff by PCR, Reflexed     Status: None   Collection Time: 01/24/20 11:40 AM  Result Value Ref Range Status   Toxigenic C. Difficile by PCR NEGATIVE NEGATIVE Final    Comment: Patient is colonized with non toxigenic C. difficile. May not need  treatment unless significant symptoms are present. Performed at [facility], [address], [place], [place] [address]      Time coordinating discharge: 35 minutes  SIGNED:   Jacquelin Hawking, MD [facility] 01/29/2020, 1:23 PM

## 2020-01-29 NOTE — Progress Notes (Signed)
Subjective/Chief Complaint: Patient denies abdominal pain.  His bowel movements have normalized.  He is tolerating a soft diet without nausea or vomiting.   Objective: Vital signs in last 24 hours: Temp:  [98.1 F (36.7 C)-98.6 F (37 C)] 98.2 F (36.8 C) (01/09 0505) Pulse Rate:  [60-76] 60 (01/09 0505) Resp:  [18] 18 (01/09 0505) BP: (150-159)/(81-98) 150/81 (01/09 0505) SpO2:  [97 %-98 %] 97 % (01/09 0505) Last BM Date: 01/28/20  Intake/Output from previous day: 01/08 0701 - 01/09 0700 In: 360 [P.O.:360] Out: -  Intake/Output this shift: No intake/output data recorded.   General: pleasant, WD,WNmale who is laying in bed in NAD Heart: regular, rate, and rhythm.  Lungs: CTAB, no wheezes, rhonchi, or rales noted. Respiratory effort nonlabored Abd: soft,mild ttp suprapubic abdomen and LLQ,moderately distended, +BS, no masses, hernias, or organomegaly MS: all 4 extremities are symmetrical with no cyanosis, clubbing, or edema. Skin: warm and dry with no masses, lesions, or rashes Neuro: Cranial nerves 2-12 grossly intact, sensation is normal throughout Psych: A&Ox3 with an appropriate affect Lab Results:  Recent Labs    01/28/20 0905  WBC 5.7  HGB 14.8  HCT 43.6  PLT 267   BMET No results for input(s): NA, K, CL, CO2, GLUCOSE, BUN, CREATININE, CALCIUM in the last 72 hours. PT/INR No results for input(s): LABPROT, INR in the last 72 hours. ABG No results for input(s): PHART, HCO3 in the last 72 hours.  Invalid input(s): PCO2, PO2  Studies/Results: No results found.  Anti-infectives: Anti-infectives (From admission, onward)   Start     Dose/Rate Route Frequency Ordered Stop   01/26/20 1400  amoxicillin-clavulanate (AUGMENTIN) 875-125 MG per tablet 1 tablet        1 tablet Oral Every 12 hours 01/26/20 1208     01/24/20 1500  vancomycin (VANCOCIN) 50 mg/mL oral solution 125 mg  Status:  Discontinued        125 mg Oral 4 times daily 01/24/20 1354 01/24/20  1430   01/23/20 1000  metroNIDAZOLE (FLAGYL) IVPB 500 mg  Status:  Discontinued        500 mg 100 mL/hr over 60 Minutes Intravenous Every 8 hours 01/23/20 0510 01/26/20 1208   01/23/20 0600  cefTRIAXone (ROCEPHIN) 2 g in sodium chloride 0.9 % 100 mL IVPB  Status:  Discontinued        2 g 200 mL/hr over 30 Minutes Intravenous Every 24 hours 01/23/20 0510 01/26/20 1208   01/23/20 0130  ceFEPIme (MAXIPIME) 2 g in sodium chloride 0.9 % 100 mL IVPB       "And" Linked Group Details   2 g 200 mL/hr over 30 Minutes Intravenous  Once 01/23/20 0124 01/23/20 0239   01/23/20 0130  metroNIDAZOLE (FLAGYL) IVPB 500 mg       "And" Linked Group Details   500 mg 100 mL/hr over 60 Minutes Intravenous  Once 01/23/20 0124 01/23/20 0240      Assessment/Plan: HTN  HLD CKD stage III Aortic insufficiency  Atrial fibrillation on eliquis  Sigmoid diverticulitis with abscess in small bowel mesentery - abscess not amenable to drainage - no leukocytosis, afebrile, HD stable -Diarrhea resolved.  No need to test for C. difficile at this point- -no acute surgical intervention planned at this time - stable for discharge  Will arrange follow up as outpatient at [facility] -  We will arrange outpatient imaging as well.  He will need to see a colorectal expert in follow-up.  Continue antibiotics at this point  time for a total of 14 days total of treatment.    LOS: 6 days    Dortha Schwalbe MD  01/29/2020

## 2020-01-29 NOTE — Progress Notes (Signed)
Pt discharged home with spouse in stable condition. Discharge instructions given. Scripts sent to pharmacy of choice. No immediate questions or concerns at this time. Pt opted to ambulate off the department.

## 2020-01-30 ENCOUNTER — Other Ambulatory Visit: Payer: Self-pay | Admitting: Surgery

## 2020-01-30 DIAGNOSIS — K651 Peritoneal abscess: Secondary | ICD-10-CM

## 2020-02-01 ENCOUNTER — Other Ambulatory Visit: Payer: PRIVATE HEALTH INSURANCE

## 2020-02-01 ENCOUNTER — Inpatient Hospital Stay: Admission: RE | Admit: 2020-02-01 | Payer: PRIVATE HEALTH INSURANCE | Source: Ambulatory Visit

## 2020-02-09 ENCOUNTER — Telehealth: Payer: Self-pay

## 2020-02-09 NOTE — Telephone Encounter (Signed)
Patient called stating his CT scan has been schedules on 1/31 however follow up with Dr. Renold Don is scheduled for 1/28. Inquired about rescheduling appointment with Dr. Renold Don for after the CT scan. Per Dr. Orlando Penner notes, he wants to see patient 3-5 days after his scan to assess. Forwarding to referral coordinator to assist with reschedule.   Idalie Canto [facility] Mast, RN

## 2020-02-17 ENCOUNTER — Encounter: Payer: Self-pay | Admitting: Internal Medicine

## 2020-02-17 ENCOUNTER — Ambulatory Visit (INDEPENDENT_AMBULATORY_CARE_PROVIDER_SITE_OTHER): Payer: PRIVATE HEALTH INSURANCE | Admitting: Internal Medicine

## 2020-02-17 ENCOUNTER — Other Ambulatory Visit: Payer: Self-pay

## 2020-02-17 VITALS — BP 119/73 | HR 61 | Ht 68.0 in | Wt 181.0 lb

## 2020-02-17 DIAGNOSIS — K5792 Diverticulitis of intestine, part unspecified, without perforation or abscess without bleeding: Secondary | ICD-10-CM

## 2020-02-17 DIAGNOSIS — K651 Peritoneal abscess: Secondary | ICD-10-CM | POA: Diagnosis not present

## 2020-02-17 MED ORDER — AMOXICILLIN-POT CLAVULANATE 875-125 MG PO TABS: 1.0000 | ORAL_TABLET | Freq: Two times a day (BID) | ORAL | 0 refills | Status: DC

## 2020-02-17 NOTE — Progress Notes (Signed)
[facility]  Patient Active Problem List   Diagnosis Date Noted  . Diarrhea   . Diverticulitis 01/23/2020  . Diverticulitis of large intestine with perforation and abscess without bleeding 01/23/2020  . Intra-abdominal abscess (HCC) 01/23/2020  . Educated about COVID-19 virus infection 09/28/2019  . Chronic kidney disease (CKD), stage II (mild) 09/28/2019  . Elevated troponin 09/28/2019  . Nonrheumatic aortic valve insufficiency 01/07/2018  . Abnormal EKG 01/07/2018  . Chest pressure   . A-fib (HCC) 12/20/2017  . HTN (hypertension) 12/20/2017  . HLD (hyperlipidemia) 12/20/2017  . CKD (chronic kidney disease) stage 3, GFR 30-59 ml/min (HCC) 12/20/2017      Subjective:    Patient ID: Fanny Skates, male    DOB: 1950-09-05, 70 y.o.   MRN: [id number]  Cc: f/u intraabdominal abscess  HPI:  MCKENZIE TORUNO is a 70 y.o. male pmh ckd3, HFpEF, diverticulosis admitted 01/22/2020 for 4 days acute onset abd pain and watery nonbloody diarrhea, along with subjective f/c, found to have complicated diverticulitis. Admitted 1/2; discharged 1/09. Here for outpatient id f/u  02/17/20 Reviewed hospital discharge summary Patient has 3 more days of abx Doing very well No gi sx Eating well No abd ct follow up yet No drain previously  Background: ------------- He was on vacation with his wife. 4 days prior to admission had acute upper abd pain, this quickly progressed to become lower abdominal pain. The next day he developed watery diarrhea about 10 times a day. Nonbloody. He also endorsed associated subjective chill/fever but no rigor. His appetite is decreased. No sob/chest pain, rash, joint pain, headache  His wife is fine.   He doesn't have any abx prior month  No frequent hospital admission  Has had colonoscopies in the past. Initially with polyps resected and last colonsocopy per his report "clean." known diverticular disease. No hx  diverticulitis  On admission there is no documented fever; his wbc was only 8. His cr was 1.5 (around baseline), normal sodium and lft. No blood culture abd ct showed ruptured diverticulitis Ir/surgery consulted Abscess not amenable to drainage  Today he reports abd pain better, appetite improving, overall feelign better. However his diarrhea frequency remains the same  He denies family hx crohn's or inflammatory bowel disease   cdiff w/u negative Diarrhea resolved during admission    Allergies  Allergen Reactions  . Amlodipine Palpitations  . Lisinopril Other (See Comments)    Elevated  creatinine       Outpatient Medications Prior to Visit  Medication Sig Dispense Refill  . acetaminophen (TYLENOL) 650 MG CR tablet Take 1,300 mg by mouth in the morning, at noon, and at bedtime.    Marland Kitchen amoxicillin-clavulanate (AUGMENTIN) 875-125 MG tablet Take 1 tablet by mouth 2 (two) times daily for 21 days. 42 tablet 0  . Cholecalciferol (VITAMIN D3) 125 MCG (5000 UT) CAPS Take 1 capsule by mouth daily.    Marland Kitchen diltiazem (CARDIZEM CD) 240 MG 24 hr capsule Take 1 capsule (240 mg total) by mouth daily. 90 capsule 0  . ELIQUIS 5 MG TABS tablet TAKE 1 TABLET BY MOUTH TWICE A DAY 60 tablet 2  . metoprolol tartrate (LOPRESSOR) 25 MG tablet Take 25 mg by mouth 2 (two) times daily.    . tamsulosin (FLOMAX) 0.4 MG CAPS capsule Take 0.4 mg by mouth daily.    Marland Kitchen testosterone (ANDROGEL) 50 MG/5GM (1%) GEL APPLY 1 TUBE ONCE A DAY AS DIRECTED  No facility-administered medications prior to visit.     Social History   Socioeconomic History  . Marital status: Married    Spouse name: Not on file  . Number of children: 2  . Years of education: Not on file  . Highest education level: Not on file  Occupational History  . Occupation: Cabin crew (retired)  Tobacco Use  . Smoking status: Never Smoker  . Smokeless tobacco: Never Used  Vaping Use  . Vaping Use: Never used  Substance and Sexual Activity   . Alcohol use: Yes  . Drug use: Never  . Sexual activity: Yes  Other Topics Concern  . Not on file  Social History Narrative  . Not on file   Social Determinants of Health   Financial Resource Strain: Not on file  Food Insecurity: Not on file  Transportation Needs: Not on file  Physical Activity: Not on file  Stress: Not on file  Social Connections: Not on file  Intimate Partner Violence: Not on file      Review of Systems   no distress Heent: atraumatic; conj clear cv rrr no mrg Lungs clear abd s/nt Neuro nonfocal Skin no rash  Objective:    BP 119/73   Pulse 61   Ht 5\' 8"  (1.727 m)   Wt 181 lb (82.1 kg)   BMI 27.52 kg/m  Nursing note and vital signs reviewed.  Physical Exam       Labs: Serology: cdiff screen -- antigen positive, toxin/pcr negative  Imaging: 01/23/20 abd ct Perforated sigmoid diverticulitis with 4.2 cm abscess within the small bowel mesentery.  Extensive small-bowel inflammation adjacent to the a diverticular abscess with resultant high-grade partial small bowel obstruction involving the distal small bowel. Extensive hyperemia of the distal small bowel, likely reactive to the adjacent inflammatory process within the mesentery.  Background severe descending and sigmoid colonic diverticulosis.   01/26/20 abd ct I personally reviewed; slight iproved abscess size otherwise stable Acute sigmoid diverticulitis with a persistent 3.2 x 3.1 cm diameter abscess adjacent to a small bowel loop in the central upper pelvis.  Associated small-bowel obstruction due to edema of adjacent small bowel loop.  Small amount of free pelvic fluid without free air.  Small RIGHT inguinal hernia containing fat.  Tiny nonobstructing upper pole LEFT renal calculus.  Assessment & Plan:   Problem List Items Addressed This Visit      Other   Diverticulitis - Primary   Intra-abdominal abscess (HCC)      Abx: 1/06-c augmentin  1/2-1/06  ceftriaxone/flagyl 1/2 cefepime   Assessment: 69 y.o.maleckd3, HFpEF, diverticulosis admitted 01/22/2020 for 4 days acute onset abd pain and watery nonbloody diarrhea, along with subjective f/c, found to have complicated diverticulitis. Id consulted for this along with question of cdiff in setting of positive antigen, negative toxin. Imaging c/w complicated cdiff with intraabdominal abscess  His diarrhea improved. No evidence of cdiff by toxin or pcr. Colonized with nonpathogenic cdiff  ------------ 1/28 id f/u Improving clinically. He feels a 100% of baseline health. However, he has no folow up abd ct since discharge. He is on his 4th week of amox-clav     Plan: Do abd ct in 3 days Will extend amox-clav for another 4 weeks Will review the ct scan and call patient to discuss final treatment plan  If abd ct abnormal still, will have another 4 week follow up visit    Follow-up: No follow-ups on file.    I spent more than 20 minute reviewing data/chart,  and coordinating care and >50% direct face to face time providing counseling/discussing diagnostics/treatment plan with patient   Raymondo Band, MD [facility] [phone]  pager   [phone] cell 02/17/2020, 3:03 PM

## 2020-02-17 NOTE — Patient Instructions (Signed)
Will give you another 4 weeks of amoxicillin-clavulonate. You might not have to take them all. I would like to see inflammatory marker tests and repeat abdominal ct before I make final decision on duration of treatment   I will call you middle of next week to discuss (if you haven't heard from me, please send me a mychart message to regroup)  Thank you

## 2020-02-18 LAB — C-REACTIVE PROTEIN: CRP: 2.6 mg/L (ref <8.0)

## 2020-02-18 LAB — CBC WITH DIFFERENTIAL/PLATELET
Absolute Monocytes: 574 cells/uL (ref 200–950)
Basophils Absolute: 52 cells/uL (ref 0–200)
Basophils Relative: 0.9 %
Eosinophils Absolute: 139 cells/uL (ref 15–500)
Eosinophils Relative: 2.4 %
HCT: 43.5 % (ref 38.5–50.0)
Hemoglobin: 14.8 g/dL (ref 13.2–17.1)
Lymphs Abs: 1444 cells/uL (ref 850–3900)
MCH: 30.7 pg (ref 27.0–33.0)
MCHC: 34 g/dL (ref 32.0–36.0)
MCV: 90.2 fL (ref 80.0–100.0)
MPV: 9.9 fL (ref 7.5–12.5)
Monocytes Relative: 9.9 %
Neutro Abs: 3590 cells/uL (ref 1500–7800)
Neutrophils Relative %: 61.9 %
Platelets: 191 10*3/uL (ref 140–400)
RBC: 4.82 10*6/uL (ref 4.20–5.80)
RDW: 13.7 % (ref 11.0–15.0)
Total Lymphocyte: 24.9 %
WBC: 5.8 10*3/uL (ref 3.8–10.8)

## 2020-02-20 ENCOUNTER — Other Ambulatory Visit: Payer: Self-pay

## 2020-02-20 ENCOUNTER — Ambulatory Visit (HOSPITAL_COMMUNITY)
Admission: RE | Admit: 2020-02-20 | Discharge: 2020-02-20 | Disposition: A | Discharge status: Home / Self Care | Payer: No Typology Code available for payment source | Source: Ambulatory Visit | Attending: Surgery | Admitting: Surgery

## 2020-02-20 ENCOUNTER — Encounter (HOSPITAL_COMMUNITY): Payer: Self-pay

## 2020-02-20 DIAGNOSIS — K651 Peritoneal abscess: Secondary | ICD-10-CM | POA: Insufficient documentation

## 2020-02-20 MED ORDER — IOHEXOL 300 MG/ML  SOLN
100.0000 mL | Freq: Once | INTRAMUSCULAR | Status: AC | PRN
  Administered 2020-02-20: 100 mL via INTRAVENOUS

## 2020-02-21 ENCOUNTER — Telehealth: Payer: Self-pay | Admitting: *Deleted

## 2020-02-21 NOTE — Telephone Encounter (Signed)
Patient with diagnosis of a fib on Eliquis for anticoagulation.    1. Procedure: Sigmoidectomy  Date of procedure: TBD   CHA2DS2-VASc Score = 2  This indicates a 2.2% annual risk of stroke. The patient's score is based upon: CHF History: No HTN History: Yes Diabetes History: No Stroke History: No Vascular Disease History: No Age Score: 1 Gender Score: 0   CrCl 21mL/min Platelet count 191K   Per office protocol, patient can hold Eliquis for 3 days prior to procedure.    Patient will not need bridging with Lovenox (enoxaparin) around procedure.

## 2020-02-21 NOTE — Telephone Encounter (Signed)
Pharmacy please comment on anticoagulation and then we will contact the patient for clearance.  Corine Shelter PA-C 02/21/2020 3:22 PM

## 2020-02-21 NOTE — Telephone Encounter (Signed)
   [facility] Pre-operative Risk Assessment    HEARTCARE STAFF: - Please ensure there is not already an duplicate clearance open for this procedure. - Under Visit Info/Reason for Call, type in Other and utilize the format Clearance MM/DD/YY or Clearance TBD. Do not use dashes or single digits. - If request is for dental extraction, please clarify the # of teeth to be extracted.  Request for surgical clearance:  1. What type of surgery is being performed? Sigmoldectomy   2. When is this surgery scheduled? TBD   3. What type of clearance is required (medical clearance vs. Pharmacy clearance to hold med vs. Both)? Both  4. Are there any medications that need to be held prior to surgery and how long? Eliquis   5. Practice name and name of physician performing surgery? [facility]   6. What is the office phone number? [phone]   7.   What is the office fax number? [phone]  8.   Anesthesia type (None, local, MAC, general) ? general   Barbaraann Barthel 02/21/2020, 3:00 PM  _________________________________________________________________   (provider comments below)

## 2020-02-22 NOTE — Telephone Encounter (Signed)
   Primary Cardiologist: Rollene Rotunda, MD  Chart reviewed and patient called today part of pre-operative protocol coverage. Given past medical history and time since last visit, based on ACC/AHA guidelines, Daniel Aguirre would be at acceptable risk for the planned procedure without further cardiovascular testing.   Ok to hold Eliquis 3 days pre op and resume as soon as safe post op.  The patient was advised that if he develops new symptoms prior to surgery to contact our office to arrange for a follow-up visit, and he verbalized understanding.  I will route this recommendation to the requesting party via Epic fax function and remove from pre-op pool.  Please call with questions.  Corine Shelter, PA-C 02/22/2020, 8:28 AM

## 2020-03-19 ENCOUNTER — Inpatient Hospital Stay (HOSPITAL_COMMUNITY)
Admission: EM | Admit: 2020-03-19 | Discharge: 2020-04-02 | DRG: 329 | Disposition: A | Discharge status: Home Health | Payer: No Typology Code available for payment source | Attending: Internal Medicine | Admitting: Internal Medicine

## 2020-03-19 ENCOUNTER — Encounter (HOSPITAL_COMMUNITY): Payer: Self-pay

## 2020-03-19 ENCOUNTER — Telehealth: Payer: Self-pay | Admitting: General Surgery

## 2020-03-19 ENCOUNTER — Other Ambulatory Visit: Payer: Self-pay

## 2020-03-19 DIAGNOSIS — Z7901 Long term (current) use of anticoagulants: Secondary | ICD-10-CM

## 2020-03-19 DIAGNOSIS — K5721 Diverticulitis of large intestine with perforation and abscess with bleeding: Principal | ICD-10-CM | POA: Diagnosis present

## 2020-03-19 DIAGNOSIS — N182 Chronic kidney disease, stage 2 (mild): Secondary | ICD-10-CM | POA: Diagnosis present

## 2020-03-19 DIAGNOSIS — K651 Peritoneal abscess: Secondary | ICD-10-CM | POA: Diagnosis present

## 2020-03-19 DIAGNOSIS — K9189 Other postprocedural complications and disorders of digestive system: Secondary | ICD-10-CM | POA: Diagnosis not present

## 2020-03-19 DIAGNOSIS — I129 Hypertensive chronic kidney disease with stage 1 through stage 4 chronic kidney disease, or unspecified chronic kidney disease: Secondary | ICD-10-CM | POA: Diagnosis present

## 2020-03-19 DIAGNOSIS — Z20822 Contact with and (suspected) exposure to covid-19: Secondary | ICD-10-CM | POA: Diagnosis present

## 2020-03-19 DIAGNOSIS — K812 Acute cholecystitis with chronic cholecystitis: Secondary | ICD-10-CM | POA: Diagnosis present

## 2020-03-19 DIAGNOSIS — E559 Vitamin D deficiency, unspecified: Secondary | ICD-10-CM | POA: Diagnosis present

## 2020-03-19 DIAGNOSIS — I973 Postprocedural hypertension: Secondary | ICD-10-CM | POA: Diagnosis present

## 2020-03-19 DIAGNOSIS — K631 Perforation of intestine (nontraumatic): Secondary | ICD-10-CM | POA: Diagnosis present

## 2020-03-19 DIAGNOSIS — K567 Ileus, unspecified: Secondary | ICD-10-CM | POA: Diagnosis not present

## 2020-03-19 DIAGNOSIS — I351 Nonrheumatic aortic (valve) insufficiency: Secondary | ICD-10-CM | POA: Diagnosis present

## 2020-03-19 DIAGNOSIS — I480 Paroxysmal atrial fibrillation: Secondary | ICD-10-CM | POA: Diagnosis present

## 2020-03-19 DIAGNOSIS — K56609 Unspecified intestinal obstruction, unspecified as to partial versus complete obstruction: Secondary | ICD-10-CM | POA: Diagnosis present

## 2020-03-19 DIAGNOSIS — Z823 Family history of stroke: Secondary | ICD-10-CM

## 2020-03-19 DIAGNOSIS — E291 Testicular hypofunction: Secondary | ICD-10-CM | POA: Diagnosis present

## 2020-03-19 DIAGNOSIS — Z888 Allergy status to other drugs, medicaments and biological substances status: Secondary | ICD-10-CM

## 2020-03-19 DIAGNOSIS — I471 Supraventricular tachycardia: Secondary | ICD-10-CM | POA: Diagnosis present

## 2020-03-19 DIAGNOSIS — I4891 Unspecified atrial fibrillation: Secondary | ICD-10-CM | POA: Diagnosis present

## 2020-03-19 DIAGNOSIS — Z8249 Family history of ischemic heart disease and other diseases of the circulatory system: Secondary | ICD-10-CM

## 2020-03-19 DIAGNOSIS — K5720 Diverticulitis of large intestine with perforation and abscess without bleeding: Secondary | ICD-10-CM | POA: Diagnosis present

## 2020-03-19 DIAGNOSIS — Z933 Colostomy status: Secondary | ICD-10-CM

## 2020-03-19 DIAGNOSIS — N400 Enlarged prostate without lower urinary tract symptoms: Secondary | ICD-10-CM | POA: Diagnosis present

## 2020-03-19 DIAGNOSIS — N1831 Chronic kidney disease, stage 3a: Secondary | ICD-10-CM | POA: Diagnosis present

## 2020-03-19 DIAGNOSIS — K668 Other specified disorders of peritoneum: Secondary | ICD-10-CM | POA: Diagnosis not present

## 2020-03-19 DIAGNOSIS — T501X5A Adverse effect of loop [high-ceiling] diuretics, initial encounter: Secondary | ICD-10-CM | POA: Diagnosis present

## 2020-03-19 DIAGNOSIS — Z7952 Long term (current) use of systemic steroids: Secondary | ICD-10-CM

## 2020-03-19 DIAGNOSIS — I160 Hypertensive urgency: Secondary | ICD-10-CM | POA: Diagnosis not present

## 2020-03-19 DIAGNOSIS — R400 Somnolence: Secondary | ICD-10-CM | POA: Diagnosis present

## 2020-03-19 DIAGNOSIS — Z79899 Other long term (current) drug therapy: Secondary | ICD-10-CM

## 2020-03-19 DIAGNOSIS — E785 Hyperlipidemia, unspecified: Secondary | ICD-10-CM | POA: Diagnosis present

## 2020-03-19 DIAGNOSIS — H9319 Tinnitus, unspecified ear: Secondary | ICD-10-CM | POA: Diagnosis present

## 2020-03-19 DIAGNOSIS — I452 Bifascicular block: Secondary | ICD-10-CM | POA: Diagnosis present

## 2020-03-19 DIAGNOSIS — I10 Essential (primary) hypertension: Secondary | ICD-10-CM | POA: Diagnosis present

## 2020-03-19 DIAGNOSIS — E8770 Fluid overload, unspecified: Secondary | ICD-10-CM | POA: Diagnosis not present

## 2020-03-19 DIAGNOSIS — I959 Hypotension, unspecified: Secondary | ICD-10-CM | POA: Diagnosis not present

## 2020-03-19 DIAGNOSIS — I82612 Acute embolism and thrombosis of superficial veins of left upper extremity: Secondary | ICD-10-CM | POA: Diagnosis not present

## 2020-03-19 DIAGNOSIS — N183 Chronic kidney disease, stage 3 unspecified: Secondary | ICD-10-CM | POA: Diagnosis present

## 2020-03-19 DIAGNOSIS — Z87442 Personal history of urinary calculi: Secondary | ICD-10-CM

## 2020-03-19 NOTE — Telephone Encounter (Signed)
Return patient's call.  He had been hospitalized in January for sigmoid diverticulitis with abscess treated with antibiotics.  He had a follow-up CT scan that showed resolution.  He had seen our partner Dr. Cliffton Asters in the office to discuss elective sigmoid colectomy and was being set up for updated colonoscopy with [facility] gastroenterology.  He states that he had a sharp pain in his stomach in his upper abdomen around February 18.  He resumed some Augmentin that he had leftover.  He took it for about 1 week.  This past Saturday he states that he developed a rash in his groin, armpit, feet and hands.  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  He took a dose on Sunday and Monday.  He states that he developed a stomach ache/discomfort in his upper abdomen this morning while at work but toughed it out.  This evening he states his pain is much more severe.  It is in his upper abdomen.  Is a stabbing sensation.  He has had some nausea but no fever or chills.  It is in a different location than his diverticulitis pain.  I advised him that he needs to come to the emergency room for evaluation and further work-up.  I discussed with his wife.  She is going to be bringing him by private vehicle.  I alerted one of the EDP physicians  Mary Sella. Andrey Campanile, MD, FACS General, Bariatric, & Minimally Invasive Surgery [facility], [place]

## 2020-03-19 NOTE — ED Triage Notes (Signed)
Pt sts upper abdominal pain "stabbing" sensation. Sts the pain is more intense than the diverticulitis pain he had and was admitted for in January. Pt sent here by Dr. on call with [facility] for further evaluation.

## 2020-03-20 ENCOUNTER — Emergency Department (HOSPITAL_COMMUNITY): Payer: No Typology Code available for payment source

## 2020-03-20 ENCOUNTER — Encounter (HOSPITAL_COMMUNITY): Payer: Self-pay

## 2020-03-20 DIAGNOSIS — K567 Ileus, unspecified: Secondary | ICD-10-CM | POA: Diagnosis not present

## 2020-03-20 DIAGNOSIS — Z7901 Long term (current) use of anticoagulants: Secondary | ICD-10-CM | POA: Diagnosis not present

## 2020-03-20 DIAGNOSIS — N182 Chronic kidney disease, stage 2 (mild): Secondary | ICD-10-CM | POA: Diagnosis not present

## 2020-03-20 DIAGNOSIS — I129 Hypertensive chronic kidney disease with stage 1 through stage 4 chronic kidney disease, or unspecified chronic kidney disease: Secondary | ICD-10-CM | POA: Diagnosis present

## 2020-03-20 DIAGNOSIS — I471 Supraventricular tachycardia: Secondary | ICD-10-CM | POA: Diagnosis present

## 2020-03-20 DIAGNOSIS — N179 Acute kidney failure, unspecified: Secondary | ICD-10-CM | POA: Diagnosis not present

## 2020-03-20 DIAGNOSIS — K651 Peritoneal abscess: Secondary | ICD-10-CM | POA: Diagnosis present

## 2020-03-20 DIAGNOSIS — Z79899 Other long term (current) drug therapy: Secondary | ICD-10-CM | POA: Diagnosis not present

## 2020-03-20 DIAGNOSIS — N189 Chronic kidney disease, unspecified: Secondary | ICD-10-CM | POA: Diagnosis not present

## 2020-03-20 DIAGNOSIS — I10 Essential (primary) hypertension: Secondary | ICD-10-CM | POA: Diagnosis not present

## 2020-03-20 DIAGNOSIS — K668 Other specified disorders of peritoneum: Secondary | ICD-10-CM | POA: Diagnosis present

## 2020-03-20 DIAGNOSIS — E785 Hyperlipidemia, unspecified: Secondary | ICD-10-CM | POA: Diagnosis present

## 2020-03-20 DIAGNOSIS — N1831 Chronic kidney disease, stage 3a: Secondary | ICD-10-CM | POA: Diagnosis present

## 2020-03-20 DIAGNOSIS — I959 Hypotension, unspecified: Secondary | ICD-10-CM | POA: Diagnosis not present

## 2020-03-20 DIAGNOSIS — K812 Acute cholecystitis with chronic cholecystitis: Secondary | ICD-10-CM | POA: Diagnosis present

## 2020-03-20 DIAGNOSIS — Z823 Family history of stroke: Secondary | ICD-10-CM | POA: Diagnosis not present

## 2020-03-20 DIAGNOSIS — E8770 Fluid overload, unspecified: Secondary | ICD-10-CM | POA: Diagnosis not present

## 2020-03-20 DIAGNOSIS — L538 Other specified erythematous conditions: Secondary | ICD-10-CM | POA: Diagnosis not present

## 2020-03-20 DIAGNOSIS — R609 Edema, unspecified: Secondary | ICD-10-CM | POA: Diagnosis not present

## 2020-03-20 DIAGNOSIS — M7989 Other specified soft tissue disorders: Secondary | ICD-10-CM | POA: Diagnosis not present

## 2020-03-20 DIAGNOSIS — Z20822 Contact with and (suspected) exposure to covid-19: Secondary | ICD-10-CM | POA: Diagnosis present

## 2020-03-20 DIAGNOSIS — I452 Bifascicular block: Secondary | ICD-10-CM | POA: Diagnosis present

## 2020-03-20 DIAGNOSIS — K5721 Diverticulitis of large intestine with perforation and abscess with bleeding: Secondary | ICD-10-CM | POA: Diagnosis present

## 2020-03-20 DIAGNOSIS — K56609 Unspecified intestinal obstruction, unspecified as to partial versus complete obstruction: Secondary | ICD-10-CM | POA: Diagnosis present

## 2020-03-20 DIAGNOSIS — I351 Nonrheumatic aortic (valve) insufficiency: Secondary | ICD-10-CM | POA: Diagnosis present

## 2020-03-20 DIAGNOSIS — K631 Perforation of intestine (nontraumatic): Secondary | ICD-10-CM

## 2020-03-20 DIAGNOSIS — I4891 Unspecified atrial fibrillation: Secondary | ICD-10-CM | POA: Diagnosis not present

## 2020-03-20 DIAGNOSIS — I480 Paroxysmal atrial fibrillation: Secondary | ICD-10-CM | POA: Diagnosis present

## 2020-03-20 DIAGNOSIS — I160 Hypertensive urgency: Secondary | ICD-10-CM | POA: Diagnosis not present

## 2020-03-20 DIAGNOSIS — K9189 Other postprocedural complications and disorders of digestive system: Secondary | ICD-10-CM | POA: Diagnosis not present

## 2020-03-20 DIAGNOSIS — K5720 Diverticulitis of large intestine with perforation and abscess without bleeding: Secondary | ICD-10-CM | POA: Diagnosis not present

## 2020-03-20 DIAGNOSIS — I82612 Acute embolism and thrombosis of superficial veins of left upper extremity: Secondary | ICD-10-CM | POA: Diagnosis not present

## 2020-03-20 DIAGNOSIS — Z7952 Long term (current) use of systemic steroids: Secondary | ICD-10-CM | POA: Diagnosis not present

## 2020-03-20 DIAGNOSIS — Z888 Allergy status to other drugs, medicaments and biological substances status: Secondary | ICD-10-CM | POA: Diagnosis not present

## 2020-03-20 LAB — URINALYSIS, ROUTINE W REFLEX MICROSCOPIC
Bacteria, UA: NONE SEEN
Bilirubin Urine: NEGATIVE
Glucose, UA: NEGATIVE mg/dL
Hgb urine dipstick: NEGATIVE
Ketones, ur: NEGATIVE mg/dL
Leukocytes,Ua: NEGATIVE
Nitrite: NEGATIVE
Protein, ur: NEGATIVE mg/dL
Specific Gravity, Urine: 1.035 — ABNORMAL HIGH (ref 1.005–1.030)
pH: 5 (ref 5.0–8.0)

## 2020-03-20 LAB — CBC WITH DIFFERENTIAL/PLATELET
Abs Immature Granulocytes: 0.13 10*3/uL — ABNORMAL HIGH (ref 0.00–0.07)
Basophils Absolute: 0 10*3/uL (ref 0.0–0.1)
Basophils Relative: 0 %
Eosinophils Absolute: 0 10*3/uL (ref 0.0–0.5)
Eosinophils Relative: 0 %
HCT: 45.9 % (ref 39.0–52.0)
Hemoglobin: 15.7 g/dL (ref 13.0–17.0)
Immature Granulocytes: 1 %
Lymphocytes Relative: 8 %
Lymphs Abs: 1.5 10*3/uL (ref 0.7–4.0)
MCH: 31.1 pg (ref 26.0–34.0)
MCHC: 34.2 g/dL (ref 30.0–36.0)
MCV: 90.9 fL (ref 80.0–100.0)
Monocytes Absolute: 1.3 10*3/uL — ABNORMAL HIGH (ref 0.1–1.0)
Monocytes Relative: 7 %
Neutro Abs: 15.4 10*3/uL — ABNORMAL HIGH (ref 1.7–7.7)
Neutrophils Relative %: 84 %
Platelets: 253 10*3/uL (ref 150–400)
RBC: 5.05 MIL/uL (ref 4.22–5.81)
RDW: 14.2 % (ref 11.5–15.5)
WBC: 18.4 10*3/uL — ABNORMAL HIGH (ref 4.0–10.5)
nRBC: 0 % (ref 0.0–0.2)

## 2020-03-20 LAB — RESP PANEL BY RT-PCR (FLU A&B, COVID) ARPGX2
Influenza A by PCR: NEGATIVE
Influenza B by PCR: NEGATIVE
SARS Coronavirus 2 by RT PCR: NEGATIVE

## 2020-03-20 LAB — COMPREHENSIVE METABOLIC PANEL
ALT: 25 U/L (ref 0–44)
AST: 16 U/L (ref 15–41)
Albumin: 4.3 g/dL (ref 3.5–5.0)
Alkaline Phosphatase: 65 U/L (ref 38–126)
Anion gap: 11 (ref 5–15)
BUN: 27 mg/dL — ABNORMAL HIGH (ref 8–23)
CO2: 23 mmol/L (ref 22–32)
Calcium: 9.1 mg/dL (ref 8.9–10.3)
Chloride: 105 mmol/L (ref 98–111)
Creatinine, Ser: 1.54 mg/dL — ABNORMAL HIGH (ref 0.61–1.24)
GFR, Estimated: 49 mL/min — ABNORMAL LOW (ref >60)
Glucose, Bld: 145 mg/dL — ABNORMAL HIGH (ref 70–99)
Potassium: 4.2 mmol/L (ref 3.5–5.1)
Sodium: 139 mmol/L (ref 135–145)
Total Bilirubin: 1.4 mg/dL — ABNORMAL HIGH (ref 0.3–1.2)
Total Protein: 7.6 g/dL (ref 6.5–8.1)

## 2020-03-20 LAB — APTT
aPTT: 36 seconds (ref 24–36)
aPTT: 48 seconds — ABNORMAL HIGH (ref 24–36)

## 2020-03-20 LAB — PROTIME-INR
INR: 1.2 (ref 0.8–1.2)
Prothrombin Time: 14.5 seconds (ref 11.4–15.2)

## 2020-03-20 LAB — HEPARIN LEVEL (UNFRACTIONATED): Heparin Unfractionated: 1.84 IU/mL — ABNORMAL HIGH (ref 0.30–0.70)

## 2020-03-20 LAB — LIPASE, BLOOD: Lipase: 37 U/L (ref 11–51)

## 2020-03-20 LAB — MRSA PCR SCREENING: MRSA by PCR: NEGATIVE

## 2020-03-20 MED ORDER — LACTATED RINGERS IV SOLN: INTRAVENOUS | Status: AC

## 2020-03-20 MED ORDER — HEPARIN (PORCINE) 25000 UT/250ML-% IV SOLN
1100.0000 [IU]/h | INTRAVENOUS | Status: DC
  Administered 2020-03-20: 1100 [IU]/h via INTRAVENOUS
  Filled 2020-03-20: qty 250

## 2020-03-20 MED ORDER — HYDROMORPHONE HCL 1 MG/ML IJ SOLN
1.0000 mg | Freq: Once | INTRAMUSCULAR | Status: AC
  Administered 2020-03-20: 1 mg via INTRAVENOUS
  Filled 2020-03-20: qty 1

## 2020-03-20 MED ORDER — METRONIDAZOLE IN NACL 5-0.79 MG/ML-% IV SOLN
500.0000 mg | Freq: Three times a day (TID) | INTRAVENOUS | Status: DC
  Administered 2020-03-20 – 2020-04-02 (×39): 500 mg via INTRAVENOUS
  Filled 2020-03-20 (×39): qty 100

## 2020-03-20 MED ORDER — ONDANSETRON HCL 4 MG/2ML IJ SOLN
4.0000 mg | Freq: Once | INTRAMUSCULAR | Status: AC
  Administered 2020-03-20: 4 mg via INTRAVENOUS
  Filled 2020-03-20: qty 2

## 2020-03-20 MED ORDER — CHLORHEXIDINE GLUCONATE CLOTH 2 % EX PADS
6.0000 | MEDICATED_PAD | Freq: Every day | CUTANEOUS | Status: DC
  Administered 2020-03-20 – 2020-03-22 (×3): 6 via TOPICAL

## 2020-03-20 MED ORDER — METOPROLOL TARTRATE 5 MG/5ML IV SOLN
5.0000 mg | Freq: Four times a day (QID) | INTRAVENOUS | Status: DC
  Administered 2020-03-20: 5 mg via INTRAVENOUS
  Filled 2020-03-20: qty 5

## 2020-03-20 MED ORDER — HEPARIN (PORCINE) 25000 UT/250ML-% IV SOLN
1400.0000 [IU]/h | INTRAVENOUS | Status: DC
  Administered 2020-03-21: 1400 [IU]/h via INTRAVENOUS
  Filled 2020-03-20: qty 250

## 2020-03-20 MED ORDER — ONDANSETRON HCL 4 MG/2ML IJ SOLN
4.0000 mg | Freq: Four times a day (QID) | INTRAMUSCULAR | Status: DC | PRN
  Administered 2020-03-22 – 2020-03-28 (×3): 4 mg via INTRAVENOUS
  Filled 2020-03-20 (×3): qty 2

## 2020-03-20 MED ORDER — IOHEXOL 300 MG/ML  SOLN
80.0000 mL | Freq: Once | INTRAMUSCULAR | Status: AC | PRN
  Administered 2020-03-20: 80 mL via INTRAVENOUS

## 2020-03-20 MED ORDER — PIPERACILLIN-TAZOBACTAM 3.375 G IVPB: 3.3750 g | Freq: Three times a day (TID) | INTRAVENOUS | Status: DC

## 2020-03-20 MED ORDER — SODIUM CHLORIDE 0.9 % IV SOLN
2.0000 g | INTRAVENOUS | Status: DC
  Administered 2020-03-20 – 2020-04-02 (×14): 2 g via INTRAVENOUS
  Filled 2020-03-20 (×3): qty 2
  Filled 2020-03-20 (×3): qty 20
  Filled 2020-03-20: qty 2
  Filled 2020-03-20 (×4): qty 20
  Filled 2020-03-20 (×2): qty 2
  Filled 2020-03-20: qty 20

## 2020-03-20 MED ORDER — ACETAMINOPHEN 650 MG RE SUPP: 650.0000 mg | Freq: Four times a day (QID) | RECTAL | Status: DC | PRN

## 2020-03-20 MED ORDER — HYDROMORPHONE HCL 1 MG/ML IJ SOLN
0.5000 mg | INTRAMUSCULAR | Status: DC | PRN
  Administered 2020-03-20 – 2020-03-21 (×6): 1 mg via INTRAVENOUS
  Filled 2020-03-20 (×6): qty 1

## 2020-03-20 MED ORDER — ACETAMINOPHEN 325 MG PO TABS
650.0000 mg | ORAL_TABLET | Freq: Four times a day (QID) | ORAL | Status: DC | PRN
Start: 2020-03-20 — End: 2020-03-23
  Administered 2020-03-21 – 2020-03-22 (×2): 650 mg via ORAL
  Filled 2020-03-20 (×2): qty 2

## 2020-03-20 MED ORDER — METOPROLOL TARTRATE 5 MG/5ML IV SOLN
5.0000 mg | INTRAVENOUS | Status: DC | PRN
  Administered 2020-03-20 – 2020-03-21 (×4): 5 mg via INTRAVENOUS
  Filled 2020-03-20 (×4): qty 5

## 2020-03-20 MED ORDER — PIPERACILLIN-TAZOBACTAM 3.375 G IVPB 30 MIN
3.3750 g | Freq: Once | INTRAVENOUS | Status: AC
  Administered 2020-03-20: 3.375 g via INTRAVENOUS
  Filled 2020-03-20: qty 50

## 2020-03-20 NOTE — Progress Notes (Addendum)
Dr. Antionette Char HPI reviewed  70 year old white male Atrial fibrillation CHA2DS2-VASc Score = 2 (Eliquis) CKD 3 HTN  Last admission admit 01/21/18/2022 complicated diverticulitis with C. difficile toxin negative--went home on ceftriaxone Flagyl for abdominal abscess  Came to ED 4 days recurrent symptoms WBC (18) creatinine 1.5 (baseline 1.2) CT scanning: Pneumoperitoneum  BP (!) 142/87 (BP Location: Right Arm)   Pulse 94   Temp 98.1 F (36.7 C) (Oral)   Resp 16   Ht 5\' 8"  (1.727 m)   Wt 79.4 kg   SpO2 93%   BMI 26.61 kg/m   EOMI NCAT no focal deficit nontoxic-appearing Smiling joking does not seem to be uncomfortable CTA B no added sound no rales no rhonchi Abdomen soft but tender in left lower quadrant no distention passing flatus No lower extremity edema   General surgery input appreciated patient is medically stable.  Planning as per general surgeries  Impression-- conservative antibiotic treatment in the hope to get him better to have an outpatient elective procedure  versus procedure requiring colostomy if worsens over next several days Would use heparin GTT for now given active infection  Does not need stepdown level status-we will transfer to medical floor if beds are needed   , MD [facility] 6:56 AM

## 2020-03-20 NOTE — H&P (Signed)
History and Physical    Daniel Aguirre:[id number] DOB: 02-12-1950 DOA: 03/19/2020  PCP: Daisy Floro, MD   Patient coming from: Home   Chief Complaint: Abdominal pain   HPI: Daniel Aguirre is a 70 y.o. male with medical history significant for hypertension, mild renal insufficiency, atrial fibrillation on Eliquis, and admission in early January for diverticulitis with abscess who now presents to the emergency department for evaluation of severe abdominal pain.  Patient was managed conservatively 2 months ago for diverticulitis with abscess that was not amenable to IR drainage, followed up with surgery, was planning for possible sigmoid colectomy, was felt to be acceptable risk without any further cardiovascular testing per cardiology, but then developed severe abdominal pain yesterday.  Patient reports that he had some abdominal discomfort yesterday at work but was able to go about his usual activities.  Yesterday night, pain worsened acutely and became much more severe than the pain he had with the recent diverticulitis.  Pain was mainly in the upper abdomen and sharp in character.  ED Course: Upon arrival to the ED, patient is found to be afebrile, saturating well on room air, and with stable blood pressure.  Chemistry panel is notable for creatinine 1.54 and CBC features a leukocytosis to 18,400.  CT the abdomen and pelvis is concerning for new pneumoperitoneum likely secondary to prior sigmoid diverticulitis though there are loops of small bowel in the low midline abdomen that demonstrated wall thickening and adjacent fat stranding.  Surgery was contacted by the emergency department and recommended medical admission.  Patient was treated with Zosyn, Dilaudid, and Zofran.  COVID-19 screening test not yet resulted.  Review of Systems:  All other systems reviewed and apart from HPI, are negative.  Past Medical History:  Diagnosis Date  . CKD (chronic kidney disease), stage III  (HCC)   . Diastolic dysfunction    a. 09/2015 Echo: EF 65-70%, no rwma, Gr1 DD, Mod AI. Asc AO 52mm. Mildly dil Asc Ao.  LA 83mm.  Marland Kitchen Dyslipidemia   . Hypertension   . Kidney stones   . Moderate aortic insufficiency    a. 09/2015 Echo: Mod AI.  Marland Kitchen Right bundle branch block   . Testicular hypofunction     Past Surgical History:  Procedure Laterality Date  . CHOLECYSTECTOMY    . MOHS SURGERY      Social History:   reports that he has never smoked. He has never used smokeless tobacco. He reports current alcohol use. He reports that he does not use drugs.  Allergies  Allergen Reactions  . Amlodipine Besylate Other (See Comments)  . Amoxicillin-Pot Clavulanate Other (See Comments)  . Amlodipine Palpitations  . Lisinopril Other (See Comments)    Elevated  creatinine     Family History  Problem Relation Age of Onset  . Colon cancer Mother 52  . AAA (abdominal aortic aneurysm) Father 66  . Stroke Father   . Heart attack Brother 72       presumed MI - SCD  . Stroke Brother 29     Prior to Admission medications   Medication Sig Start Date End Date Taking? Authorizing Provider  acetaminophen (TYLENOL) 650 MG CR tablet Take 1,300 mg by mouth in the morning, at noon, and at bedtime.    [provider]  amoxicillin-clavulanate (AUGMENTIN) 875-125 MG tablet Take 1 tablet by mouth 2 (two) times daily. 02/17/20   Vu, Gershon Mussel T, MD  Cholecalciferol (VITAMIN D3) 125 MCG (5000 UT) CAPS Take  1 capsule by mouth daily. 04/21/19   [provider]  diltiazem (CARDIZEM CD) 240 MG 24 hr capsule Take 1 capsule (240 mg total) by mouth daily. 09/29/19   Rollene Rotunda, MD  ELIQUIS 5 MG TABS tablet TAKE 1 TABLET BY MOUTH TWICE A DAY 04/12/18   Rollene Rotunda, MD  metoprolol tartrate (LOPRESSOR) 25 MG tablet Take 25 mg by mouth 2 (two) times daily.    [provider]  tamsulosin (FLOMAX) 0.4 MG CAPS capsule Take 0.4 mg by mouth daily. 10/04/19   [provider]   testosterone (ANDROGEL) 50 MG/5GM (1%) GEL APPLY 1 TUBE ONCE A DAY AS DIRECTED 04/08/18   [provider]    Physical Exam: Vitals:   03/20/20 0300 03/20/20 0330 03/20/20 0400 03/20/20 0430  BP: (!) 145/84 (!) 158/91 128/77 135/90  Pulse: 83 95 91 93  Resp: 16 16 16 16   Temp:      TempSrc:      SpO2: 93% 96% 92% 90%  Weight:      Height:        Constitutional: NAD, calm  Eyes: PERTLA, lids and conjunctivae normal ENMT: Mucous membranes are moist. Posterior pharynx clear of any exudate or lesions.   Neck: normal, supple, no masses, no thyromegaly Respiratory: no wheezing, no crackles. No accessory muscle use.  Cardiovascular: S1 & S2 heard, regular rate and rhythm. No extremity edema.   Abdomen: Guarding, tender in upper > lower abd. Bowel sounds active.  Musculoskeletal: no clubbing / cyanosis. No joint deformity upper and lower extremities.   Skin: Erythema and urticaria involving abdomen and chest. Warrm, dry, well-perfused. Neurologic: CN 2-12 grossly intact. Sensation intact. Moving all extremities.  Psychiatric: Alert and oriented to person, place, and situation. Pleasant and cooperative.    Labs and Imaging on Admission: I have personally reviewed following labs and imaging studies  CBC: Recent Labs  Lab 03/20/20 0014  WBC 18.4*  NEUTROABS 15.4*  HGB 15.7  HCT 45.9  MCV 90.9  PLT 253   Basic Metabolic Panel: Recent Labs  Lab 03/20/20 0014  NA 139  K 4.2  CL 105  CO2 23  GLUCOSE 145*  BUN 27*  CREATININE 1.54*  CALCIUM 9.1   GFR: Estimated Creatinine Clearance: 45 mL/min (A) (by C-G formula based on SCr of 1.54 mg/dL (H)). Liver Function Tests: Recent Labs  Lab 03/20/20 0014  AST 16  ALT 25  ALKPHOS 65  BILITOT 1.4*  PROT 7.6  ALBUMIN 4.3   Recent Labs  Lab 03/20/20 0014  LIPASE 37   No results for input(s): AMMONIA in the last 168 hours. Coagulation Profile: No results for input(s): INR, PROTIME in the last 168 hours. Cardiac  Enzymes: No results for input(s): CKTOTAL, CKMB, CKMBINDEX, TROPONINI in the last 168 hours. BNP (last 3 results) No results for input(s): PROBNP in the last 8760 hours. HbA1C: No results for input(s): HGBA1C in the last 72 hours. CBG: No results for input(s): GLUCAP in the last 168 hours. Lipid Profile: No results for input(s): CHOL, HDL, LDLCALC, TRIG, CHOLHDL, LDLDIRECT in the last 72 hours. Thyroid Function Tests: No results for input(s): TSH, T4TOTAL, FREET4, T3FREE, THYROIDAB in the last 72 hours. Anemia Panel: No results for input(s): VITAMINB12, FOLATE, FERRITIN, TIBC, IRON, RETICCTPCT in the last 72 hours. Urine analysis:    Component Value Date/Time   COLORURINE YELLOW 03/20/2020 0330   APPEARANCEUR CLEAR 03/20/2020 0330   LABSPEC 1.035 (H) 03/20/2020 0330   PHURINE 5.0 03/20/2020 0330  GLUCOSEU NEGATIVE 03/20/2020 0330   HGBUR NEGATIVE 03/20/2020 0330   BILIRUBINUR NEGATIVE 03/20/2020 0330   KETONESUR NEGATIVE 03/20/2020 0330   PROTEINUR NEGATIVE 03/20/2020 0330   NITRITE NEGATIVE 03/20/2020 0330   LEUKOCYTESUR NEGATIVE 03/20/2020 0330   Sepsis Labs: @LABRCNTIP (procalcitonin:4,lacticidven:4) )No results found for this or any previous visit (from the past 240 hour(s)).   Radiological Exams on Admission: CT ABDOMEN PELVIS W CONTRAST  Result Date: 03/20/2020 CLINICAL DATA:  Abdominal pain EXAM: CT ABDOMEN AND PELVIS WITH CONTRAST TECHNIQUE: Multidetector CT imaging of the abdomen and pelvis was performed using the standard protocol following bolus administration of intravenous contrast. CONTRAST:  80mL OMNIPAQUE IOHEXOL 300 MG/ML  SOLN COMPARISON:  February 20, 2020 FINDINGS: Lower chest: The lung bases are clear. The heart size is normal. Hepatobiliary: The liver is normal. Status post cholecystectomy.There is no biliary ductal dilation. Pancreas: Normal contours without ductal dilatation. No peripancreatic fluid collection. Spleen: Unremarkable. Adrenals/Urinary Tract:  --Adrenal glands: Unremarkable. --Right kidney/ureter: No hydronephrosis or radiopaque kidney stones. --Left kidney/ureter: No hydronephrosis or radiopaque kidney stones. --Urinary bladder: Unremarkable. Stomach/Bowel: --Stomach/Duodenum: No hiatal hernia or other gastric abnormality. Normal duodenal course and caliber. --Small bowel: There is diffuse circumferential wall thickening of multiple small bowel loops in the low midline abdomen. There are adjacent pockets of gas and free fluid. There is associated wall thickening of the nearby small bowel. There is adjacent fat stranding. --Colon: Again noted are colonic diverticula following the sigmoid colon. There is an apparent fistulous tract coursing superiorly from the sigmoid colon towards pockets of gas and fluid in the mid abdomen. --Appendix: Normal. Vascular/Lymphatic: Normal course and caliber of the major abdominal vessels. --No retroperitoneal lymphadenopathy. --No mesenteric lymphadenopathy. --No pelvic or inguinal lymphadenopathy. Reproductive: Unremarkable Other: There is new free air scattered throughout the abdomen. The abdominal wall is normal. Musculoskeletal. No acute displaced fractures. IMPRESSION: New pneumoperitoneum. This is felt to be secondary to the previously demonstrated sigmoid diverticulitis. However, there are loops of small bowel in the low midline abdomen that demonstrate wall thickening and hyperenhancement. While these are felt to be reactive, they may also be the source of free air. These results will be called to the ordering clinician or representative by the Radiologist Assistant, and communication documented in the PACS or [employer] Dash[employer]. Electronically Signed   By: Katherine Mantlehristopher  Green M.D.   On: 03/20/2020 03:53    Assessment/Plan  1. Bowel perforation  - Patient with admission for sigmoid diverticulitis with abscess in January now returns with severe abdominal pain and is found to have new pneumoperitoneum  - Surgery  consulting and much appreciated  - Treated with Zosyn in ED; he had a rash recently on Augmentin and pharmacy advises Rocephin and Flagyl instead  - Continue antibiotics, NPO, pain-control   2. Atrial fibrillation  - In sinus rhythm on admission  - Hold Eliquis pending surgical consultation, last dose was ~18:00 on 2/28  - Use IV Lopressor if needed for rate control    3. CKD II  - SCr is 1.54 on admission, up from apparent baseline closer to 1.3  - Renally-dose medications, monitor    4. Hypertension  - BP at goal, treat as-needed only for now    DVT prophylaxis: SCDs; Eliquis pta, last dose ~18:00 on 2/28  Code Status: Full  Level of Care: Level of care: Stepdown Family Communication: None present  Disposition Plan:  Patient is from: Home  Anticipated d/c is to: TBD Anticipated d/c date is: 03/25/20 Patient currently: Pending surgical consultation  Consults called: Surgery  Admission status: Inpatient     Briscoe Deutscher, MD [facility]  03/20/2020, 6:24 AM

## 2020-03-20 NOTE — ED Provider Notes (Signed)
[facility]-EMERGENCY DEPT Provider Note   CSN: [id number] Arrival date & time: 03/19/20  2347     History Chief Complaint  Patient presents with   Abdominal Pain    Daniel Aguirre is a 70 y.o. male.  The history is provided by the patient and medical records.  Abdominal Pain Associated symptoms: nausea     70 year old male with history of stage III chronic kidney disease, dyslipidemia, hypertension, presenting to the ED with abdominal pain.  Patient was hospitalized in January for diverticulitis with perforated small abscess.  States he completed several weeks of Augmentin and has been doing well.  States he even had a follow-up CT scan that was normal.  States over the past few days has been having issues with intermittent rash, had a telemetry visit for this was prescribed some medication (prednisone and antihistamines) which seems to be helping.  He was restarted on Augmentin about a week ago due to recurrent pain.  Pain worsened this evening after trying to eat dinner, states abdomen just feels "tight".  States he went to lay down for bed early and woke up in excruciating pain.  States he feels like his abdomen is very bloated.  States he has not had any difficulty urinating, and had a bowel movement around 6 PM this evening.  He has had some nausea without vomiting.  Past Medical History:  Diagnosis Date   CKD (chronic kidney disease), stage III (HCC)    Diastolic dysfunction    a. 09/2015 Echo: EF 65-70%, no rwma, Gr1 DD, Mod AI. Asc AO 37mm. Mildly dil Asc Ao.  LA 60mm.   Dyslipidemia    Hypertension    Kidney stones    Moderate aortic insufficiency    a. 09/2015 Echo: Mod AI.   Right bundle branch block    Testicular hypofunction     Patient Active Problem List   Diagnosis Date Noted   Diarrhea    Diverticulitis 01/23/2020   Diverticulitis of large intestine with perforation and abscess without bleeding 01/23/2020   Intra-abdominal  abscess (HCC) 01/23/2020   Educated about COVID-19 virus infection 09/28/2019   Chronic kidney disease (CKD), stage II (mild) 09/28/2019   Elevated troponin 09/28/2019   Nonrheumatic aortic valve insufficiency 01/07/2018   Abnormal EKG 01/07/2018   Chest pressure    A-fib (HCC) 12/20/2017   HTN (hypertension) 12/20/2017   HLD (hyperlipidemia) 12/20/2017   CKD (chronic kidney disease) stage 3, GFR 30-59 ml/min (HCC) 12/20/2017    Past Surgical History:  Procedure Laterality Date   CHOLECYSTECTOMY     MOHS SURGERY         Family History  Problem Relation Age of Onset   Colon cancer Mother 88   AAA (abdominal aortic aneurysm) Father 98   Stroke Father    Heart attack Brother 15       presumed MI - SCD   Stroke Brother 81    Social History   Tobacco Use   Smoking status: Never Smoker   Smokeless tobacco: Never Used  Building services engineer Use: Never used  Substance Use Topics   Alcohol use: Yes   Drug use: Never    Home Medications Prior to Admission medications   Medication Sig Start Date End Date Taking? Authorizing Provider  acetaminophen (TYLENOL) 650 MG CR tablet Take 1,300 mg by mouth in the morning, at noon, and at bedtime.    [provider]  amoxicillin-clavulanate (AUGMENTIN) 875-125 MG tablet Take 1 tablet  by mouth 2 (two) times daily. 02/17/20   Vu, Gershon Mussel T, MD  Cholecalciferol (VITAMIN D3) 125 MCG (5000 UT) CAPS Take 1 capsule by mouth daily. 04/21/19   [provider]  diltiazem (CARDIZEM CD) 240 MG 24 hr capsule Take 1 capsule (240 mg total) by mouth daily. 09/29/19   Rollene Rotunda, MD  ELIQUIS 5 MG TABS tablet TAKE 1 TABLET BY MOUTH TWICE A DAY 04/12/18   Rollene Rotunda, MD  metoprolol tartrate (LOPRESSOR) 25 MG tablet Take 25 mg by mouth 2 (two) times daily.    [provider]  tamsulosin (FLOMAX) 0.4 MG CAPS capsule Take 0.4 mg by mouth daily. 10/04/19   [provider]  testosterone (ANDROGEL) 50  MG/5GM (1%) GEL APPLY 1 TUBE ONCE A DAY AS DIRECTED 04/08/18   [provider]    Allergies    Amlodipine besylate, Amoxicillin-pot clavulanate, Amlodipine, and Lisinopril  Review of Systems   Review of Systems  Gastrointestinal: Positive for abdominal pain and nausea.  All other systems reviewed and are negative.   Physical Exam Updated Vital Signs BP (!) 160/96 (BP Location: Right Arm)    Pulse 70    Temp 98.1 F (36.7 C) (Oral)    Resp 18    Ht 5\' 8"  (1.727 m)    Wt 81.6 kg    SpO2 97%    BMI 27.37 kg/m   Physical Exam Vitals and nursing note reviewed.  Constitutional:      Appearance: He is well-developed and well-nourished.  HENT:     Head: Normocephalic and atraumatic.     Mouth/Throat:     Mouth: Oropharynx is clear and moist.  Eyes:     Extraocular Movements: EOM normal.     Conjunctiva/sclera: Conjunctivae normal.     Pupils: Pupils are equal, round, and reactive to light.  Cardiovascular:     Rate and Rhythm: Normal rate and regular rhythm.     Heart sounds: Normal heart sounds.  Pulmonary:     Effort: Pulmonary effort is normal.     Breath sounds: Normal breath sounds.  Abdominal:     General: Bowel sounds are decreased. There is distension.     Palpations: Abdomen is soft.     Tenderness: There is generalized abdominal tenderness.  Genitourinary:    Comments: Exam chaperoned by NT Non-specific rash noted in the groin, blanches with palpation, rash is not raised/weeping/draining, no urticaria or vesicles noted Musculoskeletal:        General: Normal range of motion.     Cervical back: Normal range of motion.  Skin:    General: Skin is warm and dry.  Neurological:     Mental Status: He is alert and oriented to person, place, and time.  Psychiatric:        Mood and Affect: Mood and affect normal.     ED Results / Procedures / Treatments   Labs (all labs ordered are listed, but only abnormal results are displayed) Labs Reviewed  CBC WITH  DIFFERENTIAL/PLATELET - Abnormal; Notable for the following components:      Result Value   WBC 18.4 (*)    Neutro Abs 15.4 (*)    Monocytes Absolute 1.3 (*)    Abs Immature Granulocytes 0.13 (*)    All other components within normal limits  COMPREHENSIVE METABOLIC PANEL - Abnormal; Notable for the following components:   Glucose, Bld 145 (*)    BUN 27 (*)    Creatinine, Ser 1.54 (*)  Total Bilirubin 1.4 (*)    GFR, Estimated 49 (*)    All other components within normal limits  URINALYSIS, ROUTINE W REFLEX MICROSCOPIC - Abnormal; Notable for the following components:   Specific Gravity, Urine 1.035 (*)    All other components within normal limits  RESP PANEL BY RT-PCR (FLU A&B, COVID) ARPGX2  LIPASE, BLOOD    EKG None  Radiology CT ABDOMEN PELVIS W CONTRAST  Result Date: 03/20/2020 CLINICAL DATA:  Abdominal pain EXAM: CT ABDOMEN AND PELVIS WITH CONTRAST TECHNIQUE: Multidetector CT imaging of the abdomen and pelvis was performed using the standard protocol following bolus administration of intravenous contrast. CONTRAST:  66mL OMNIPAQUE IOHEXOL 300 MG/ML  SOLN COMPARISON:  February 20, 2020 FINDINGS: Lower chest: The lung bases are clear. The heart size is normal. Hepatobiliary: The liver is normal. Status post cholecystectomy.There is no biliary ductal dilation. Pancreas: Normal contours without ductal dilatation. No peripancreatic fluid collection. Spleen: Unremarkable. Adrenals/Urinary Tract: --Adrenal glands: Unremarkable. --Right kidney/ureter: No hydronephrosis or radiopaque kidney stones. --Left kidney/ureter: No hydronephrosis or radiopaque kidney stones. --Urinary bladder: Unremarkable. Stomach/Bowel: --Stomach/Duodenum: No hiatal hernia or other gastric abnormality. Normal duodenal course and caliber. --Small bowel: There is diffuse circumferential wall thickening of multiple small bowel loops in the low midline abdomen. There are adjacent pockets of gas and free fluid. There is  associated wall thickening of the nearby small bowel. There is adjacent fat stranding. --Colon: Again noted are colonic diverticula following the sigmoid colon. There is an apparent fistulous tract coursing superiorly from the sigmoid colon towards pockets of gas and fluid in the mid abdomen. --Appendix: Normal. Vascular/Lymphatic: Normal course and caliber of the major abdominal vessels. --No retroperitoneal lymphadenopathy. --No mesenteric lymphadenopathy. --No pelvic or inguinal lymphadenopathy. Reproductive: Unremarkable Other: There is new free air scattered throughout the abdomen. The abdominal wall is normal. Musculoskeletal. No acute displaced fractures. IMPRESSION: New pneumoperitoneum. This is felt to be secondary to the previously demonstrated sigmoid diverticulitis. However, there are loops of small bowel in the low midline abdomen that demonstrate wall thickening and hyperenhancement. While these are felt to be reactive, they may also be the source of free air. These results will be called to the ordering clinician or representative by the Radiologist Assistant, and communication documented in the PACS or [employer]. Electronically Signed   By: Katherine Mantle M.D.   On: 03/20/2020 03:53    Procedures Procedures   CRITICAL CARE Performed by: Garlon Hatchet   Total critical care time: 45 minutes  Critical care time was exclusive of separately billable procedures and treating other patients.  Critical care was necessary to treat or prevent imminent or life-threatening deterioration.  Critical care was time spent personally by me on the following activities: development of treatment plan with patient and/or surrogate as well as nursing, discussions with consultants, evaluation of patient's response to treatment, examination of patient, obtaining history from patient or surrogate, ordering and performing treatments and interventions, ordering and review of laboratory studies,  ordering and review of radiographic studies, pulse oximetry and re-evaluation of patient's condition.   Medications Ordered in ED Medications  piperacillin-tazobactam (ZOSYN) IVPB 3.375 g (3.375 g Intravenous New Bag/Given 03/20/20 0539)  HYDROmorphone (DILAUDID) injection 0.5-1 mg (has no administration in time range)  ondansetron (ZOFRAN) injection 4 mg (has no administration in time range)  HYDROmorphone (DILAUDID) injection 1 mg (1 mg Intravenous Given 03/20/20 0014)  ondansetron (ZOFRAN) injection 4 mg (4 mg Intravenous Given 03/20/20 0013)  iohexol (OMNIPAQUE) 300 MG/ML solution 80  mL (80 mLs Intravenous Contrast Given 03/20/20 0305)  HYDROmorphone (DILAUDID) injection 1 mg (1 mg Intravenous Given 03/20/20 0536)    ED Course  I have reviewed the triage vital signs and the nursing notes.  Pertinent labs & imaging results that were available during my care of the patient were reviewed by me and considered in my medical decision making (see chart for details).    MDM Rules/Calculators/A&P  70 y.o. M here with abdominal pain.  Recent admission for diverticulitis with abscess, discharged home on 3 weeks of Augmentin and did well with this.  Had follow-up CT at the end of January which showed resolution of abscess.  Was re-started on Augmentin about a week ago due to some mild pain, worsened today after trying to eat dinner last evening. Has had some nausea but denies vomiting.  States he feels "bloated".  He is afebrile and nontoxic, does appear uncomfortable.  Generalized tenderness on exam with some distention present.  Vitals are stable.  Will obtain labs and CT.  Labs with leukocytosis, however given he has been on prednisone this may be contributing.  CT with new pneumoperitoneum, possibly due to previous sigmoid diverticulitis but several loops of bowel with wall thickening and hyper enhancement which may be source of free air as well.  Patient did have follow-up CT after his diverticulitis with  abscess on 02/20/20 without free air so this appears new.  Discussed with Dr. Andrey CampanileWilson-- suspicion this may be smoldering diverticulitis with new perforation. Recommends re-start zosyn since he did well with augmentin.  He will consult, requested medical admission.    Plan discussed with patient and wife, they are agreeable.  He is much more comfortable at this time and has been resting but pain started to recur following repeat abdominal exam.  Additional medications ordered.  Discussed with Dr. Antionette Charpyd--- will admit for ongoing care.  Final Clinical Impression(s) / ED Diagnoses Final diagnoses:  Pneumoperitoneum    Rx / DC Orders ED Discharge Orders    None       Garlon HatchetSanders, Talesha Ellithorpe M, PA-C 03/20/20 0545    Palumbo, April, MD 03/20/20 [phone]

## 2020-03-20 NOTE — Consult Note (Signed)
[facility] Surgery Consult Note  Daniel Aguirre 02/02/1950  [id number].    Requesting MD: Odie Sera Chief Complaint: Stabbing abdominal pain worse than original diverticulitis Reason for Consult: recurrent diverticulitis with perforation  HPI:  Patient is a 70 year old male who was admitted on 01/23/2020, with left lower quadrant cramping and some diarrhea.  He had similar attacks just after Thanksgiving kept him out of work for 2 days thought it was a GI bug.  Work-up at that time showed he had diverticulitis with an abscess that was more centrally located in the small bowel mesentery.  He was treated with IV antibiotics.  The abscess was not amenable to drainage.  He was discharged home on oral antibiotics for total of 14 days of antibiotics.  He was seen by Dr. Renold Don ID and given 2 additional courses of antibiotics for C. difficile.  He completed the first course but was told to discontinue the second course after a few days.  He was seen in our office by Dr. Cliffton Asters and was being set up for colonoscopy prior to elective sigmoid colectomy.  Yesterday he called and reported sharp abdominal pain is upper abdomen around February 18 he resumed taking "leftover Augmentin."  He took it for about 1 week and developed a rash which was treated by an employee health physician with steroids.  He developed additional pain but it became severe and he called our office was subsequently sent to the ED.  Work-up in the ED shows he is afebrile blood pressure is 160/96, CMP shows glucose of 145, BUN of 27, creatinine 1.54, total bilirubin 1.4, WBC 18.4 H/H 15.7/45.9, platelets 253,000.  Covid is negative.  Urinalysis is unremarkable.  CT scan shows some new pneumoperitoneum thought to be secondary to previously demonstrated diverticulitis.  There are loops of small bowel in the low midline abdomen that demonstrate wall thickening and hyperenhancement felt to be reactive.  Colonic diverticuli following the  sigmoid colon there is an apparent fistulous tract coursing superiorly from the sigmoid: Towards pockets of gas and fluid in the mid abdomen.  Patient has multiple medical problems including, hypertension, mild renal insufficiency, atrial fibrillation on Eliquis.  He was admitted by medicine we are asked to see.  His last dose of Eliquis was around 6 PM, 03/19/2020.  ROS: Review of Systems  Constitutional: Positive for chills and fever (99 at home).  HENT: Negative.   Eyes: Negative.   Respiratory: Negative.        He has had both Covid vaccinations and booster.  Cardiovascular: Positive for palpitations (He has atrial fibrillation is on Eliquis.). Negative for chest pain, orthopnea, claudication, leg swelling and PND.  Gastrointestinal: Positive for abdominal pain and heartburn. Negative for blood in stool, constipation, diarrhea, melena, nausea and vomiting.  Genitourinary: Negative.        On Flomax for BPH  Musculoskeletal: Negative.   Skin: Positive for rash.       He developed a rash on his chest abdomen and groin.  And was subsequently placed on prednisone by telemed physician.  Neurological: Negative.   Endo/Heme/Allergies: Bruises/bleeds easily.  Psychiatric/Behavioral: Negative.     Family History  Problem Relation Age of Onset  . Colon cancer Mother 78  . AAA (abdominal aortic aneurysm) Father 52  . Stroke Father   . Heart attack Brother 72       presumed MI - SCD  . Stroke Brother 22    Past Medical History:  Diagnosis Date  . CKD (chronic  kidney disease), stage III (HCC)   . Diastolic dysfunction    a. 09/2015 Echo: EF 65-70%, no rwma, Gr1 DD, Mod AI. Asc AO 66mm. Mildly dil Asc Ao.  LA 70mm.  Marland Kitchen Dyslipidemia   . Hypertension   . Kidney stones   . Moderate aortic insufficiency    a. 09/2015 Echo: Mod AI.  Marland Kitchen Right bundle branch block   . Testicular hypofunction     Past Surgical History:  Procedure Laterality Date  . CHOLECYSTECTOMY    . MOHS SURGERY       Social History:  reports that he has never smoked. He has never used smokeless tobacco. He reports current alcohol use. He reports that he does not use drugs.  Allergies:  Allergies  Allergen Reactions  . Amlodipine Besylate Other (See Comments)  . Amoxicillin-Pot Clavulanate Other (See Comments)  . Amlodipine Palpitations  . Lisinopril Other (See Comments)    Elevated  creatinine     Prior to Admission medications   Medication Sig Start Date End Date Taking? Authorizing Provider  acetaminophen (TYLENOL) 650 MG CR tablet Take 1,300 mg by mouth in the morning, at noon, and at bedtime.    [provider]  amoxicillin-clavulanate (AUGMENTIN) 875-125 MG tablet Take 1 tablet by mouth 2 (two) times daily. 02/17/20   Vu, Gershon Mussel T, MD  Cholecalciferol (VITAMIN D3) 125 MCG (5000 UT) CAPS Take 1 capsule by mouth daily. 04/21/19   [provider]  diltiazem (CARDIZEM CD) 240 MG 24 hr capsule Take 1 capsule (240 mg total) by mouth daily. 09/29/19   Rollene Rotunda, MD  ELIQUIS 5 MG TABS tablet TAKE 1 TABLET BY MOUTH TWICE A DAY 04/12/18   Rollene Rotunda, MD  metoprolol tartrate (LOPRESSOR) 25 MG tablet Take 25 mg by mouth 2 (two) times daily.    [provider]  tamsulosin (FLOMAX) 0.4 MG CAPS capsule Take 0.4 mg by mouth daily. 10/04/19   [provider]  testosterone (ANDROGEL) 50 MG/5GM (1%) GEL APPLY 1 TUBE ONCE A DAY AS DIRECTED 04/08/18   [provider]     Blood pressure (!) 154/85, pulse 89, temperature 98.1 F (36.7 C), temperature source Oral, resp. rate 18, height 5\' 8"  (1.727 m), weight 81.6 kg, SpO2 93 %. Physical Exam:  General: pleasant, WD, WN white male who is laying in bed in NAD HEENT: head is normocephalic, atraumatic.  Sclera are noninjected.  Pupils are equal.  Ears and nose without any masses or lesions.  Mouth is pink and moist Heart: regular, rate, and rhythm.  Normal s1,s2. No obvious murmurs, gallops, or rubs noted.  Palpable  radial and pedal pulses bilaterally Lungs: CTAB, no wheezes, rhonchi, or rales noted.  Respiratory effort nonlabored Abd: soft, pain is always been in the midline mostly above the umbilicus but some below the umbilicus.  Bowel sounds are hypoactive.  No diarrhea or constipation.  No blood in his stool. MS: all 4 extremities are symmetrical with no cyanosis, clubbing, or edema. Skin: He has a rash that starts at his chest goes down over his abdomen.  The rash is a deep reddish-purple in color on his chest.  He becomes a little less dense and more red over his abdomen.  He has some rash in his groin a raised patchy rash. Neuro: Cranial nerves 2-12 grossly intact, sensation is normal throughout Psych: A&Ox3 with an appropriate affect.   Results for orders placed or performed during the hospital encounter of 03/19/20 (from the past 48 hour(s))  CBC with Differential     Status: Abnormal   Collection Time: 03/20/20 12:14 AM  Result Value Ref Range   WBC 18.4 (H) 4.0 - 10.5 K/uL   RBC 5.05 4.22 - 5.81 MIL/uL   Hemoglobin 15.7 13.0 - 17.0 g/dL   HCT 16.1 09.6 - 04.5 %   MCV 90.9 80.0 - 100.0 fL   MCH 31.1 26.0 - 34.0 pg   MCHC 34.2 30.0 - 36.0 g/dL   RDW 40.9 81.1 - 91.4 %   Platelets 253 150 - 400 K/uL   nRBC 0.0 0.0 - 0.2 %   Neutrophils Relative % 84 %   Neutro Abs 15.4 (H) 1.7 - 7.7 K/uL   Lymphocytes Relative 8 %   Lymphs Abs 1.5 0.7 - 4.0 K/uL   Monocytes Relative 7 %   Monocytes Absolute 1.3 (H) 0.1 - 1.0 K/uL   Eosinophils Relative 0 %   Eosinophils Absolute 0.0 0.0 - 0.5 K/uL   Basophils Relative 0 %   Basophils Absolute 0.0 0.0 - 0.1 K/uL   Immature Granulocytes 1 %   Abs Immature Granulocytes 0.13 (H) 0.00 - 0.07 K/uL    Comment: Performed at [facility], [address] [place], [place] [address]  Comprehensive metabolic panel     Status: Abnormal   Collection Time: 03/20/20 12:14 AM  Result Value Ref Range   Sodium 139 135 - 145 mmol/L   Potassium 4.2  3.5 - 5.1 mmol/L   Chloride 105 98 - 111 mmol/L   CO2 23 22 - 32 mmol/L   Glucose, Bld 145 (H) 70 - 99 mg/dL    Comment: Glucose reference range applies only to samples taken after fasting for at least 8 hours.   BUN 27 (H) 8 - 23 mg/dL   Creatinine, Ser 6.21 (H) 0.61 - 1.24 mg/dL   Calcium 9.1 8.9 - 30.8 mg/dL   Total Protein 7.6 6.5 - 8.1 g/dL   Albumin 4.3 3.5 - 5.0 g/dL   AST 16 15 - 41 U/L   ALT 25 0 - 44 U/L   Alkaline Phosphatase 65 38 - 126 U/L   Total Bilirubin 1.4 (H) 0.3 - 1.2 mg/dL   GFR, Estimated 49 (L) >60 mL/min    Comment: (NOTE) Calculated using the CKD-EPI Creatinine Equation (2021)    Anion gap 11 5 - 15    Comment: Performed at [facility], [address], [place], [place] [address]  Lipase, blood     Status: None   Collection Time: 03/20/20 12:14 AM  Result Value Ref Range   Lipase 37 11 - 51 U/L    Comment: Performed at [facility], [address] [place], [place] [address]  Urinalysis, Routine w reflex microscopic Urine, Clean Catch     Status: Abnormal   Collection Time: 03/20/20  3:30 AM  Result Value Ref Range   Color, Urine YELLOW YELLOW   APPearance CLEAR CLEAR   Specific Gravity, Urine 1.035 (H) 1.005 - 1.030   pH 5.0 5.0 - 8.0   Glucose, UA NEGATIVE NEGATIVE mg/dL   Hgb urine dipstick NEGATIVE NEGATIVE   Bilirubin Urine NEGATIVE NEGATIVE   Ketones, ur NEGATIVE NEGATIVE mg/dL   Protein, ur NEGATIVE NEGATIVE mg/dL   Nitrite NEGATIVE NEGATIVE   Leukocytes,Ua NEGATIVE NEGATIVE   RBC / HPF 0-5 0 - 5 RBC/hpf   WBC, UA 0-5 0 - 5 WBC/hpf   Bacteria, UA NONE SEEN NONE SEEN   Mucus PRESENT  Comment: Performed at [facility], [address] [place], [place] [address]  Resp Panel by RT-PCR (Flu A&B, Covid) Nasopharyngeal Swab     Status: None   Collection Time: 03/20/20  5:42 AM   Specimen: Nasopharyngeal Swab; Nasopharyngeal(NP) swabs in vial transport medium  Result Value Ref Range    SARS Coronavirus 2 by RT PCR NEGATIVE NEGATIVE    Comment: (NOTE) SARS-CoV-2 target nucleic acids are NOT DETECTED.  The SARS-CoV-2 RNA is generally detectable in upper respiratory specimens during the acute phase of infection. The lowest concentration of SARS-CoV-2 viral copies this assay can detect is 138 copies/mL. A negative result does not preclude SARS-Cov-2 infection and should not be used as the sole basis for treatment or other patient management decisions. A negative result may occur with  improper specimen collection/handling, submission of specimen other than nasopharyngeal swab, presence of viral mutation(s) within the areas targeted by this assay, and inadequate number of viral copies(<138 copies/mL). A negative result must be combined with clinical observations, patient history, and epidemiological information. The expected result is Negative.  Fact Sheet for Patients:  BloggerCourse.comhttps://www.fda.gov/media/152166/download  Fact Sheet for Healthcare Providers:  SeriousBroker.ithttps://www.fda.gov/media/152162/download  This test is no t yet approved or cleared by the [place] FDA and  has been authorized for detection and/or diagnosis of SARS-CoV-2 by FDA under an Emergency Use Authorization (EUA). This EUA will remain  in effect (meaning this test can be used) for the duration of the COVID-19 declaration under Section 564(b)(1) of the Act, 21 U.S.C.section 360bbb-3(b)(1), unless the authorization is terminated  or revoked sooner.       Influenza A by PCR NEGATIVE NEGATIVE   Influenza B by PCR NEGATIVE NEGATIVE    Comment: (NOTE) The Xpert Xpress SARS-CoV-2/FLU/RSV plus assay is intended as an aid in the diagnosis of influenza from Nasopharyngeal swab specimens and should not be used as a sole basis for treatment. Nasal washings and aspirates are unacceptable for Xpert Xpress SARS-CoV-2/FLU/RSV testing.  Fact Sheet for Patients: BloggerCourse.comhttps://www.fda.gov/media/152166/download  Fact  Sheet for Healthcare Providers: SeriousBroker.ithttps://www.fda.gov/media/152162/download  This test is not yet approved or cleared by the [place] FDA and has been authorized for detection and/or diagnosis of SARS-CoV-2 by FDA under an Emergency Use Authorization (EUA). This EUA will remain in effect (meaning this test can be used) for the duration of the COVID-19 declaration under Section 564(b)(1) of the Act, 21 U.S.C. section 360bbb-3(b)(1), unless the authorization is terminated or revoked.  Performed at [facility], [address] [place], [place] [address]    CT ABDOMEN PELVIS W CONTRAST  Result Date: 03/20/2020 CLINICAL DATA:  Abdominal pain EXAM: CT ABDOMEN AND PELVIS WITH CONTRAST TECHNIQUE: Multidetector CT imaging of the abdomen and pelvis was performed using the standard protocol following bolus administration of intravenous contrast. CONTRAST:  80mL OMNIPAQUE IOHEXOL 300 MG/ML  SOLN COMPARISON:  February 20, 2020 FINDINGS: Lower chest: The lung bases are clear. The heart size is normal. Hepatobiliary: The liver is normal. Status post cholecystectomy.There is no biliary ductal dilation. Pancreas: Normal contours without ductal dilatation. No peripancreatic fluid collection. Spleen: Unremarkable. Adrenals/Urinary Tract: --Adrenal glands: Unremarkable. --Right kidney/ureter: No hydronephrosis or radiopaque kidney stones. --Left kidney/ureter: No hydronephrosis or radiopaque kidney stones. --Urinary bladder: Unremarkable. Stomach/Bowel: --Stomach/Duodenum: No hiatal hernia or other gastric abnormality. Normal duodenal course and caliber. --Small bowel: There is diffuse circumferential wall thickening of multiple small bowel loops in the low midline abdomen. There are adjacent pockets of gas and free fluid. There is  associated wall thickening of the nearby small bowel. There is adjacent fat stranding. --Colon: Again noted are colonic diverticula following the sigmoid colon. There is  an apparent fistulous tract coursing superiorly from the sigmoid colon towards pockets of gas and fluid in the mid abdomen. --Appendix: Normal. Vascular/Lymphatic: Normal course and caliber of the major abdominal vessels. --No retroperitoneal lymphadenopathy. --No mesenteric lymphadenopathy. --No pelvic or inguinal lymphadenopathy. Reproductive: Unremarkable Other: There is new free air scattered throughout the abdomen. The abdominal wall is normal. Musculoskeletal. No acute displaced fractures. IMPRESSION: New pneumoperitoneum. This is felt to be secondary to the previously demonstrated sigmoid diverticulitis. However, there are loops of small bowel in the low midline abdomen that demonstrate wall thickening and hyperenhancement. While these are felt to be reactive, they may also be the source of free air. These results will be called to the ordering clinician or representative by the Radiologist Assistant, and communication documented in the PACS or [employer]. Electronically Signed   By: Katherine Mantle M.D.   On: 03/20/2020 03:53   . cefTRIAXone (ROCEPHIN)  IV    . lactated ringers 85 mL/hr at 03/20/20 0633  . 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  Follow-up with colonoscopy.  He is scheduled to see Dr. Ewing Schlein for initial evaluation in March.  Once he has had his colonoscopy, then he can return and plan elective sigmoid colectomy by Dr. Cliffton Asters.  If he does not improve he would require a Hartman's procedure during this admission.  Agree with ongoing antibiotics, he can have sips and chips for now.  I would recommend holding his Eliquis and convert  to IV heparin during his hospitalization.  If he does require surgery this would expedite his care.     Sherrie George [facility] 03/20/2020, 7:49 AM Please see Amion for pager number during day hours 7:00am-4:30pm

## 2020-03-20 NOTE — Progress Notes (Signed)
ANTICOAGULATION CONSULT NOTE - Initial Consult  Pharmacy Consult for Heparin Indication: atrial fibrillation  Allergies  Allergen Reactions  . Amlodipine Palpitations  . Lisinopril Other (See Comments)    Elevated  creatinine     Patient Measurements: Height: 5\' 8"  (172.7 cm) Weight: 79.4 kg (175 lb) IBW/kg (Calculated) : 68.4 Heparin Dosing Weight: TBW  Vital Signs: Temp: 98.6 F (37 C) (03/01 1200) Temp Source: Oral (03/01 1200) BP: 158/94 (03/01 1200) Pulse Rate: 94 (03/01 1200)  Labs: Recent Labs    03/20/20 0014  HGB 15.7  HCT 45.9  PLT 253  CREATININE 1.54*    Estimated Creatinine Clearance: 45 mL/min (A) (by C-G formula based on SCr of 1.54 mg/dL (H)).   Medical History: Past Medical History:  Diagnosis Date  . CKD (chronic kidney disease), stage III (HCC)   . Diastolic dysfunction    a. 09/2015 Echo: EF 65-70%, no rwma, Gr1 DD, Mod AI. Asc AO 55mm. Mildly dil Asc Ao.  LA 75mm.  50m Dyslipidemia   . Hypertension   . Kidney stones   . Moderate aortic insufficiency    a. 09/2015 Echo: Mod AI.  10/2015 Right bundle branch block   . Testicular hypofunction     Medications:  Scheduled:  . Chlorhexidine Gluconate Cloth  6 each Topical Daily   Infusions:  . cefTRIAXone (ROCEPHIN)  IV Stopped (03/20/20 1249)  . lactated ringers 85 mL/hr at 03/20/20 0918  . metronidazole      Assessment: 16 yoM admitted with diverticulitis, microperforation, and possible plan for surgery.  PMH is significant for Afib on chronic apixaban anticoagulation.  Last dose was taken on 2/28 PM.  Pharmacy is consulted to dose Heparin while apixaban is held. Baseline aPTT pending CBC wnl SCr acutely elevated at 1.54  Goal of Therapy:  Heparin level 0.3-0.7 units/ml aPTT 66-102 seconds Monitor platelets by anticoagulation protocol: Yes   Plan:  No heparin bolus due to recent apixaban use Start heparin IV infusion at 1100 units/hr APTT in 6 hours after starting Daily aPTT, heparin  level and CBC Continue to monitor H&H and platelets   3/28 PharmD, BCPS Clinical Pharmacist [facility] main pharmacy [phone] 03/20/2020 12:53 PM

## 2020-03-20 NOTE — Progress Notes (Signed)
Pharmacy - IV heprin  Assessment:    Please see note from Lynann Beaver, PharmD earlier today for full details.  Briefly, 70 y.o. male on IV heparin for Afib while Eliquis on hold. Baseline heparin level elevated as expected from DOAC.   First aPTT SUBtherapeutic at 48 sec on 1100 units/hr  No bleeding or infusion issues per RN  Plan:   Increase heparin to 1400 units/hr  Recheck aPTT in 6 hrs  Bernadene Person, PharmD, BCPS [phone] 03/20/2020, 2:00 PM

## 2020-03-21 ENCOUNTER — Encounter (HOSPITAL_COMMUNITY): Payer: Self-pay | Admitting: Family Medicine

## 2020-03-21 DIAGNOSIS — I480 Paroxysmal atrial fibrillation: Secondary | ICD-10-CM | POA: Diagnosis not present

## 2020-03-21 DIAGNOSIS — K631 Perforation of intestine (nontraumatic): Secondary | ICD-10-CM | POA: Diagnosis not present

## 2020-03-21 LAB — CBC
HCT: 45.2 % (ref 39.0–52.0)
Hemoglobin: 14.8 g/dL (ref 13.0–17.0)
MCH: 30.6 pg (ref 26.0–34.0)
MCHC: 32.7 g/dL (ref 30.0–36.0)
MCV: 93.4 fL (ref 80.0–100.0)
Platelets: 200 10*3/uL (ref 150–400)
RBC: 4.84 MIL/uL (ref 4.22–5.81)
RDW: 14.7 % (ref 11.5–15.5)
WBC: 15.5 10*3/uL — ABNORMAL HIGH (ref 4.0–10.5)
nRBC: 0 % (ref 0.0–0.2)

## 2020-03-21 LAB — BASIC METABOLIC PANEL
Anion gap: 11 (ref 5–15)
BUN: 25 mg/dL — ABNORMAL HIGH (ref 8–23)
CO2: 21 mmol/L — ABNORMAL LOW (ref 22–32)
Calcium: 8.5 mg/dL — ABNORMAL LOW (ref 8.9–10.3)
Chloride: 104 mmol/L (ref 98–111)
Creatinine, Ser: 1.35 mg/dL — ABNORMAL HIGH (ref 0.61–1.24)
GFR, Estimated: 58 mL/min — ABNORMAL LOW (ref >60)
Glucose, Bld: 125 mg/dL — ABNORMAL HIGH (ref 70–99)
Potassium: 4.1 mmol/L (ref 3.5–5.1)
Sodium: 136 mmol/L (ref 135–145)

## 2020-03-21 LAB — HEPARIN LEVEL (UNFRACTIONATED): Heparin Unfractionated: 1.08 IU/mL — ABNORMAL HIGH (ref 0.30–0.70)

## 2020-03-21 LAB — MAGNESIUM: Magnesium: 2 mg/dL (ref 1.7–2.4)

## 2020-03-21 LAB — APTT
aPTT: 70 seconds — ABNORMAL HIGH (ref 24–36)
aPTT: 55 seconds — ABNORMAL HIGH (ref 24–36)
aPTT: 65 seconds — ABNORMAL HIGH (ref 24–36)

## 2020-03-21 MED ORDER — HEPARIN (PORCINE) 25000 UT/250ML-% IV SOLN: 1650.0000 [IU]/h | INTRAVENOUS | Status: DC

## 2020-03-21 MED ORDER — METOPROLOL TARTRATE 5 MG/5ML IV SOLN
2.5000 mg | Freq: Three times a day (TID) | INTRAVENOUS | Status: DC
  Administered 2020-03-21 – 2020-03-22 (×4): 2.5 mg via INTRAVENOUS
  Filled 2020-03-21 (×3): qty 5

## 2020-03-21 MED ORDER — HYDRALAZINE HCL 20 MG/ML IJ SOLN
10.0000 mg | INTRAMUSCULAR | Status: DC | PRN
  Administered 2020-03-21 – 2020-03-22 (×2): 10 mg via INTRAVENOUS
  Filled 2020-03-21 (×2): qty 1

## 2020-03-21 MED ORDER — HEPARIN (PORCINE) 25000 UT/250ML-% IV SOLN
1700.0000 [IU]/h | INTRAVENOUS | Status: DC
  Administered 2020-03-21 – 2020-03-22 (×2): 1700 [IU]/h via INTRAVENOUS
  Filled 2020-03-21 (×2): qty 250

## 2020-03-21 MED ORDER — POLYETHYLENE GLYCOL 3350 17 G PO PACK
17.0000 g | PACK | Freq: Every day | ORAL | Status: DC
  Administered 2020-03-21: 17 g via ORAL
  Filled 2020-03-21: qty 1

## 2020-03-21 NOTE — Progress Notes (Signed)
ANTICOAGULATION CONSULT NOTE - Follow Up Consult  Pharmacy Consult for Heparin Indication: atrial fibrillation  Allergies  Allergen Reactions  . Amlodipine Palpitations  . Lisinopril Other (See Comments)    Elevated  creatinine     Patient Measurements: Height: 5\' 8"  (172.7 cm) Weight: 79.4 kg (175 lb) IBW/kg (Calculated) : 68.4 Heparin Dosing Weight: TBW  Vital Signs: Temp: 98.5 F (36.9 C) (03/02 0800) Temp Source: Oral (03/02 0800) BP: 126/87 (03/02 1151) Pulse Rate: 105 (03/02 1151)  Labs: Recent Labs    03/20/20 0014 03/20/20 1328 03/20/20 1328 03/20/20 1329 03/20/20 2053 03/21/20 0442 03/21/20 1111  HGB 15.7  --   --   --   --  14.8  --   HCT 45.9  --   --   --   --  45.2  --   PLT 253  --   --   --   --  200  --   APTT  --  36   < >  --  48* 70* 55*  LABPROT  --  14.5  --   --   --   --   --   INR  --  1.2  --   --   --   --   --   HEPARINUNFRC  --   --   --  1.84*  --  1.08*  --   CREATININE 1.54*  --   --   --   --  1.35*  --    < > = values in this interval not displayed.    Estimated Creatinine Clearance: 51.4 mL/min (A) (by C-G formula based on SCr of 1.35 mg/dL (H)).   Medications:  Infusions:  . cefTRIAXone (ROCEPHIN)  IV 2 g (03/21/20 1150)  . heparin 1,400 Units/hr (03/21/20 0630)  . metronidazole Stopped (03/21/20 0630)    Assessment: 77 yoM admitted with diverticulitis, microperforation, and possible plan for surgery.  PMH is significant for Afib on chronic apixaban anticoagulation.  Last dose was taken on 2/28 PM.  Pharmacy is consulted to dose Heparin while apixaban is held.  HL 1.08, remains artificially elevated d/t recent DOAC use aPTT decreased to 55, subtherapeutic on heparin 1400 units/hr CBC wnl No s/s bleeding reported SCr acutely elevated but improved to 1.35   Goal of Therapy:  Heparin level 0.3-0.7 units/ml aPTT 66-102 seconds Monitor platelets by anticoagulation protocol: Yes   Plan:  Increase to heparin IV  infusion at 1650 units/hr APTT 6 hours after rate change Daily APTT, heparin level, and CBC Continue to follow up surgical plans   3/28 PharmD, BCPS Clinical Pharmacist [facility] main pharmacy [phone] 03/21/2020 12:24 PM

## 2020-03-21 NOTE — Progress Notes (Addendum)
PROGRESS NOTE    Fanny SkatesDennis C Mellone  RUE:[id number]:[id number] DOB: [birth year] DOA: 03/19/2020 PCP: Daisy Florooss, Charles Alan, MD   Brief Narrative:   Patient is a 70 y.o.male with medical history significant for hypertension, atrial fibrillation on Eliquis, mild renal insufficiency, and diverticulitis. Patient was treated conservatively for diverticulitis 2 months ago, with plans for surgical follow-up for possible sigmoid colectomy.  He had a recent admission in January 2022 for diverticulitis with abscess and C. difficile, not amenable to drainage and was discharged home with a 14 day course of Ceftriaxone and Flagyl. Patient reports taking some "left over Amoxicillin" on 03/10/19 for 1 week and then developed severe, sharp upper abdominal pain which started on 03/18/20.  He contacted a telehealth provider and was treated with prednisone. Subsequently, he developed a severe rash on his chest, abdomen, and groin along with severe RLQ abdominal pain. Patient presented to the ED on 03/19/20 with continued severe abdominal pain, primarily RLQ and rash.  ED Course: Afebrile and hemodynamically stable. Pertinent labs include leukocytosis to 18,400 and elevated creatinine at 1.54. CT abdomen reveals new pneumoperitoneum likely secondary to prior sigmoid diverticulitis. There are loops of small bowel in the low midline abdomen that demonstrate wall thickening and hyperenhancement felt to be reactive. Surgery was consulted and treatment started with Zosyn, Dilaudid, and Zofran.   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  #Hypertension, labile -metoprolol was changed to 2.5 mg IV q8hrs -Add hydralazine IV 10 mg q4 hrs, prn for B/P > 160/100    DVT prophylaxis:  Heparin gtt Code Status: Full  Family Communication:  None at bedside  Status is: Inpatient   Dispo: The patient is from: Home              Anticipated d/c is to: Home              Patient currently is not medically stable for discharge.    Difficult to place patient: No.   Body mass index is 26.61 kg/m.     Consultants:   GI  Surgery  Procedures:   None scheduled at this time followed by surgery, potential colectomy/colostomy  Antimicrobials:   Zosyn   Subjective:  Patient seen and examined. Denies nausea, vomiting, diarrhea, or constipation.  Has mild but unchanged lower right sided abdominal pain.  Examination:  General exam: Appears calm and comfortable  Respiratory system: Clear to auscultation. Respiratory effort normal. Cardiovascular system: S1 & S2 heard, RRR. No JVD, murmurs, rubs, gallops or clicks. No pedal edema. Gastrointestinal system: Abdomen is soft but distended with diffuse lower abdominal pain with palpation. No guarding or rebound tenderness. No organomegaly or masses felt. Normal bowel sounds heard. Central nervous system: Alert and oriented. No focal neurological deficits. Extremities: Symmetric 5 x 5 power. Skin: previously reported rash on chest, abdomen, and groin has mostly resolved. Psychiatry: Judgement and insight appear normal. Mood & affect appropriate.     Objective: Vitals:   03/21/20 0400 03/21/20 0500 03/21/20 0600 03/21/20 0800  BP: Marland Kitchen(!)  178/102 (!) 153/102 (!) 142/97 (!) 144/98  Pulse: (!) 108 (!) 102 (!) 105 (!) 106  Resp: (!) 21 18 15 14   Temp: 98.6 F (37 C)   98.5 F (36.9 C)  TempSrc: Oral   Oral  SpO2: 92% 91%  91% 92%  Weight:      Height:        Intake/Output Summary (Last 24 hours) at 03/21/2020 1120 Last data filed at 03/21/2020 0630 Gross per 24 hour  Intake 2200.75 ml  Output 875 ml  Net 1325.75 ml   Filed Weights   03/19/20 2359 03/20/20 0952  Weight: 81.6 kg 79.4 kg     Data Reviewed:   CBC: Recent Labs  Lab 03/20/20 0014 03/21/20 0442  WBC 18.4* 15.5*  NEUTROABS 15.4*  --   HGB 15.7 14.8  HCT 45.9 45.2  MCV 90.9 93.4  PLT 253 200   Basic Metabolic Panel: Recent Labs  Lab 03/20/20 0014 03/21/20 0442  NA 139 136  K 4.2 4.1  CL 105 104  CO2 23 21*  GLUCOSE 145* 125*  BUN 27* 25*  CREATININE 1.54* 1.35*  CALCIUM 9.1 8.5*  MG  --  2.0   GFR: Estimated Creatinine Clearance: 51.4 mL/min (A) (by C-G formula based on SCr of 1.35 mg/dL (H)). Liver Function Tests: Recent Labs  Lab 03/20/20 0014  AST 16  ALT 25  ALKPHOS 65  BILITOT 1.4*  PROT 7.6  ALBUMIN 4.3   Recent Labs  Lab 03/20/20 0014  LIPASE 37   No results for input(s): AMMONIA in the last 168 hours. Coagulation Profile: Recent Labs  Lab 03/20/20 1328  INR 1.2   Cardiac Enzymes: No results for input(s): CKTOTAL, CKMB, CKMBINDEX, TROPONINI in the last 168 hours. BNP (last 3 results) No results for input(s): PROBNP in the last 8760 hours. HbA1C: No results for input(s): HGBA1C in the last 72 hours. CBG: No results for input(s): GLUCAP in the last 168 hours. Lipid Profile: No results for input(s): CHOL, HDL, LDLCALC, TRIG, CHOLHDL, LDLDIRECT in the last 72 hours. Thyroid Function Tests: No results for input(s): TSH, T4TOTAL, FREET4, T3FREE, THYROIDAB in the last 72 hours. Anemia Panel: No results for input(s): VITAMINB12, FOLATE, FERRITIN, TIBC, IRON, RETICCTPCT in the last 72 hours. Sepsis Labs: No results for input(s): PROCALCITON, LATICACIDVEN in the last 168 hours.  Recent Results (from the past 240 hour(s))  Resp Panel by RT-PCR (Flu A&B, Covid) Nasopharyngeal Swab     Status:  None   Collection Time: 03/20/20  5:42 AM   Specimen: Nasopharyngeal Swab; Nasopharyngeal(NP) swabs in vial transport medium  Result Value Ref Range Status   SARS Coronavirus 2 by RT PCR NEGATIVE NEGATIVE Final    Comment: (NOTE) SARS-CoV-2 target nucleic acids are NOT DETECTED.  The SARS-CoV-2 RNA is generally detectable in upper respiratory specimens during the acute phase of infection. The lowest concentration of SARS-CoV-2 viral copies this assay can detect is 138 copies/mL. A negative result does not preclude SARS-Cov-2 infection and should not be used as the sole basis for treatment or other patient management decisions. A negative result may occur with  improper specimen collection/handling, submission of specimen other than nasopharyngeal swab, presence of viral mutation(s) within the areas targeted by this assay, and inadequate number of viral copies(<138 copies/mL). A negative result must be combined with clinical observations, patient history, and epidemiological information. The expected result is Negative.  Fact Sheet for Patients:  05/20/20  Fact Sheet for Healthcare Providers:  BloggerCourse.com  This  test is no t yet approved or cleared by the [place] and  has been authorized for detection and/or diagnosis of SARS-CoV-2 by FDA under an Emergency Use Authorization (EUA). This EUA will remain  in effect (meaning this test can be used) for the duration of the COVID-19 declaration under Section 564(b)(1) of the Act, 21 U.S.C.section 360bbb-3(b)(1), unless the authorization is terminated  or revoked sooner.       Influenza A by PCR NEGATIVE NEGATIVE Final   Influenza B by PCR NEGATIVE NEGATIVE Final    Comment: (NOTE) The Xpert Xpress SARS-CoV-2/FLU/RSV plus assay is intended as an aid in the diagnosis of influenza from Nasopharyngeal swab specimens and should not be used as a sole basis for  treatment. Nasal washings and aspirates are unacceptable for Xpert Xpress SARS-CoV-2/FLU/RSV testing.  Fact Sheet for Patients: BloggerCourse.com  Fact Sheet for Healthcare Providers: SeriousBroker.it  This test is not yet approved or cleared by the [place] FDA and has been authorized for detection and/or diagnosis of SARS-CoV-2 by FDA under an Emergency Use Authorization (EUA). This EUA will remain in effect (meaning this test can be used) for the duration of the COVID-19 declaration under Section 564(b)(1) of the Act, 21 U.S.C. section 360bbb-3(b)(1), unless the authorization is terminated or revoked.  Performed at [facility], [address], [place], [place] [address]   MRSA PCR Screening     Status: None   Collection Time: 03/20/20  9:39 AM   Specimen: Nasal Mucosa; Nasopharyngeal  Result Value Ref Range Status   MRSA by PCR NEGATIVE NEGATIVE Final    Comment:        The GeneXpert MRSA Assay (FDA approved for NASAL specimens only), is one component of a comprehensive MRSA colonization surveillance program. It is not intended to diagnose MRSA infection nor to guide or monitor treatment for MRSA infections. Performed at [facility], [address], [place], [place] [address]          Radiology Studies: CT ABDOMEN PELVIS W CONTRAST  Result Date: 03/20/2020 CLINICAL DATA:  Abdominal pain EXAM: CT ABDOMEN AND PELVIS WITH CONTRAST TECHNIQUE: Multidetector CT imaging of the abdomen and pelvis was performed using the standard protocol following bolus administration of intravenous contrast. CONTRAST:  46mL OMNIPAQUE IOHEXOL 300 MG/ML  SOLN COMPARISON:  February 20, 2020 FINDINGS: Lower chest: The lung bases are clear. The heart size is normal. Hepatobiliary: The liver is normal. Status post cholecystectomy.There is no biliary ductal dilation. Pancreas: Normal contours without ductal  dilatation. No peripancreatic fluid collection. Spleen: Unremarkable. Adrenals/Urinary Tract: --Adrenal glands: Unremarkable. --Right kidney/ureter: No hydronephrosis or radiopaque kidney stones. --Left kidney/ureter: No hydronephrosis or radiopaque kidney stones. --Urinary bladder: Unremarkable. Stomach/Bowel: --Stomach/Duodenum: No hiatal hernia or other gastric abnormality. Normal duodenal course and caliber. --Small bowel: There is diffuse circumferential wall thickening of multiple small bowel loops in the low midline abdomen. There are adjacent pockets of gas and free fluid. There is associated wall thickening of the nearby small bowel. There is adjacent fat stranding. --Colon: Again noted are colonic diverticula following the sigmoid colon. There is an apparent fistulous tract coursing superiorly from the sigmoid colon towards pockets of gas and fluid in the mid abdomen. --Appendix: Normal. Vascular/Lymphatic: Normal course and caliber of the major abdominal vessels. --No retroperitoneal lymphadenopathy. --No mesenteric lymphadenopathy. --No pelvic or inguinal lymphadenopathy. Reproductive: Unremarkable Other: There is new free air scattered throughout the abdomen. The abdominal wall is normal. Musculoskeletal. No acute displaced fractures. IMPRESSION: New  pneumoperitoneum. This is felt to be secondary to the previously demonstrated sigmoid diverticulitis. However, there are loops of small bowel in the low midline abdomen that demonstrate wall thickening and hyperenhancement. While these are felt to be reactive, they may also be the source of free air. These results will be called to the ordering clinician or representative by the Radiologist Assistant, and communication documented in the PACS or [employer]. Electronically Signed   By: Katherine Mantle M.D.   On: 03/20/2020 03:53        Scheduled Meds: . Chlorhexidine Gluconate Cloth  6 each Topical Daily  . metoprolol tartrate  2.5 mg  Intravenous Q8H  . polyethylene glycol  17 g Oral Daily   Continuous Infusions: . cefTRIAXone (ROCEPHIN)  IV Stopped (03/20/20 1249)  . heparin 1,400 Units/hr (03/21/20 0630)  . metronidazole Stopped (03/21/20 0630)     LOS: 1 day   Time spent= 35 mins  Meredeth Ide   [facility] hospitalist   If 7PM-7AM, please contact night-coverage  03/21/2020, 11:20 AM

## 2020-03-21 NOTE — Progress Notes (Signed)
Pharmacy Brief Note - Anticoagulation Follow Up:  Patient is on heparin drip for atrial fibrillation while apixaban on hold for potential surgery. For full note/history, please see note by Lynann Beaver, PharmD from earlier today.   Assessment:  APTT = 65 seconds is just slightly below therapeutic range on heparin infusion of 1650 units/hr  Confirmed with RN that heparin infusing at correct rate with no interruptions. No signs of bleeding.   Goal: HL = 0.3-0.7; aPTT 66 - 102 seconds *Monitoring using aPTT until HL and aPTT correlate  Plan:  Increase heparin infusion to 1700 units/hr  Check 8 hour HL & aPTT  CBC daily  Monitor for signs of bleeding  Cindi Carbon, PharmD 03/21/20 8:54 PM

## 2020-03-21 NOTE — Progress Notes (Signed)
ANTICOAGULATION CONSULT NOTE   Pharmacy Consult for Heparin Indication: atrial fibrillation  Allergies  Allergen Reactions  . Amlodipine Palpitations  . Lisinopril Other (See Comments)    Elevated  creatinine     Patient Measurements: Height: 5\' 8"  (172.7 cm) Weight: 79.4 kg (175 lb) IBW/kg (Calculated) : 68.4 Heparin Dosing Weight: TBW  Vital Signs: Temp: 98.6 F (37 C) (03/02 0400) Temp Source: Oral (03/02 0400) BP: 153/102 (03/02 0500) Pulse Rate: 102 (03/02 0500)  Labs: Recent Labs    03/20/20 0014 03/20/20 1328 03/20/20 1329 03/20/20 2053 03/21/20 0442  HGB 15.7  --   --   --  14.8  HCT 45.9  --   --   --  45.2  PLT 253  --   --   --  200  APTT  --  36  --  48* 70*  LABPROT  --  14.5  --   --   --   INR  --  1.2  --   --   --   HEPARINUNFRC  --   --  1.84*  --  1.08*  CREATININE 1.54*  --   --   --  1.35*    Estimated Creatinine Clearance: 51.4 mL/min (A) (by C-G formula based on SCr of 1.35 mg/dL (H)).   Medical History: Past Medical History:  Diagnosis Date  . CKD (chronic kidney disease), stage III (HCC)   . Diastolic dysfunction    a. 09/2015 Echo: EF 65-70%, no rwma, Gr1 DD, Mod AI. Asc AO 30mm. Mildly dil Asc Ao.  LA 63mm.  50m Dyslipidemia   . Hypertension   . Kidney stones   . Moderate aortic insufficiency    a. 09/2015 Echo: Mod AI.  10/2015 Right bundle branch block   . Testicular hypofunction     Medications:  Scheduled:  . Chlorhexidine Gluconate Cloth  6 each Topical Daily   Infusions:  . cefTRIAXone (ROCEPHIN)  IV Stopped (03/20/20 1249)  . heparin 1,400 Units/hr (03/20/20 2201)  . metronidazole 500 mg (03/21/20 0530)    Assessment: 24 yoM admitted with diverticulitis, microperforation, and possible plan for surgery.  PMH is significant for Afib on chronic apixaban anticoagulation.  Last dose was taken on 2/28 PM.  Pharmacy is consulted to dose Heparin while apixaban is held.  03/21/20  APTT = 70 sec therapeutic with heparin gtt @  1400 units/hr  HL = 1.08 (remains falsely elevated due to effects of apixaban, however trending down)  CBC wnl  SCr = 1.35 with CrCl ~ 79 ml/min  No complications of therapy noted  Goal of Therapy:  Heparin level 0.3-0.7 units/ml aPTT 66-102 seconds Monitor platelets by anticoagulation protocol: Yes   Plan:  Continue heparin IV infusion at 1400 units/hr Repeat APTT in 6 hours to confirm therapeutic dose Daily aPTT, heparin level and CBC Continue to monitor H&H and platelets  05/21/20, PharmD 03/21/2020 5:50 AM

## 2020-03-21 NOTE — Progress Notes (Signed)
Pt c/o being more "sweaty" after he receives dilaudid. Pt states he has noticed that after every time he gets that pain medication.  No other reaction noted upon assessment. MD [place] notified.

## 2020-03-21 NOTE — TOC Initial Note (Signed)
Transition of Care [facility]) - Initial/Assessment Note    Patient Details  Name: Daniel Aguirre MRN: [id number] Date of Birth: [birth year]  Transition of Care [facility]) CM/SW Contact:    Golda Acre, RN Phone Number: 03/21/2020, 7:22 AM  Clinical Narrative:                 Pleasant 70 year old male history of diverticulitis treated about a month ago medically.  He was followed by Dr. Cliffton Asters.  He developed a rash after taking some Augmentin due to increased abdominal pain.  After taking the prednisone, he developed severe abdominal pain.  He came in last night to be evaluated.  He is stable this morning.  Complains of some periumbilical and right lower quadrant abdominal pain.  On examination, he has no signs of peritonitis but is tender in his lower quadrants.  CT scan reviewed which shows microperforation without significant intra-abdominal contamination.  He is stable at this point and nontoxic appearing.  Plan is for IV antibiotics and bowel rest for now.  Ideally, he would benefit from recovery from this so he could undergo a 1 stage procedure if able.  If he fails medical management, he will require laparotomy with sigmoid colectomy and Hartman's procedure with colostomy  PLAN: to return to home with wife, will follow for toc needs s/p[ surgery. Expected Discharge Plan: Home/Self Care Barriers to Discharge: Continued Medical Work up   Patient Goals and CMS Choice Patient states their goals for this hospitalization and ongoing recovery are:: to go back home and get well CMS Medicare.gov Compare Post Acute Care list provided to:: Patient    Expected Discharge Plan and Services Expected Discharge Plan: Home/Self Care   Discharge Planning Services: CM Consult   Living arrangements for the past 2 months: Single Family Home                                      Prior Living Arrangements/Services Living arrangements for the past 2 months: Single Family Home Lives with::  Spouse Patient language and need for interpreter reviewed:: Yes Do you feel safe going back to the place where you live?: Yes      Need for Family Participation in Patient Care: Yes (Comment) Care giver support system in place?: Yes (comment)   Criminal Activity/Legal Involvement Pertinent to Current Situation/Hospitalization: No - Comment as needed  Activities of Daily Living Home Assistive Devices/Equipment: Eyeglasses ADL Screening (condition at time of admission) Patient's cognitive ability adequate to safely complete daily activities?: Yes Is the patient deaf or have difficulty hearing?: No Does the patient have difficulty seeing, even when wearing glasses/contacts?: No Does the patient have difficulty concentrating, remembering, or making decisions?: No Patient able to express need for assistance with ADLs?: Yes Does the patient have difficulty dressing or bathing?: No Independently performs ADLs?: Yes (appropriate for developmental age) Does the patient have difficulty walking or climbing stairs?: No Weakness of Legs: None Weakness of Arms/Hands: None  Permission Sought/Granted                  Emotional Assessment Appearance:: Appears stated age Attitude/Demeanor/Rapport: Engaged Affect (typically observed): Calm Orientation: : Oriented to Place,Oriented to Self,Oriented to  Time,Oriented to Situation Alcohol / Substance Use: Not Applicable Psych Involvement: No (comment)  Admission diagnosis:  Pneumoperitoneum [K66.8] Bowel perforation (HCC) [K63.1] Patient Active Problem List   Diagnosis Date Noted  . Bowel perforation (HCC)  03/20/2020  . Diarrhea   . Diverticulitis 01/23/2020  . Diverticulitis of large intestine with perforation and abscess without bleeding 01/23/2020  . Intra-abdominal abscess (HCC) 01/23/2020  . Educated about COVID-19 virus infection 09/28/2019  . Chronic kidney disease (CKD), stage II (mild) 09/28/2019  . Elevated troponin 09/28/2019   . Nonrheumatic aortic valve insufficiency 01/07/2018  . Abnormal EKG 01/07/2018  . Chest pressure   . A-fib (HCC) 12/20/2017  . HTN (hypertension) 12/20/2017  . HLD (hyperlipidemia) 12/20/2017  . CKD (chronic kidney disease) stage 3, GFR 30-59 ml/min (HCC) 12/20/2017   PCP:  Daisy Floro, MD Pharmacy:   [facility] [phone] [place], [place] - [address] [place] [address] Phone: [phone] Fax: [phone]  [id number] - [place], [place] - [address] AT [facility] [address] [place] [address] Phone: [phone] Fax: [phone]     Social Determinants of Health (SDOH) Interventions    Readmission Risk Interventions No flowsheet data found.

## 2020-03-21 NOTE — Progress Notes (Signed)
Subjective/Chief Complaint: Pt has lower abdominal pain but stable  No BM  And no N/V No fever or chills  Slight tachycardia  Overall comfortable    Objective: Vital signs in last 24 hours: Temp:  [97.8 F (36.6 C)-99.3 F (37.4 C)] 98.6 F (37 C) (03/02 0400) Pulse Rate:  [94-125] 106 (03/02 0800) Resp:  [14-24] 14 (03/02 0800) BP: (134-178)/(79-110) 144/98 (03/02 0800) SpO2:  [91 %-100 %] 92 % (03/02 0800) Weight:  [79.4 kg] 79.4 kg (03/01 0952) Last BM Date: 03/19/20  Intake/Output from previous day: 03/01 0701 - 03/02 0700 In: 2200.8 [I.V.:1902.2; IV Piggyback:298.6] Out: 875 [Urine:875] Intake/Output this shift: No intake/output data recorded.   General: pleasant, WD, WN white male who is laying in bed in NAD HEENT: head is normocephalic, atraumatic.  Sclera are noninjected.  Pupils are equal.  Ears and nose without any masses or lesions.  Mouth is pink and moist Heart: regular, rate, and rhythm.  Normal s1,s2. No obvious murmurs, gallops, or rubs noted.  Palpable radial and pedal pulses bilaterally Lungs: CTAB, no wheezes, rhonchi, or rales noted.  Respiratory effort nonlabored Abd: soft, RLQ tenderness noted no rebound or guarding distended  MS: all 4 extremities are symmetrical with no cyanosis, clubbing, or edema. Skin: He has a rash that starts at his chest goes down over his abdomen.  The rash is a deep reddish-purple in color on his chest.  He becomes a little less dense and more red over his abdomen.  He has some rash in his groin a raised patchy rash. Neuro: Cranial nerves 2-12 grossly intact, sensation is normal throughout Psych: A&Ox3 with an appropriate affect Lab Results:  Recent Labs    03/20/20 0014 03/21/20 0442  WBC 18.4* 15.5*  HGB 15.7 14.8  HCT 45.9 45.2  PLT 253 200   BMET Recent Labs    03/20/20 0014 03/21/20 0442  NA 139 136  K 4.2 4.1  CL 105 104  CO2 23 21*  GLUCOSE 145* 125*  BUN 27* 25*  CREATININE 1.54* 1.35*  CALCIUM  9.1 8.5*   PT/INR Recent Labs    03/20/20 1328  LABPROT 14.5  INR 1.2   ABG No results for input(s): PHART, HCO3 in the last 72 hours.  Invalid input(s): PCO2, PO2  Studies/Results: CT ABDOMEN PELVIS W CONTRAST  Result Date: 03/20/2020 CLINICAL DATA:  Abdominal pain EXAM: CT ABDOMEN AND PELVIS WITH CONTRAST TECHNIQUE: Multidetector CT imaging of the abdomen and pelvis was performed using the standard protocol following bolus administration of intravenous contrast. CONTRAST:  60mL OMNIPAQUE IOHEXOL 300 MG/ML  SOLN COMPARISON:  February 20, 2020 FINDINGS: Lower chest: The lung bases are clear. The heart size is normal. Hepatobiliary: The liver is normal. Status post cholecystectomy.There is no biliary ductal dilation. Pancreas: Normal contours without ductal dilatation. No peripancreatic fluid collection. Spleen: Unremarkable. Adrenals/Urinary Tract: --Adrenal glands: Unremarkable. --Right kidney/ureter: No hydronephrosis or radiopaque kidney stones. --Left kidney/ureter: No hydronephrosis or radiopaque kidney stones. --Urinary bladder: Unremarkable. Stomach/Bowel: --Stomach/Duodenum: No hiatal hernia or other gastric abnormality. Normal duodenal course and caliber. --Small bowel: There is diffuse circumferential wall thickening of multiple small bowel loops in the low midline abdomen. There are adjacent pockets of gas and free fluid. There is associated wall thickening of the nearby small bowel. There is adjacent fat stranding. --Colon: Again noted are colonic diverticula following the sigmoid colon. There is an apparent fistulous tract coursing superiorly from the sigmoid colon towards pockets of gas and fluid in the mid abdomen. --  Appendix: Normal. Vascular/Lymphatic: Normal course and caliber of the major abdominal vessels. --No retroperitoneal lymphadenopathy. --No mesenteric lymphadenopathy. --No pelvic or inguinal lymphadenopathy. Reproductive: Unremarkable Other: There is new free air scattered  throughout the abdomen. The abdominal wall is normal. Musculoskeletal. No acute displaced fractures. IMPRESSION: New pneumoperitoneum. This is felt to be secondary to the previously demonstrated sigmoid diverticulitis. However, there are loops of small bowel in the low midline abdomen that demonstrate wall thickening and hyperenhancement. While these are felt to be reactive, they may also be the source of free air. These results will be called to the ordering clinician or representative by the Radiologist Assistant, and communication documented in the PACS or [employer]. Electronically Signed   By: Katherine Mantle M.D.   On: 03/20/2020 03:53    Anti-infectives: Anti-infectives (From admission, onward)   Start     Dose/Rate Route Frequency Ordered Stop   03/20/20 1400  piperacillin-tazobactam (ZOSYN) IVPB 3.375 g  Status:  Discontinued        3.375 g 12.5 mL/hr over 240 Minutes Intravenous Every 8 hours 03/20/20 0623 03/20/20 0636   03/20/20 1200  cefTRIAXone (ROCEPHIN) 2 g in sodium chloride 0.9 % 100 mL IVPB        2 g 200 mL/hr over 30 Minutes Intravenous Every 24 hours 03/20/20 0636     03/20/20 1200  metroNIDAZOLE (FLAGYL) IVPB 500 mg        500 mg 100 mL/hr over 60 Minutes Intravenous Every 8 hours 03/20/20 0636     03/20/20 0530  piperacillin-tazobactam (ZOSYN) IVPB 3.375 g        3.375 g 100 mL/hr over 30 Minutes Intravenous  Once 03/20/20 0516 03/20/20 6333      Assessment/Plan: Atrial fibrillation on Eliquis Hypertension Mild renal insufficiency Rash -chest, abdomen, groin stable   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

## 2020-03-22 ENCOUNTER — Encounter (HOSPITAL_COMMUNITY): Payer: Self-pay | Admitting: Family Medicine

## 2020-03-22 DIAGNOSIS — I480 Paroxysmal atrial fibrillation: Secondary | ICD-10-CM | POA: Diagnosis not present

## 2020-03-22 DIAGNOSIS — K631 Perforation of intestine (nontraumatic): Secondary | ICD-10-CM | POA: Diagnosis not present

## 2020-03-22 DIAGNOSIS — K668 Other specified disorders of peritoneum: Secondary | ICD-10-CM

## 2020-03-22 DIAGNOSIS — N182 Chronic kidney disease, stage 2 (mild): Secondary | ICD-10-CM | POA: Diagnosis not present

## 2020-03-22 LAB — APTT
aPTT: 69 seconds — ABNORMAL HIGH (ref 24–36)
aPTT: 58 seconds — ABNORMAL HIGH (ref 24–36)

## 2020-03-22 LAB — CBC
HCT: 44 % (ref 39.0–52.0)
Hemoglobin: 14.7 g/dL (ref 13.0–17.0)
MCH: 31 pg (ref 26.0–34.0)
MCHC: 33.4 g/dL (ref 30.0–36.0)
MCV: 92.8 fL (ref 80.0–100.0)
Platelets: 202 10*3/uL (ref 150–400)
RBC: 4.74 MIL/uL (ref 4.22–5.81)
RDW: 14.3 % (ref 11.5–15.5)
WBC: 12.3 10*3/uL — ABNORMAL HIGH (ref 4.0–10.5)
nRBC: 0 % (ref 0.0–0.2)

## 2020-03-22 LAB — BASIC METABOLIC PANEL
Anion gap: 12 (ref 5–15)
BUN: 23 mg/dL (ref 8–23)
CO2: 21 mmol/L — ABNORMAL LOW (ref 22–32)
Calcium: 8.6 mg/dL — ABNORMAL LOW (ref 8.9–10.3)
Chloride: 105 mmol/L (ref 98–111)
Creatinine, Ser: 1.2 mg/dL (ref 0.61–1.24)
GFR, Estimated: >60 mL/min (ref >60)
Glucose, Bld: 127 mg/dL — ABNORMAL HIGH (ref 70–99)
Potassium: 3.7 mmol/L (ref 3.5–5.1)
Sodium: 138 mmol/L (ref 135–145)

## 2020-03-22 LAB — HEPARIN LEVEL (UNFRACTIONATED)
Heparin Unfractionated: 0.63 IU/mL (ref 0.30–0.70)
Heparin Unfractionated: 0.38 IU/mL (ref 0.30–0.70)

## 2020-03-22 MED ORDER — DILTIAZEM HCL-DEXTROSE 125-5 MG/125ML-% IV SOLN (PREMIX)
5.0000 mg/h | INTRAVENOUS | Status: DC
  Administered 2020-03-22: 15 mg/h via INTRAVENOUS
  Administered 2020-03-22: 5 mg/h via INTRAVENOUS
  Administered 2020-03-23 (×2): 15 mg/h via INTRAVENOUS
  Filled 2020-03-22 (×4): qty 125

## 2020-03-22 MED ORDER — HEPARIN (PORCINE) 25000 UT/250ML-% IV SOLN: 1750.0000 [IU]/h | INTRAVENOUS | Status: DC

## 2020-03-22 MED ORDER — METOPROLOL TARTRATE 5 MG/5ML IV SOLN: 2.5000 mg | Freq: Three times a day (TID) | INTRAVENOUS | Status: DC | PRN

## 2020-03-22 MED ORDER — ORAL CARE MOUTH RINSE
15.0000 mL | Freq: Two times a day (BID) | OROMUCOSAL | Status: DC
  Administered 2020-03-22 – 2020-04-01 (×19): 15 mL via OROMUCOSAL

## 2020-03-22 NOTE — Progress Notes (Signed)
ANTICOAGULATION CONSULT NOTE - Follow Up Consult  Pharmacy Consult for Heparin Indication: atrial fibrillation  Allergies  Allergen Reactions  . Amlodipine Palpitations  . Lisinopril Other (See Comments)    Elevated  creatinine     Patient Measurements: Height: 5\' 8"  (172.7 cm) Weight: 79.4 kg (175 lb) IBW/kg (Calculated) : 68.4 Heparin Dosing Weight: TBW  Vital Signs: Temp: 97.5 F (36.4 C) (03/03 0400) Temp Source: Oral (03/03 0400) BP: 134/75 (03/03 0520) Pulse Rate: 108 (03/03 0520)  Labs: Recent Labs    03/20/20 0014 03/20/20 1328 03/20/20 1329 03/20/20 2053 03/21/20 0442 03/21/20 1111 03/21/20 1934 03/22/20 0441  HGB 15.7  --   --   --  14.8  --   --  14.7  HCT 45.9  --   --   --  45.2  --   --  44.0  PLT 253  --   --   --  200  --   --  202  APTT  --  36  --    < > 70* 55* 65* 69*  LABPROT  --  14.5  --   --   --   --   --   --   INR  --  1.2  --   --   --   --   --   --   HEPARINUNFRC  --   --  1.84*  --  1.08*  --   --  0.63  CREATININE 1.54*  --   --   --  1.35*  --   --  1.20   < > = values in this interval not displayed.    Estimated Creatinine Clearance: 57.8 mL/min (by C-G formula based on SCr of 1.2 mg/dL).   Medications:  Infusions:  . cefTRIAXone (ROCEPHIN)  IV Stopped (03/21/20 1242)  . heparin 1,700 Units/hr (03/21/20 2138)  . metronidazole Stopped (03/21/20 2255)    Assessment: 52 yoM admitted with diverticulitis, microperforation, and possible plan for surgery.  PMH is significant for Afib on chronic apixaban anticoagulation.  Last dose was taken on 2/28 PM.  Pharmacy is consulted to dose Heparin while apixaban is held.   aPTT 69, therapeutic on heparin 1700 units/hr HL 0.63  CBC wnl No s/s bleeding noted SCr acutely elevated but improved to 1.2   Goal of Therapy:  Heparin level 0.3-0.7 units/ml aPTT 66-102 seconds Monitor platelets by anticoagulation protocol: Yes   Plan:  Continue heparin IV infusion at 1700  units/hr Repeat aPTT and HL in 6 hr - if both levels correlate again, can continue monitoring heparin with just heparin levels Daily HL and CBC Continue to follow up surgical plans  3/28, PharmD 03/22/2020 6:01 AM

## 2020-03-22 NOTE — Progress Notes (Signed)
Pt's BP 178/101, 10 mg Hydralazine given as ordered. BP dropped to 119/87. Pt reports feeling out of breathe (O2 96%)  and jittery. Will continue to monitor.

## 2020-03-22 NOTE — Progress Notes (Signed)
Telemetry monitor showing v-tach for several minutes. Patient denies pain, but has c/o shortness of breath and feeling jittery tonight. Paged on call provider, Anna Genre, NP. Obtaining EKG.

## 2020-03-22 NOTE — Progress Notes (Signed)
PROGRESS NOTE    Daniel Aguirre  SWN:[id number] DOB: March 24, 1950 DOA: 03/19/2020 PCP: Daisy Floro, MD   Brief Narrative:   Patient is a 70 y.o.male with medical history significant for hypertension, atrial fibrillation on Eliquis, mild renal insufficiency, and diverticulitis. Patient was treated conservatively for diverticulitis 2 months ago, with plans for surgical follow-up for possible sigmoid colectomy.  He had a recent admission in January 2022 for diverticulitis with abscess and C. difficile, not amenable to drainage and was discharged home with a 14 day course of Ceftriaxone and Flagyl. Patient reports taking some "left over Amoxicillin" on 03/10/19 for 1 week and then developed severe, sharp upper abdominal pain which started on 03/18/20.  He contacted a telehealth provider and was treated with prednisone. Subsequently, he developed a severe rash on his chest, abdomen, and groin along with severe RLQ abdominal pain. Patient presented to the ED on 03/19/20 with continued severe abdominal pain, primarily RLQ and rash.  ED Course: Afebrile and hemodynamically stable. Pertinent labs include leukocytosis to 18,400 and elevated creatinine at 1.54. CT abdomen reveals new pneumoperitoneum likely secondary to prior sigmoid diverticulitis. There are loops of small bowel in the low midline abdomen that demonstrate wall thickening and hyperenhancement felt to be reactive. Surgery was consulted and treatment started with Zosyn, Dilaudid, and Zofran.   Patient was given hydralazine prn once during the night 03/21/20-03/22/20 and once this morning and reported feeling jittery from it. Hydralazine discontinued and patient started on a Cardizem gtt for hypertension.  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   #Afib with mild tachycardia, HR low 100's on Eliquis -Patient's scheduled metoprolol was stopped and patient started on Cardizem gtt -Discontinue heparin gtt, because of small amount of rectal bleeding -EKG done on 3/3 shows bifascicular block  #CKD stage II -Crt 1.54->1.35->1.20 -Continue to monitor.  #Hypertension, labile -Metoprolol was changed to 2.5 mg IV q8hrs on 3/2, which lowered b/p but did not achieve rate control and hydralazine IV 10 mg q4 hrs, prn for B/P > 160/100 was added yesterday -Patient was given hydralazine prn once during the night 03/21/20-03/22/20 and once this morning and reported feeling jittery from it.  -Hydralazine discontinued and patient started on metoprolol was changed to 2.5 mg IV q8 hrs prn.   DVT prophylaxis: Heparin gtt Code Status: Full Family Communication:  None at bedside  Status is: Inpatient   Dispo: The patient is from: Home              Anticipated d/c is to: Home              Patient currently is not medically stable for discharge.   Difficult to place patient: No   Body mass index is 26.61 kg/m.   Consultants:   GI  Surgery  Procedures:   None scheduled at this time followed by surgery, potential colectomy/colostomy  Antimicrobials:   Rocephin and Flagyl   Subjective:  Patient seen and examined at bedside. Patient became nauseated and then started vomiting. Patient had little warning for the nausea and vomiting but reported having a very bad night last with night not being able to sleep and having  a sensation that he was being smothered and could not breath.  His abdomen is still significantly distended with tenderness on the left side, but had multiple bowel movements yesterday.   Examination:  General  exam: Alert, cooperative, not in acute distress, but actively vomiting with a nausea and a distended abdomen. Respiratory system: Clear to auscultation. Respiratory effort normal. Cardiovascular system: S1 & S2 heard, RRR. No JVD, atrial and mitral murmur present. No pedal edema. Gastrointestinal system: Patient is vomiting this morning. Abdomen is significantly distended.  Bowel sounds are hypoactive. Pt had 4 BM's yesterday. No organomegaly or masses felt.  Central nervous system: Alert and oriented. No focal neurological deficits. Extremities: Symmetric 5 x 5 power. Skin: No rashes, lesions or ulcers Psychiatry: Judgement and insight appear normal. Mood & affect appropriate.     Objective: Vitals:   03/22/20 0715 03/22/20 0800 03/22/20 0900 03/22/20 1000  BP:   (!) 165/103   Pulse:  (!) 107 (!) 122 (!) 114  Resp:  19 17 15   Temp: 98 F (36.7 C)     TempSrc: Oral     SpO2:  94% 93% 93%  Weight:      Height:        Intake/Output Summary (Last 24 hours) at 03/22/2020 1054 Last data filed at 03/22/2020 0825 Gross per 24 hour  Intake 562.48 ml  Output 300 ml  Net 262.48 ml   Filed Weights   03/19/20 2359 03/20/20 0952  Weight: 81.6 kg 79.4 kg     Data Reviewed:   CBC: Recent Labs  Lab 03/20/20 0014 03/21/20 0442 03/22/20 0441  WBC 18.4* 15.5* 12.3*  NEUTROABS 15.4*  --   --   HGB 15.7 14.8 14.7  HCT 45.9 45.2 44.0  MCV 90.9 93.4 92.8  PLT 253 200 202   Basic Metabolic Panel: Recent Labs  Lab 03/20/20 0014 03/21/20 0442 03/22/20 0441  NA 139 136 138  K 4.2 4.1 3.7  CL 105 104 105  CO2 23 21* 21*  GLUCOSE 145* 125* 127*  BUN 27* 25* 23  CREATININE 1.54* 1.35* 1.20  CALCIUM 9.1 8.5* 8.6*  MG  --  2.0  --    GFR: Estimated Creatinine Clearance: 57.8 mL/min (by C-G formula based on SCr of 1.2 mg/dL). Liver Function Tests: Recent Labs  Lab 03/20/20 0014  AST 16  ALT 25  ALKPHOS 65  BILITOT 1.4*  PROT 7.6  ALBUMIN 4.3   Recent Labs  Lab  03/20/20 0014  LIPASE 37   No results for input(s): AMMONIA in the last 168 hours. Coagulation Profile: Recent Labs  Lab 03/20/20 1328  INR 1.2   Cardiac Enzymes: No results for input(s): CKTOTAL, CKMB, CKMBINDEX, TROPONINI in the last 168 hours. BNP (last 3 results) No results for input(s): PROBNP in the last 8760 hours. HbA1C: No results for input(s): HGBA1C in the last 72 hours. CBG: No results for input(s): GLUCAP in the last 168 hours. Lipid Profile: No results for input(s): CHOL, HDL, LDLCALC, TRIG, CHOLHDL, LDLDIRECT in the last 72 hours. Thyroid Function Tests: No results for input(s): TSH, T4TOTAL, FREET4, T3FREE, THYROIDAB in the last 72 hours. Anemia Panel: No results for input(s): VITAMINB12, FOLATE, FERRITIN, TIBC, IRON, RETICCTPCT in the last 72 hours. Sepsis Labs: No results for input(s): PROCALCITON, LATICACIDVEN in the last 168 hours.  Recent Results (from the past 240 hour(s))  Resp Panel by RT-PCR (Flu A&B, Covid) Nasopharyngeal Swab     Status: None   Collection Time: 03/20/20  5:42 AM   Specimen: Nasopharyngeal Swab; Nasopharyngeal(NP) swabs in vial transport medium  Result Value Ref Range Status   SARS Coronavirus 2 by RT PCR NEGATIVE NEGATIVE Final    Comment: (NOTE) SARS-CoV-2 target nucleic acids are NOT DETECTED.  The SARS-CoV-2 RNA is generally detectable in upper respiratory specimens during the acute phase of infection. The lowest concentration of SARS-CoV-2 viral copies this assay can detect is 138 copies/mL. A negative result does not preclude SARS-Cov-2 infection and should not be used as the sole basis for treatment or other patient management decisions. A negative result may occur with  improper specimen collection/handling, submission of specimen other than nasopharyngeal swab, presence of viral mutation(s) within the areas targeted by this assay, and inadequate number of viral copies(<138 copies/mL). A negative result must be combined  with clinical observations, patient history, and epidemiological information. The expected result is Negative.  Fact Sheet for Patients:  BloggerCourse.com  Fact Sheet for Healthcare Providers:  SeriousBroker.it  This test is no t yet approved or cleared by the [place] FDA and  has been authorized for detection and/or diagnosis of SARS-CoV-2 by FDA under an Emergency Use Authorization (EUA). This EUA will remain  in effect (meaning this test can be used) for the duration of the COVID-19 declaration under Section 564(b)(1) of the Act, 21 U.S.C.section 360bbb-3(b)(1), unless the authorization is terminated  or revoked sooner.       Influenza A by PCR NEGATIVE NEGATIVE Final   Influenza B by PCR NEGATIVE NEGATIVE Final    Comment: (NOTE) The Xpert Xpress SARS-CoV-2/FLU/RSV plus assay is intended as an aid in the diagnosis of influenza from Nasopharyngeal swab specimens and should not be used as a sole basis for treatment. Nasal washings and aspirates are unacceptable for Xpert Xpress SARS-CoV-2/FLU/RSV testing.  Fact Sheet for Patients: BloggerCourse.com  Fact Sheet for Healthcare Providers: SeriousBroker.it  This test is not yet approved or cleared by the [place] FDA and has been authorized for detection and/or diagnosis of SARS-CoV-2 by FDA under an Emergency Use Authorization (EUA). This EUA will remain in effect (meaning this test can be used) for the duration of the COVID-19 declaration under Section 564(b)(1) of the Act, 21 U.S.C. section 360bbb-3(b)(1), unless the authorization is terminated or revoked.  Performed at [facility], [address] [place], [place] [address]   MRSA PCR Screening     Status: None   Collection Time: 03/20/20  9:39 AM   Specimen: Nasal Mucosa; Nasopharyngeal  Result Value Ref Range Status   MRSA by PCR  NEGATIVE NEGATIVE Final    Comment:        The GeneXpert MRSA Assay (FDA approved for NASAL specimens only), is one component of a comprehensive MRSA colonization surveillance program. It is not intended to diagnose MRSA infection nor to guide or monitor treatment for MRSA infections. Performed at [facility], [address], [place], [place] [address]          Radiology Studies: No results found.      Scheduled Meds: . Chlorhexidine Gluconate Cloth  6 each Topical Daily   Continuous Infusions: . cefTRIAXone (ROCEPHIN)  IV Stopped (03/21/20 1242)  . diltiazem (CARDIZEM) infusion 10 mg/hr (03/22/20 0947)  . heparin 1,700 Units/hr (03/22/20 0600)  . metronidazole Stopped (03/22/20 0722)     LOS: 2 days   Time spent= 35 mins    Shearon Balo, RN NP-Student   If 7PM-7AM, please contact night-coverage  03/22/2020, 10:54 AM

## 2020-03-22 NOTE — Progress Notes (Signed)
ANTICOAGULATION CONSULT NOTE - Follow Up Consult  Pharmacy Consult for Heparin Indication: atrial fibrillation  Allergies  Allergen Reactions  . Amlodipine Palpitations  . Lisinopril Other (See Comments)    Elevated  creatinine     Patient Measurements: Height: 5\' 8"  (172.7 cm) Weight: 79.4 kg (175 lb) IBW/kg (Calculated) : 68.4 Heparin Dosing Weight: TBW  Vital Signs: Temp: 98.5 F (36.9 C) (03/03 1105) Temp Source: Oral (03/03 1105) BP: 134/83 (03/03 1200) Pulse Rate: 109 (03/03 1200)  Labs: Recent Labs    03/20/20 0014 03/20/20 1328 03/20/20 1329 03/21/20 0442 03/21/20 1111 03/21/20 1934 03/22/20 0441 03/22/20 1109  HGB 15.7  --   --  14.8  --   --  14.7  --   HCT 45.9  --   --  45.2  --   --  44.0  --   PLT 253  --   --  200  --   --  202  --   APTT  --  36   < > 70*   < > 65* 69* 58*  LABPROT  --  14.5  --   --   --   --   --   --   INR  --  1.2  --   --   --   --   --   --   HEPARINUNFRC  --   --    < > 1.08*  --   --  0.63 0.38  CREATININE 1.54*  --   --  1.35*  --   --  1.20  --    < > = values in this interval not displayed.    Estimated Creatinine Clearance: 57.8 mL/min (by C-G formula based on SCr of 1.2 mg/dL).   Medications:  Infusions:  . cefTRIAXone (ROCEPHIN)  IV 2 g (03/22/20 1211)  . diltiazem (CARDIZEM) infusion 12.5 mg/hr (03/22/20 1211)  . heparin 1,700 Units/hr (03/22/20 1218)  . metronidazole Stopped (03/22/20 05/22/20)    Assessment: 58 yoM admitted with diverticulitis, microperforation, and possible plan for surgery.  PMH is significant for Afib on chronic apixaban anticoagulation.  Last dose was taken on 2/28 PM.  Pharmacy is consulted to dose Heparin while apixaban is held.   aPTT 58, subtherapeutic on heparin 1700 units/hr HL 0.38 low-end therapeutic CBC wnl No s/s bleeding noted or issues with infusion site SCr acutely elevated but improved to 1.2   Goal of Therapy:  Heparin level 0.3-0.7 units/ml aPTT 66-102  seconds Monitor platelets by anticoagulation protocol: Yes   Plan:  Increase heparin IV infusion slightly to 1750 units/hr Repeat aPTT and HL in 6 hr - if both levels correlate again, can continue monitoring heparin with just heparin levels Daily HL and CBC Continue to follow up surgical plans  3/28, PharmD, BCPS Pharmacy: [phone] 03/22/2020 12:56 PM

## 2020-03-22 NOTE — Progress Notes (Addendum)
[facility]  PROGRESS NOTE  Daniel Aguirre:[id number] DOB: 07/12/1950 DOA: 03/19/2020 PCP: Daisy Floro, MD   Brief HPI:   70 year old male with medical history of hypertension, mild renal insufficiency, atrial fibrillation on Eliquis and admission in early January for diverticulitis with abscess presented to ED with complaints of severe abdominal pain.  Patient was managed conservatively 2 months ago for diverticulitis with abscess that was not amenable to IR drainage.  At that time he was discharged on 4 weeks of outpatient antibiotics with Augmentin.  Patient said that he was doing fine and did developed abdominal pain a week ago, he started taking Augmentin which was left over from previous prescription.  Patient waterbrash on his chest abdominal.  He was seen by telehealth provider and was given prednisone 50 mg for 2 days.  After that patient developed left lower quadrant pain and came to ED for further evaluation. In the ED CT scan of the abdomen pelvis showed new pneumoperitoneum, this was due to previously demonstrated sigmoid diverticulitis.  General surgery was consulted.  Patient started on IV antibiotics including ceftriaxone and Flagyl.    Subjective   Patient seen and examined, says that he had nightmares last night.  Blood pressure dropped after received hydralazine.  Vomiting this morning.  Still has abdominal distention and pain.   Assessment/Plan:     1. Sigmoid diverticulitis with perforation/fistulous tract-patient presented with abdominal pain, CT abdomen pelvis shows pneumoperitoneum.  General surgery was consulted.  Started on IV antibiotics initially Zosyn which was changed to ceftriaxone and Flagyl.  Surgery plans to treat conservatively for now.  If no improvement then plan for colectomy/colostomy. 2. Atrial fibrillation with RVR-patient has history of A. fib and is on Eliquis at home.  He went into A. fib with RVR, initially started on IV metoprolol  however blood pressure dropped yesterday and heart rate not controlled.  We will switch to IV Cardizem gtt.  Patient started on IV heparin GTT. 3. Hematochezia-patient had small amount of bright red blood per rectum.  We will hold heparin at this time. 4. Hypertension-continue IV Cardizem gtt., will also start metoprolol 2.5 mg IV every 8 hours as needed for BP more than 160/100.  Will discontinue hydralazine. 5. CKD stage II-creatinine stable at 1.20.     COVID-19 Labs  No results for input(s): DDIMER, FERRITIN, LDH, CRP in the last 72 hours.  Lab Results  Component Value Date   SARSCOV2NAA NEGATIVE 03/20/2020   SARSCOV2NAA NEGATIVE 01/23/2020     Scheduled medications:   . Chlorhexidine Gluconate Cloth  6 each Topical Daily  . mouth rinse  15 mL Mouth Rinse BID         CBG: No results for input(s): GLUCAP in the last 168 hours.  SpO2: 94 %    CBC: Recent Labs  Lab 03/20/20 0014 03/21/20 0442 03/22/20 0441  WBC 18.4* 15.5* 12.3*  NEUTROABS 15.4*  --   --   HGB 15.7 14.8 14.7  HCT 45.9 45.2 44.0  MCV 90.9 93.4 92.8  PLT 253 200 202    Basic Metabolic Panel: Recent Labs  Lab 03/20/20 0014 03/21/20 0442 03/22/20 0441  NA 139 136 138  K 4.2 4.1 3.7  CL 105 104 105  CO2 23 21* 21*  GLUCOSE 145* 125* 127*  BUN 27* 25* 23  CREATININE 1.54* 1.35* 1.20  CALCIUM 9.1 8.5* 8.6*  MG  --  2.0  --      Liver Function Tests: Recent Labs  Lab 03/20/20 0014  AST 16  ALT 25  ALKPHOS 65  BILITOT 1.4*  PROT 7.6  ALBUMIN 4.3     Antibiotics: Anti-infectives (From admission, onward)   Start     Dose/Rate Route Frequency Ordered Stop   03/20/20 1400  piperacillin-tazobactam (ZOSYN) IVPB 3.375 g  Status:  Discontinued        3.375 g 12.5 mL/hr over 240 Minutes Intravenous Every 8 hours 03/20/20 0623 03/20/20 0636   03/20/20 1200  cefTRIAXone (ROCEPHIN) 2 g in sodium chloride 0.9 % 100 mL IVPB        2 g 200 mL/hr over 30 Minutes Intravenous Every 24  hours 03/20/20 0636     03/20/20 1200  metroNIDAZOLE (FLAGYL) IVPB 500 mg        500 mg 100 mL/hr over 60 Minutes Intravenous Every 8 hours 03/20/20 0636     03/20/20 0530  piperacillin-tazobactam (ZOSYN) IVPB 3.375 g        3.375 g 100 mL/hr over 30 Minutes Intravenous  Once 03/20/20 0516 03/20/20 0609       DVT prophylaxis: SCDs  Code Status: Full code  Family Communication: No family at bedside   Consultants:  General surgery  Procedures:      Objective   Vitals:   03/22/20 1000 03/22/20 1100 03/22/20 1105 03/22/20 1200  BP: (!) 172/103 (!) 136/93  134/83  Pulse: (!) 114 (!) 103  (!) 109  Resp: 15   (!) 24  Temp:   98.5 F (36.9 C)   TempSrc:   Oral   SpO2: 93% 95%  94%  Weight:      Height:        Intake/Output Summary (Last 24 hours) at 03/22/2020 1434 Last data filed at 03/22/2020 1117 Gross per 24 hour  Intake 891.42 ml  Output 300 ml  Net 591.42 ml    03/01 1901 - 03/03 0700 In: 1855.1 [I.V.:1456.5] Out: 725 [Urine:725]  Filed Weights   03/19/20 2359 03/20/20 0952  Weight: 81.6 kg 79.4 kg    Physical Examination:    General-appears in no acute distress  Heart-S1-S2, irregular, no murmur auscultated  Lungs-clear to auscultation bilaterally, no wheezing or crackles auscultated  Abdomen-soft, distended, mildly tender to palpation  Extremities-no edema in the lower extremities  Neuro-alert, oriented x3, no focal deficit noted   Status is: Inpatient  Dispo: The patient is from: Home              Anticipated d/c is to: Home              Anticipated d/c date is: 03/26/2020              Patient currently not stable for discharge  Barrier to discharge-ongoing management for diverticulitis abscess with perforation.        Data Reviewed:   Recent Results (from the past 240 hour(s))  Resp Panel by RT-PCR (Flu A&B, Covid) Nasopharyngeal Swab     Status: None   Collection Time: 03/20/20  5:42 AM   Specimen: Nasopharyngeal Swab;  Nasopharyngeal(NP) swabs in vial transport medium  Result Value Ref Range Status   SARS Coronavirus 2 by RT PCR NEGATIVE NEGATIVE Final    Comment: (NOTE) SARS-CoV-2 target nucleic acids are NOT DETECTED.  The SARS-CoV-2 RNA is generally detectable in upper respiratory specimens during the acute phase of infection. The lowest concentration of SARS-CoV-2 viral copies this assay can detect is 138 copies/mL. A negative result does not preclude SARS-Cov-2 infection and should  not be used as the sole basis for treatment or other patient management decisions. A negative result may occur with  improper specimen collection/handling, submission of specimen other than nasopharyngeal swab, presence of viral mutation(s) within the areas targeted by this assay, and inadequate number of viral copies(<138 copies/mL). A negative result must be combined with clinical observations, patient history, and epidemiological information. The expected result is Negative.  Fact Sheet for Patients:  BloggerCourse.com  Fact Sheet for Healthcare Providers:  SeriousBroker.it  This test is no t yet approved or cleared by the [place] FDA and  has been authorized for detection and/or diagnosis of SARS-CoV-2 by FDA under an Emergency Use Authorization (EUA). This EUA will remain  in effect (meaning this test can be used) for the duration of the COVID-19 declaration under Section 564(b)(1) of the Act, 21 U.S.C.section 360bbb-3(b)(1), unless the authorization is terminated  or revoked sooner.       Influenza A by PCR NEGATIVE NEGATIVE Final   Influenza B by PCR NEGATIVE NEGATIVE Final    Comment: (NOTE) The Xpert Xpress SARS-CoV-2/FLU/RSV plus assay is intended as an aid in the diagnosis of influenza from Nasopharyngeal swab specimens and should not be used as a sole basis for treatment. Nasal washings and aspirates are unacceptable for Xpert Xpress  SARS-CoV-2/FLU/RSV testing.  Fact Sheet for Patients: BloggerCourse.com  Fact Sheet for Healthcare Providers: SeriousBroker.it  This test is not yet approved or cleared by the [place] FDA and has been authorized for detection and/or diagnosis of SARS-CoV-2 by FDA under an Emergency Use Authorization (EUA). This EUA will remain in effect (meaning this test can be used) for the duration of the COVID-19 declaration under Section 564(b)(1) of the Act, 21 U.S.C. section 360bbb-3(b)(1), unless the authorization is terminated or revoked.  Performed at [facility], [address], [place], [place] [address]   MRSA PCR Screening     Status: None   Collection Time: 03/20/20  9:39 AM   Specimen: Nasal Mucosa; Nasopharyngeal  Result Value Ref Range Status   MRSA by PCR NEGATIVE NEGATIVE Final    Comment:        The GeneXpert MRSA Assay (FDA approved for NASAL specimens only), is one component of a comprehensive MRSA colonization surveillance program. It is not intended to diagnose MRSA infection nor to guide or monitor treatment for MRSA infections. Performed at [facility], [address] [place], [place] [address]     Recent Labs  Lab 03/20/20 [phone]  LIPASE 37        Jacinda Kanady S Lenore Moyano   [facility] If 7PM-7AM, please contact night-coverage at [email or web address], Office  [phone]   03/22/2020, 2:34 PM  LOS: 2 days

## 2020-03-22 NOTE — Progress Notes (Signed)
    CC: Abdominal pain  Subjective: He had a bad night last night.  Could not sleep, significant BP drop after being treated with medication.  He said he had bad dreams, he also felt like he was smothering, like there was a mask on his face.  He says he drank all the clear liquids he could, had multiple BMs yesterday.  He still remains markedly distended and uncomfortable.  Continues to have discomfort on the left side.  Objective: Vital signs in last 24 hours: Temp:  [97.5 F (36.4 C)-99.5 F (37.5 C)] 97.5 F (36.4 C) (03/03 0400) Pulse Rate:  [104-118] 108 (03/03 0520) Resp:  [13-19] 16 (03/03 0520) BP: (119-178)/(75-112) 134/75 (03/03 0520) SpO2:  [91 %-96 %] 96 % (03/03 0520) Last BM Date: 03/21/20 Nothing p.o. recorded 562 IV record 150 urine recorded BM x4 recorded Afebrile, tachycardic, blood pressure 125/75-160/101 CO2 21, glucose 127, creatinine 1.20 WBC 12.3 CT 03/20/20 Intake/Output from previous day: 03/02 0701 - 03/03 0700 In: 562.5 [I.V.:262.5; IV Piggyback:300] Out: 150 [Urine:150] Intake/Output this shift: No intake/output data recorded.  General appearance: alert, cooperative, no distress and had a bad night, could not sleep, felt short of breath, mask was over his face.  He remains distended, having multiple bowel movements yesterday. Resp: clear to auscultation bilaterally Cardiac: He has both an atrial and mitral murmur GI: Abdomen still quite distended.  Still tender on the left.  Bowel sounds are hypoactive.  He had 4 bowel movements yesterday.  Lab Results:  Recent Labs    03/21/20 0442 03/22/20 0441  WBC 15.5* 12.3*  HGB 14.8 14.7  HCT 45.2 44.0  PLT 200 202    BMET Recent Labs    03/21/20 0442 03/22/20 0441  NA 136 138  K 4.1 3.7  CL 104 105  CO2 21* 21*  GLUCOSE 125* 127*  BUN 25* 23  CREATININE 1.35* 1.20  CALCIUM 8.5* 8.6*   PT/INR Recent Labs    03/20/20 1328  LABPROT 14.5  INR 1.2    Recent Labs  Lab 03/20/20 0014   AST 16  ALT 25  ALKPHOS 65  BILITOT 1.4*  PROT 7.6  ALBUMIN 4.3     Lipase     Component Value Date/Time   LIPASE 37 03/20/2020 0014     Medications: . Chlorhexidine Gluconate Cloth  6 each Topical Daily  . metoprolol tartrate  2.5 mg Intravenous Q8H  . polyethylene glycol  17 g Oral Daily    Assessment/Plan Atrial fibrillation on Eliquis Hypertension Mild renal insufficiency  -Creatinine 1.54>> 1.35>> 1.20 Rash-chest, abdomen, groin stable   Recurrent sigmoid diverticulitis with perforation/fistulous tract -WBC 18.4>> 15.5>> 12.3 FEN: clears./IV fluids/ miralax  ID: Rocephin/Flagyl DVT: SCDs; he can have chemical DVT prophylaxis from our standpoint   Plan: Clinically he still looks is distended and is uncomfortable as he was when he came in.  WBC and creatinine are both improving.  His original CT was on 03/20/2020.  Continue him on clear liquids, hold the MiraLAX, continue IV fluids and antibiotics.  Echo 10/13/2019: Mild MR/trivial AAS normal EF.    LOS: 2 days    Kirandeep Fariss 03/22/2020 Please see Amion

## 2020-03-23 ENCOUNTER — Inpatient Hospital Stay: Payer: Self-pay

## 2020-03-23 ENCOUNTER — Inpatient Hospital Stay (HOSPITAL_COMMUNITY): Payer: No Typology Code available for payment source | Admitting: Anesthesiology

## 2020-03-23 ENCOUNTER — Encounter (HOSPITAL_COMMUNITY)
Admission: EM | Disposition: A | Discharge status: Home Health | Payer: Self-pay | Source: Home / Self Care | Attending: Family Medicine

## 2020-03-23 ENCOUNTER — Encounter (HOSPITAL_COMMUNITY): Payer: Self-pay | Admitting: Family Medicine

## 2020-03-23 DIAGNOSIS — K631 Perforation of intestine (nontraumatic): Secondary | ICD-10-CM | POA: Diagnosis not present

## 2020-03-23 DIAGNOSIS — N182 Chronic kidney disease, stage 2 (mild): Secondary | ICD-10-CM | POA: Diagnosis not present

## 2020-03-23 DIAGNOSIS — I959 Hypotension, unspecified: Secondary | ICD-10-CM

## 2020-03-23 DIAGNOSIS — I4891 Unspecified atrial fibrillation: Secondary | ICD-10-CM

## 2020-03-23 DIAGNOSIS — I480 Paroxysmal atrial fibrillation: Secondary | ICD-10-CM | POA: Diagnosis not present

## 2020-03-23 DIAGNOSIS — K668 Other specified disorders of peritoneum: Secondary | ICD-10-CM | POA: Diagnosis not present

## 2020-03-23 HISTORY — PX: PARTIAL COLECTOMY: SHX5273

## 2020-03-23 HISTORY — PX: COLOSTOMY: SHX63

## 2020-03-23 HISTORY — PX: LAPAROTOMY: SHX154

## 2020-03-23 LAB — CBC WITH DIFFERENTIAL/PLATELET
Abs Immature Granulocytes: 0.08 10*3/uL — ABNORMAL HIGH (ref 0.00–0.07)
Basophils Absolute: 0 10*3/uL (ref 0.0–0.1)
Basophils Relative: 0 %
Eosinophils Absolute: 0.1 10*3/uL (ref 0.0–0.5)
Eosinophils Relative: 1 %
HCT: 43.4 % (ref 39.0–52.0)
Hemoglobin: 14.2 g/dL (ref 13.0–17.0)
Immature Granulocytes: 1 %
Lymphocytes Relative: 8 %
Lymphs Abs: 0.9 10*3/uL (ref 0.7–4.0)
MCH: 30.7 pg (ref 26.0–34.0)
MCHC: 32.7 g/dL (ref 30.0–36.0)
MCV: 93.9 fL (ref 80.0–100.0)
Monocytes Absolute: 0.9 10*3/uL (ref 0.1–1.0)
Monocytes Relative: 8 %
Neutro Abs: 9.2 10*3/uL — ABNORMAL HIGH (ref 1.7–7.7)
Neutrophils Relative %: 82 %
Platelets: 219 10*3/uL (ref 150–400)
RBC: 4.62 MIL/uL (ref 4.22–5.81)
RDW: 14.5 % (ref 11.5–15.5)
WBC: 11.2 10*3/uL — ABNORMAL HIGH (ref 4.0–10.5)
nRBC: 0 % (ref 0.0–0.2)

## 2020-03-23 LAB — BASIC METABOLIC PANEL
Anion gap: 9 (ref 5–15)
BUN: 24 mg/dL — ABNORMAL HIGH (ref 8–23)
CO2: 25 mmol/L (ref 22–32)
Calcium: 8.6 mg/dL — ABNORMAL LOW (ref 8.9–10.3)
Chloride: 105 mmol/L (ref 98–111)
Creatinine, Ser: 1.38 mg/dL — ABNORMAL HIGH (ref 0.61–1.24)
GFR, Estimated: 56 mL/min — ABNORMAL LOW (ref >60)
Glucose, Bld: 119 mg/dL — ABNORMAL HIGH (ref 70–99)
Potassium: 3.4 mmol/L — ABNORMAL LOW (ref 3.5–5.1)
Sodium: 139 mmol/L (ref 135–145)

## 2020-03-23 LAB — GLUCOSE, CAPILLARY
Glucose-Capillary: 160 mg/dL — ABNORMAL HIGH (ref 70–99)
Glucose-Capillary: 162 mg/dL — ABNORMAL HIGH (ref 70–99)

## 2020-03-23 LAB — HEMOGLOBIN A1C
Hgb A1c MFr Bld: 5.2 % (ref 4.8–5.6)
Mean Plasma Glucose: 102.54 mg/dL

## 2020-03-23 LAB — PREALBUMIN: Prealbumin: 12 mg/dL — ABNORMAL LOW (ref 18–38)

## 2020-03-23 LAB — TYPE AND SCREEN
ABO/RH(D): O POS
Antibody Screen: NEGATIVE

## 2020-03-23 SURGERY — LAPAROTOMY, EXPLORATORY
Anesthesia: General

## 2020-03-23 MED ORDER — MIDAZOLAM HCL 2 MG/2ML IJ SOLN
INTRAMUSCULAR | Status: AC
  Filled 2020-03-23: qty 2

## 2020-03-23 MED ORDER — DIPHENHYDRAMINE HCL 12.5 MG/5ML PO ELIX: 12.5000 mg | ORAL_SOLUTION | Freq: Four times a day (QID) | ORAL | Status: DC | PRN

## 2020-03-23 MED ORDER — METHOCARBAMOL 1000 MG/10ML IJ SOLN
500.0000 mg | Freq: Three times a day (TID) | INTRAVENOUS | Status: DC
  Administered 2020-03-24 – 2020-04-02 (×28): 500 mg via INTRAVENOUS
  Filled 2020-03-23 (×4): qty 500
  Filled 2020-03-23: qty 5
  Filled 2020-03-23 (×3): qty 500
  Filled 2020-03-23: qty 5
  Filled 2020-03-23 (×2): qty 500
  Filled 2020-03-23: qty 5
  Filled 2020-03-23 (×3): qty 500
  Filled 2020-03-23: qty 5
  Filled 2020-03-23 (×13): qty 500
  Filled 2020-03-23: qty 5
  Filled 2020-03-23: qty 500

## 2020-03-23 MED ORDER — AMIODARONE HCL IN DEXTROSE 360-4.14 MG/200ML-% IV SOLN
30.0000 mg/h | INTRAVENOUS | Status: DC
  Administered 2020-03-23 – 2020-03-28 (×11): 30 mg/h via INTRAVENOUS
  Filled 2020-03-23 (×11): qty 200

## 2020-03-23 MED ORDER — PROPOFOL 10 MG/ML IV BOLUS
INTRAVENOUS | Status: AC
  Filled 2020-03-23: qty 20

## 2020-03-23 MED ORDER — SCOPOLAMINE 1 MG/3DAYS TD PT72
1.0000 | MEDICATED_PATCH | TRANSDERMAL | Status: DC
  Administered 2020-03-23: 1.5 mg via TRANSDERMAL
  Filled 2020-03-23: qty 1

## 2020-03-23 MED ORDER — ROCURONIUM BROMIDE 10 MG/ML (PF) SYRINGE
PREFILLED_SYRINGE | INTRAVENOUS | Status: DC | PRN
  Administered 2020-03-23 (×3): 20 mg via INTRAVENOUS
  Administered 2020-03-23: 60 mg via INTRAVENOUS

## 2020-03-23 MED ORDER — ENSURE PRE-SURGERY PO LIQD
296.0000 mL | Freq: Once | ORAL | Status: DC
Start: 2020-03-23 — End: 2020-03-23
  Filled 2020-03-23: qty 296

## 2020-03-23 MED ORDER — METOPROLOL TARTRATE 5 MG/5ML IV SOLN
5.0000 mg | Freq: Once | INTRAVENOUS | Status: AC
  Administered 2020-03-23: 5 mg via INTRAVENOUS
  Filled 2020-03-23: qty 5

## 2020-03-23 MED ORDER — SUCCINYLCHOLINE CHLORIDE 200 MG/10ML IV SOSY
PREFILLED_SYRINGE | INTRAVENOUS | Status: AC
  Filled 2020-03-23: qty 10

## 2020-03-23 MED ORDER — SUCCINYLCHOLINE CHLORIDE 200 MG/10ML IV SOSY
PREFILLED_SYRINGE | INTRAVENOUS | Status: DC | PRN
  Administered 2020-03-23: 140 mg via INTRAVENOUS

## 2020-03-23 MED ORDER — PROPOFOL 10 MG/ML IV BOLUS
INTRAVENOUS | Status: DC | PRN
  Administered 2020-03-23: 150 mg via INTRAVENOUS

## 2020-03-23 MED ORDER — FENTANYL CITRATE (PF) 100 MCG/2ML IJ SOLN
25.0000 ug | INTRAMUSCULAR | Status: DC | PRN
  Administered 2020-03-23 (×2): 50 ug via INTRAVENOUS

## 2020-03-23 MED ORDER — CHLORHEXIDINE GLUCONATE CLOTH 2 % EX PADS: 6.0000 | MEDICATED_PAD | Freq: Once | CUTANEOUS | Status: DC

## 2020-03-23 MED ORDER — FENTANYL CITRATE (PF) 100 MCG/2ML IJ SOLN
INTRAMUSCULAR | Status: AC
  Administered 2020-03-23: 50 ug via INTRAVENOUS
  Filled 2020-03-23: qty 4

## 2020-03-23 MED ORDER — ALBUMIN HUMAN 5 % IV SOLN
INTRAVENOUS | Status: AC
  Filled 2020-03-23: qty 250

## 2020-03-23 MED ORDER — SODIUM CHLORIDE 0.9% FLUSH: 9.0000 mL | INTRAVENOUS | Status: DC | PRN

## 2020-03-23 MED ORDER — AMIODARONE HCL IN DEXTROSE 360-4.14 MG/200ML-% IV SOLN
60.0000 mg/h | INTRAVENOUS | Status: AC
Start: 2020-03-23 — End: 2020-03-23
  Administered 2020-03-23 (×2): 60 mg/h via INTRAVENOUS
  Filled 2020-03-23: qty 200

## 2020-03-23 MED ORDER — ROCURONIUM BROMIDE 10 MG/ML (PF) SYRINGE
PREFILLED_SYRINGE | INTRAVENOUS | Status: AC
  Filled 2020-03-23: qty 10

## 2020-03-23 MED ORDER — DEXAMETHASONE SODIUM PHOSPHATE 4 MG/ML IJ SOLN
INTRAMUSCULAR | Status: DC | PRN
  Administered 2020-03-23: 10 mg via INTRAVENOUS

## 2020-03-23 MED ORDER — CHLORHEXIDINE GLUCONATE 0.12 % MT SOLN
15.0000 mL | Freq: Once | OROMUCOSAL | Status: AC
  Administered 2020-03-23: 15 mL via OROMUCOSAL

## 2020-03-23 MED ORDER — AMIODARONE LOAD VIA INFUSION
150.0000 mg | Freq: Once | INTRAVENOUS | Status: AC
  Administered 2020-03-23: 150 mg via INTRAVENOUS
  Filled 2020-03-23: qty 83.34

## 2020-03-23 MED ORDER — FENTANYL CITRATE (PF) 100 MCG/2ML IJ SOLN
INTRAMUSCULAR | Status: AC
  Filled 2020-03-23: qty 2

## 2020-03-23 MED ORDER — LACTATED RINGERS IV SOLN: INTRAVENOUS | Status: AC

## 2020-03-23 MED ORDER — PHENYLEPHRINE HCL-NACL 10-0.9 MG/250ML-% IV SOLN
INTRAVENOUS | Status: DC | PRN
  Administered 2020-03-23: 50 ug/min via INTRAVENOUS

## 2020-03-23 MED ORDER — SUGAMMADEX SODIUM 500 MG/5ML IV SOLN
INTRAVENOUS | Status: AC
  Filled 2020-03-23: qty 5

## 2020-03-23 MED ORDER — MORPHINE SULFATE 1 MG/ML IV SOLN PCA
INTRAVENOUS | Status: DC
  Administered 2020-03-24: 4.5 mg via INTRAVENOUS
  Administered 2020-03-24: 6 mg via INTRAVENOUS
  Administered 2020-03-24: 10 mg via INTRAVENOUS
  Administered 2020-03-24: 9 mg via INTRAVENOUS
  Administered 2020-03-24: 1.5 mg via INTRAVENOUS
  Administered 2020-03-25: 4.5 mg via INTRAVENOUS
  Administered 2020-03-25: 3 mg via INTRAVENOUS
  Filled 2020-03-23 (×2): qty 30

## 2020-03-23 MED ORDER — LACTATED RINGERS IV SOLN: INTRAVENOUS | Status: DC

## 2020-03-23 MED ORDER — PHENYLEPHRINE 40 MCG/ML (10ML) SYRINGE FOR IV PUSH (FOR BLOOD PRESSURE SUPPORT)
PREFILLED_SYRINGE | INTRAVENOUS | Status: DC | PRN
  Administered 2020-03-23 (×2): 120 ug via INTRAVENOUS

## 2020-03-23 MED ORDER — FENTANYL CITRATE (PF) 100 MCG/2ML IJ SOLN
INTRAMUSCULAR | Status: DC | PRN
  Administered 2020-03-23 (×3): 50 ug via INTRAVENOUS
  Administered 2020-03-23: 100 ug via INTRAVENOUS
  Administered 2020-03-23: 50 ug via INTRAVENOUS

## 2020-03-23 MED ORDER — POTASSIUM CHLORIDE 10 MEQ/100ML IV SOLN
10.0000 meq | INTRAVENOUS | Status: AC
  Administered 2020-03-23 (×4): 10 meq via INTRAVENOUS
  Filled 2020-03-23 (×2): qty 100

## 2020-03-23 MED ORDER — NALOXONE HCL 0.4 MG/ML IJ SOLN: 0.4000 mg | INTRAMUSCULAR | Status: DC | PRN

## 2020-03-23 MED ORDER — OXYCODONE HCL 5 MG PO TABS: 5.0000 mg | ORAL_TABLET | Freq: Once | ORAL | Status: DC | PRN

## 2020-03-23 MED ORDER — GABAPENTIN 300 MG PO CAPS
300.0000 mg | ORAL_CAPSULE | ORAL | Status: AC
  Administered 2020-03-23: 300 mg via ORAL
  Filled 2020-03-23: qty 1

## 2020-03-23 MED ORDER — ALVIMOPAN 12 MG PO CAPS
12.0000 mg | ORAL_CAPSULE | ORAL | Status: AC
Start: 2020-03-23 — End: 2020-03-23
  Administered 2020-03-23: 12 mg via ORAL
  Filled 2020-03-23: qty 1

## 2020-03-23 MED ORDER — DIPHENHYDRAMINE HCL 50 MG/ML IJ SOLN: 12.5000 mg | Freq: Four times a day (QID) | INTRAMUSCULAR | Status: DC | PRN

## 2020-03-23 MED ORDER — ONDANSETRON HCL 4 MG/2ML IJ SOLN: 4.0000 mg | Freq: Four times a day (QID) | INTRAMUSCULAR | Status: DC | PRN

## 2020-03-23 MED ORDER — SUGAMMADEX SODIUM 500 MG/5ML IV SOLN
INTRAVENOUS | Status: DC | PRN
  Administered 2020-03-23: 350 mg via INTRAVENOUS

## 2020-03-23 MED ORDER — CHLORHEXIDINE GLUCONATE CLOTH 2 % EX PADS
6.0000 | MEDICATED_PAD | Freq: Once | CUTANEOUS | Status: AC
  Administered 2020-03-23: 6 via TOPICAL

## 2020-03-23 MED ORDER — ALBUMIN HUMAN 5 % IV SOLN: INTRAVENOUS | Status: DC | PRN

## 2020-03-23 MED ORDER — ENSURE PRE-SURGERY PO LIQD
592.0000 mL | Freq: Once | ORAL | Status: DC
  Filled 2020-03-23: qty 592

## 2020-03-23 MED ORDER — ONDANSETRON HCL 4 MG/2ML IJ SOLN: 4.0000 mg | Freq: Once | INTRAMUSCULAR | Status: DC | PRN

## 2020-03-23 MED ORDER — ACETAMINOPHEN 10 MG/ML IV SOLN
1000.0000 mg | Freq: Four times a day (QID) | INTRAVENOUS | Status: AC
  Administered 2020-03-23 – 2020-03-24 (×4): 1000 mg via INTRAVENOUS
  Filled 2020-03-23 (×4): qty 100

## 2020-03-23 MED ORDER — ACETAMINOPHEN 500 MG PO TABS
1000.0000 mg | ORAL_TABLET | ORAL | Status: AC
Start: 2020-03-23 — End: 2020-03-23
  Administered 2020-03-23: 1000 mg via ORAL
  Filled 2020-03-23: qty 2

## 2020-03-23 MED ORDER — 0.9 % SODIUM CHLORIDE (POUR BTL) OPTIME
TOPICAL | Status: DC | PRN
  Administered 2020-03-23: 3000 mL

## 2020-03-23 MED ORDER — SODIUM CHLORIDE 0.9 % IV SOLN
2.0000 g | INTRAVENOUS | Status: AC
  Administered 2020-03-23: 2 g via INTRAVENOUS
  Filled 2020-03-23: qty 2

## 2020-03-23 MED ORDER — ONDANSETRON HCL 4 MG/2ML IJ SOLN
INTRAMUSCULAR | Status: DC | PRN
  Administered 2020-03-23: 4 mg via INTRAVENOUS

## 2020-03-23 MED ORDER — METOPROLOL TARTRATE 5 MG/5ML IV SOLN
5.0000 mg | Freq: Four times a day (QID) | INTRAVENOUS | Status: DC | PRN
  Administered 2020-03-23 – 2020-03-24 (×2): 5 mg via INTRAVENOUS
  Filled 2020-03-23 (×2): qty 5

## 2020-03-23 MED ORDER — OXYCODONE HCL 5 MG/5ML PO SOLN: 5.0000 mg | Freq: Once | ORAL | Status: DC | PRN

## 2020-03-23 MED ORDER — METOPROLOL TARTRATE 5 MG/5ML IV SOLN: 2.5000 mg | Freq: Four times a day (QID) | INTRAVENOUS | Status: DC | PRN

## 2020-03-23 MED ORDER — SODIUM CHLORIDE 0.9 % IV SOLN
INTRAVENOUS | Status: DC
  Filled 2020-03-23: qty 6

## 2020-03-23 MED ORDER — MIDAZOLAM HCL 5 MG/5ML IJ SOLN
INTRAMUSCULAR | Status: DC | PRN
  Administered 2020-03-23: 2 mg via INTRAVENOUS

## 2020-03-23 MED ORDER — CHLORHEXIDINE GLUCONATE CLOTH 2 % EX PADS
6.0000 | MEDICATED_PAD | Freq: Every day | CUTANEOUS | Status: DC
  Administered 2020-03-24 – 2020-04-02 (×10): 6 via TOPICAL

## 2020-03-23 SURGICAL SUPPLY — 66 items
APL SWBSTK 6 STRL LF DISP (MISCELLANEOUS) ×2
APPLICATOR COTTON TIP 6 STRL (MISCELLANEOUS) ×2 IMPLANT
APPLICATOR COTTON TIP 6IN STRL (MISCELLANEOUS) ×4
BLADE EXTENDED COATED 6.5IN (ELECTRODE) IMPLANT
BLADE HEX COATED 2.75 (ELECTRODE) ×2 IMPLANT
BLADE SURG SZ10 CARB STEEL (BLADE) ×2 IMPLANT
BNDG CMPR 82X61 PLY HI ABS (GAUZE/BANDAGES/DRESSINGS) ×1
BNDG CONFORM 6X.82 1P STRL (GAUZE/BANDAGES/DRESSINGS) ×2 IMPLANT
CLIP VESOCCLUDE LG 6/CT (CLIP) IMPLANT
COVER MAYO STAND STRL (DRAPES) ×2 IMPLANT
COVER SURGICAL LIGHT HANDLE (MISCELLANEOUS) ×2 IMPLANT
COVER WAND RF STERILE (DRAPES) IMPLANT
DRAIN CHANNEL 19F RND (DRAIN) ×2 IMPLANT
DRAPE LAPAROSCOPIC ABDOMINAL (DRAPES) ×2 IMPLANT
DRAPE SHEET LG 3/4 BI-LAMINATE (DRAPES) IMPLANT
DRAPE WARM FLUID 44X44 (DRAPES) ×2 IMPLANT
DRSG OPSITE POSTOP 4X12 (GAUZE/BANDAGES/DRESSINGS) ×2 IMPLANT
DRSG PAD ABDOMINAL 8X10 ST (GAUZE/BANDAGES/DRESSINGS) ×2 IMPLANT
ELECT REM PT RETURN 15FT ADLT (MISCELLANEOUS) ×2 IMPLANT
EVACUATOR SILICONE 100CC (DRAIN) ×2 IMPLANT
GAUZE SPONGE 4X4 12PLY STRL (GAUZE/BANDAGES/DRESSINGS) ×2 IMPLANT
GLOVE INDICATOR 8.0 STRL GRN (GLOVE) ×4 IMPLANT
GLOVE SS BIOGEL STRL SZ 7.5 (GLOVE) ×2 IMPLANT
GLOVE SUPERSENSE BIOGEL SZ 7.5 (GLOVE) ×2
GLOVE SURG LTX SZ8 (GLOVE) ×2 IMPLANT
GLOVE SURG UNDER POLY LF SZ7 (GLOVE) ×2 IMPLANT
GOWN STRL REUS W/TWL LRG LVL3 (GOWN DISPOSABLE) ×2 IMPLANT
GOWN STRL REUS W/TWL XL LVL3 (GOWN DISPOSABLE) ×4 IMPLANT
HANDLE SUCTION POOLE (INSTRUMENTS) ×1 IMPLANT
KIT BASIN OR (CUSTOM PROCEDURE TRAY) ×2 IMPLANT
KIT TURNOVER KIT A (KITS) ×2 IMPLANT
LEGGING LITHOTOMY PAIR STRL (DRAPES) IMPLANT
NS IRRIG 1000ML POUR BTL (IV SOLUTION) ×4 IMPLANT
PACK GENERAL/GYN (CUSTOM PROCEDURE TRAY) ×2 IMPLANT
PENCIL SMOKE EVACUATOR (MISCELLANEOUS) IMPLANT
RELOAD PROXIMATE 75MM BLUE (ENDOMECHANICALS) ×2 IMPLANT
SHEARS FOC LG CVD HARMONIC 17C (MISCELLANEOUS) IMPLANT
SHEARS HARMONIC ACE PLUS 36CM (ENDOMECHANICALS) IMPLANT
SPONGE LAP 18X18 RF (DISPOSABLE) IMPLANT
STAPLER PROXIMATE 75MM BLUE (STAPLE) ×2 IMPLANT
STAPLER VISISTAT 35W (STAPLE) ×2 IMPLANT
SUCTION POOLE HANDLE (INSTRUMENTS) ×2
SUT ETHILON 2 0 PS N (SUTURE) ×2 IMPLANT
SUT NOV 1 T60/GS (SUTURE) IMPLANT
SUT NOVA NAB DX-16 0-1 5-0 T12 (SUTURE) IMPLANT
SUT NOVA T20/GS 25 (SUTURE) IMPLANT
SUT PDS AB 1 CTX 36 (SUTURE) IMPLANT
SUT PDS AB 1 TP1 96 (SUTURE) ×8 IMPLANT
SUT PDS AB 3-0 SH 27 (SUTURE) IMPLANT
SUT PDS AB 4-0 SH 27 (SUTURE) IMPLANT
SUT PROLENE 2 0 BLUE (SUTURE) IMPLANT
SUT PROLENE 2 0 SH DA (SUTURE) ×2 IMPLANT
SUT SILK 2 0 (SUTURE) ×2
SUT SILK 2 0 SH CR/8 (SUTURE) ×2 IMPLANT
SUT SILK 2 0SH CR/8 30 (SUTURE) IMPLANT
SUT SILK 2-0 18XBRD TIE 12 (SUTURE) ×1 IMPLANT
SUT SILK 2-0 30XBRD TIE 12 (SUTURE) IMPLANT
SUT SILK 3 0 (SUTURE) ×4
SUT SILK 3 0 SH CR/8 (SUTURE) ×2 IMPLANT
SUT SILK 3-0 18XBRD TIE 12 (SUTURE) ×2 IMPLANT
SUT VIC AB 2-0 SH 18 (SUTURE) ×2 IMPLANT
SUT VIC AB 3-0 SH 18 (SUTURE) ×6 IMPLANT
TOWEL OR 17X26 10 PK STRL BLUE (TOWEL DISPOSABLE) ×4 IMPLANT
TRAY FOLEY MTR SLVR 16FR STAT (SET/KITS/TRAYS/PACK) ×2 IMPLANT
WATER STERILE IRR 1000ML POUR (IV SOLUTION) IMPLANT
YANKAUER SUCT BULB TIP NO VENT (SUCTIONS) ×2 IMPLANT

## 2020-03-23 NOTE — Transfer of Care (Signed)
Immediate Anesthesia Transfer of Care Note  Patient: Daniel Aguirre  Procedure(s) Performed: EXPLORATORY LAPAROTOMY (N/A ) PARTIAL COLECTOMY (N/A ) COLOSTOMY (N/A )  Patient Location: PACU  Anesthesia Type:General  Level of Consciousness: awake, alert  and oriented  Airway & Oxygen Therapy: Patient Spontanous Breathing and Patient connected to face mask oxygen  Post-op Assessment: Report given to RN and Post -op Vital signs reviewed and stable  Post vital signs: Reviewed and stable  Last Vitals:  Vitals Value Taken Time  BP 152/86 03/23/20 1545  Temp    Pulse 84 03/23/20 1549  Resp 21 03/23/20 1549  SpO2 99 % 03/23/20 1549  Vitals shown include unvalidated device data.  Last Pain:  Vitals:   03/23/20 1217  TempSrc:   PainSc: 0-No pain      Patients Stated Pain Goal: 0 (03/21/20 1028)  Complications: No complications documented.

## 2020-03-23 NOTE — Progress Notes (Signed)
[facility]  PROGRESS NOTE  CESARE SUMLIN ENI:[id number] DOB: [birth year] DOA: 03/19/2020 PCP: Daisy Floro, MD   Brief HPI:   70 year old male with medical history of hypertension, mild renal insufficiency, atrial fibrillation on Eliquis and admission in early January for diverticulitis with abscess presented to ED with complaints of severe abdominal pain.  Patient was managed conservatively 2 months ago for diverticulitis with abscess that was not amenable to IR drainage.  At that time he was discharged on 4 weeks of outpatient antibiotics with Augmentin.  Patient said that he was doing fine and did developed abdominal pain a week ago, he started taking Augmentin which was left over from previous prescription.  Patient waterbrash on his chest abdominal.  He was seen by telehealth provider and was given prednisone 50 mg for 2 days.  After that patient developed left lower quadrant pain and came to ED for further evaluation. In the ED CT scan of the abdomen pelvis showed new pneumoperitoneum, this was due to previously demonstrated sigmoid diverticulitis.  General surgery was consulted.  Patient started on IV antibiotics including ceftriaxone and Flagyl.    Subjective   Patient seen and examined, heart rate went into 130s to 150s this morning.  He is on max dose of 15 mg/h Cardizem gtt.  Blood pressure is low.  Plan for sigmoid colectomy and colostomy today.   Assessment/Plan:     1. Sigmoid diverticulitis with perforation/fistulous tract-patient was diagnosed with diverticulitis with abscess 2 months ago, at that time abscess was not amenable for drainage per IR.  So patient was treated with IV antibiotics and discharged on p.o. Augmentin.  Patient again developed abdominal pain, came to ED and CT abdomen/pelvis showed pneumoperitoneum.  General surgery was consulted, plan was to initially treat with IV antibiotics including ceftriaxone and Flagyl and observe.  However patient is  not progressing well, he continued to have abdominal distention and tenderness, so general surgery has decided to take patient for sigmoid colectomy with colostomy today.  2. Atrial fibrillation with RVR-patient has history of A. fib and is on Eliquis at home.  He went into A. fib with RVR, initially started on IV metoprolol however blood pressure dropped yesterday and heart rate not controlled.  Patient was switched to IV Cardizem gtt. however despite max dose of Cardizem patient is still in A. fib with RVR.  Will consult cardiology for further recommendations.  Heparin GTT was started however it was stopped after patient developed hematochezia.   3. Hematochezia-patient had small amount of bright red blood per rectum.  Heparin was started initially which is currently on hold. 4. Hypotension-we will start LR at 125 mill per hour. 5. Hypertension-blood pressure is soft today, currently on IV Cardizem gtt., metoprolol 2.5 mg IV every 8 hours as needed for BP more than 160/100.  Hydralazine has been discontinued.   6. CKD stage II-creatinine is mildly elevated at 1.38.     COVID-19 Labs  No results for input(s): DDIMER, FERRITIN, LDH, CRP in the last 72 hours.  Lab Results  Component Value Date   SARSCOV2NAA NEGATIVE 03/20/2020   SARSCOV2NAA NEGATIVE 01/23/2020     Scheduled medications:   . Chlorhexidine Gluconate Cloth  6 each Topical Once  . Chlorhexidine Gluconate Cloth  6 each Topical Daily  . clindamycin / gentamicin INTRAPERITONEAL Lavage irrigation   Irrigation To OR  . feeding supplement  296 mL Oral Once  . mouth rinse  15 mL Mouth Rinse BID  . scopolamine  1 patch Transdermal On Call to OR         CBG: No results for input(s): GLUCAP in the last 168 hours.  SpO2: 94 %    CBC: Recent Labs  Lab 03/20/20 0014 03/21/20 0442 03/22/20 0441 03/23/20 0759  WBC 18.4* 15.5* 12.3* 11.2*  NEUTROABS 15.4*  --   --  9.2*  HGB 15.7 14.8 14.7 14.2  HCT 45.9 45.2 44.0 43.4   MCV 90.9 93.4 92.8 93.9  PLT 253 200 202 219    Basic Metabolic Panel: Recent Labs  Lab 03/20/20 0014 03/21/20 0442 03/22/20 0441 03/23/20 0235  NA 139 136 138 139  K 4.2 4.1 3.7 3.4*  CL 105 104 105 105  CO2 23 21* 21* 25  GLUCOSE 145* 125* 127* 119*  BUN 27* 25* 23 24*  CREATININE 1.54* 1.35* 1.20 1.38*  CALCIUM 9.1 8.5* 8.6* 8.6*  MG  --  2.0  --   --      Liver Function Tests: Recent Labs  Lab 03/20/20 0014  AST 16  ALT 25  ALKPHOS 65  BILITOT 1.4*  PROT 7.6  ALBUMIN 4.3     Antibiotics: Anti-infectives (From admission, onward)   Start     Dose/Rate Route Frequency Ordered Stop   03/23/20 1000  clindamycin (CLEOCIN) 900 mg, gentamicin (GARAMYCIN) 240 mg in sodium chloride 0.9 % 1,000 mL for intraperitoneal lavage         Irrigation To Surgery 03/23/20 0724 03/24/20 1000   03/23/20 0815  cefoTEtan (CEFOTAN) 2 g in sodium chloride 0.9 % 100 mL IVPB        2 g 200 mL/hr over 30 Minutes Intravenous On call to O.R. 03/23/20 0724 03/24/20 0559   03/20/20 1400  piperacillin-tazobactam (ZOSYN) IVPB 3.375 g  Status:  Discontinued        3.375 g 12.5 mL/hr over 240 Minutes Intravenous Every 8 hours 03/20/20 0623 03/20/20 0636   03/20/20 1200  cefTRIAXone (ROCEPHIN) 2 g in sodium chloride 0.9 % 100 mL IVPB        2 g 200 mL/hr over 30 Minutes Intravenous Every 24 hours 03/20/20 0636     03/20/20 1200  metroNIDAZOLE (FLAGYL) IVPB 500 mg        500 mg 100 mL/hr over 60 Minutes Intravenous Every 8 hours 03/20/20 0636     03/20/20 0530  piperacillin-tazobactam (ZOSYN) IVPB 3.375 g        3.375 g 100 mL/hr over 30 Minutes Intravenous  Once 03/20/20 0516 03/20/20 0609       DVT prophylaxis: SCDs  Code Status: Full code  Family Communication: No family at bedside   Consultants:  General surgery  Procedures:      Objective   Vitals:   03/23/20 0615 03/23/20 0620 03/23/20 0630 03/23/20 0800  BP:  110/89 109/65   Pulse: (!) 140 (!) 56 (!) 125    Resp: 19 19 15    Temp:    97.9 F (36.6 C)  TempSrc:    Oral  SpO2: 90% 90% 94%   Weight:      Height:        Intake/Output Summary (Last 24 hours) at 03/23/2020 1009 Last data filed at 03/23/2020 0000 Gross per 24 hour  Intake 897.82 ml  Output --  Net 897.82 ml    03/02 1901 - 03/04 0700 In: 1257.8 [P.O.:240; I.V.:518.5] Out: 300 [Urine:300]  Filed Weights   03/19/20 2359 03/20/20 0952  Weight: 81.6 kg 79.4 kg  Physical Examination:    General-appears in no acute distress  Heart-S1-S2, irregular, no murmur auscultated  Lungs-clear to auscultation bilaterally, no wheezing or crackles auscultated  Abdomen-soft, nontender, no organomegaly  Extremities-no edema in the lower extremities  Neuro-alert, oriented x3, no focal deficit noted   Status is: Inpatient  Dispo: The patient is from: Home              Anticipated d/c is to: Home              Anticipated d/c date is: 03/26/2020              Patient currently not stable for discharge  Barrier to discharge-ongoing management for diverticulitis abscess with perforation.        Data Reviewed:   Recent Results (from the past 240 hour(s))  Resp Panel by RT-PCR (Flu A&B, Covid) Nasopharyngeal Swab     Status: None   Collection Time: 03/20/20  5:42 AM   Specimen: Nasopharyngeal Swab; Nasopharyngeal(NP) swabs in vial transport medium  Result Value Ref Range Status   SARS Coronavirus 2 by RT PCR NEGATIVE NEGATIVE Final    Comment: (NOTE) SARS-CoV-2 target nucleic acids are NOT DETECTED.  The SARS-CoV-2 RNA is generally detectable in upper respiratory specimens during the acute phase of infection. The lowest concentration of SARS-CoV-2 viral copies this assay can detect is 138 copies/mL. A negative result does not preclude SARS-Cov-2 infection and should not be used as the sole basis for treatment or other patient management decisions. A negative result may occur with  improper specimen collection/handling,  submission of specimen other than nasopharyngeal swab, presence of viral mutation(s) within the areas targeted by this assay, and inadequate number of viral copies(<138 copies/mL). A negative result must be combined with clinical observations, patient history, and epidemiological information. The expected result is Negative.  Fact Sheet for Patients:  BloggerCourse.comhttps://www.fda.gov/media/152166/download  Fact Sheet for Healthcare Providers:  SeriousBroker.ithttps://www.fda.gov/media/152162/download  This test is no t yet approved or cleared by the [place] FDA and  has been authorized for detection and/or diagnosis of SARS-CoV-2 by FDA under an Emergency Use Authorization (EUA). This EUA will remain  in effect (meaning this test can be used) for the duration of the COVID-19 declaration under Section 564(b)(1) of the Act, 21 U.S.C.section 360bbb-3(b)(1), unless the authorization is terminated  or revoked sooner.       Influenza A by PCR NEGATIVE NEGATIVE Final   Influenza B by PCR NEGATIVE NEGATIVE Final    Comment: (NOTE) The Xpert Xpress SARS-CoV-2/FLU/RSV plus assay is intended as an aid in the diagnosis of influenza from Nasopharyngeal swab specimens and should not be used as a sole basis for treatment. Nasal washings and aspirates are unacceptable for Xpert Xpress SARS-CoV-2/FLU/RSV testing.  Fact Sheet for Patients: BloggerCourse.comhttps://www.fda.gov/media/152166/download  Fact Sheet for Healthcare Providers: SeriousBroker.ithttps://www.fda.gov/media/152162/download  This test is not yet approved or cleared by the [place] FDA and has been authorized for detection and/or diagnosis of SARS-CoV-2 by FDA under an Emergency Use Authorization (EUA). This EUA will remain in effect (meaning this test can be used) for the duration of the COVID-19 declaration under Section 564(b)(1) of the Act, 21 U.S.C. section 360bbb-3(b)(1), unless the authorization is terminated or revoked.  Performed at [facility], [address] [place], [place] [id number]   MRSA PCR Screening     Status: None   Collection Time: 03/20/20  9:39 AM   Specimen: Nasal Mucosa; Nasopharyngeal  Result Value Ref Range Status  MRSA by PCR NEGATIVE NEGATIVE Final    Comment:        The GeneXpert MRSA Assay (FDA approved for NASAL specimens only), is one component of a comprehensive MRSA colonization surveillance program. It is not intended to diagnose MRSA infection nor to guide or monitor treatment for MRSA infections. Performed at [facility], [address] [place], [place] [address]     Recent Labs  Lab 03/20/20 [phone]  LIPASE 37        Nevayah Faust S Caitlynne Harbeck   [facility] If 7PM-7AM, please contact night-coverage at [email or web address], Office  [phone]   03/23/2020, 10:09 AM  LOS: 3 days

## 2020-03-23 NOTE — Anesthesia Preprocedure Evaluation (Addendum)
Anesthesia Evaluation  Patient identified by MRN, date of birth, ID band Patient awake    Reviewed: Allergy & Precautions, NPO status , Patient's Chart, lab work & pertinent test results, reviewed documented beta blocker date and time   Airway Mallampati: II  TM Distance: >3 FB Neck ROM: Full    Dental  (+) Dental Advisory Given, Teeth Intact   Pulmonary neg pulmonary ROS,    Pulmonary exam normal        Cardiovascular hypertension, Pt. on medications and Pt. on home beta blockers + dysrhythmias Atrial Fibrillation  Rhythm:Irregular Rate:Tachycardia  9/21 Echo Left ventricular ejection fraction, by estimation, is 60 to 65%. The left ventricle has normal function. The left ventricle has no regional wall motion abnormalities. There is moderate left ventricular hypertrophy. Left ventricular diastolic parameters are consistent with Grade I diastolic dysfunction (impaired relaxation). 2. Right ventricular systolic function is normal. The right ventricular size is normal.   Neuro/Psych negative neurological ROS  negative psych ROS   GI/Hepatic Neg liver ROS,  Perforated diverticulitis    Endo/Other  negative endocrine ROS  Renal/GU CRFRenal disease     Musculoskeletal negative musculoskeletal ROS (+)   Abdominal   Peds  Hematology  On eliquis     Anesthesia Other Findings Covid test negative   Reproductive/Obstetrics                            Anesthesia Physical Anesthesia Plan  ASA: III  Anesthesia Plan: General   Post-op Pain Management:    Induction: Intravenous and Rapid sequence  PONV Risk Score and Plan: 3 and Treatment may vary due to age or medical condition, Ondansetron, Dexamethasone and Midazolam  Airway Management Planned: Oral ETT  Additional Equipment: None  Intra-op Plan:   Post-operative Plan: Extubation in OR  Informed Consent: I have reviewed the patients  History and Physical, chart, labs and discussed the procedure including the risks, benefits and alternatives for the proposed anesthesia with the patient or authorized representative who has indicated his/her understanding and acceptance.     Dental advisory given  Plan Discussed with: CRNA and Anesthesiologist  Anesthesia Plan Comments:        Anesthesia Quick Evaluation

## 2020-03-23 NOTE — Progress Notes (Signed)
PROGRESS NOTE    [employer] [employer]  ZDG:[id number] DOB: 1950/04/28 DOA: 03/19/2020 PCP: Daisy Floro, MD   Brief Narrative:   Patient is a 70 y.o.male with medical history significant for hypertension, atrial fibrillation on Eliquis, mild renal insufficiency, and diverticulitis. Patient was treated conservatively for diverticulitis 2 months ago, with plans for surgical follow-up for possible sigmoid colectomy.  He had a recent admission in January 2022 for diverticulitis with abscess and C. difficile, not amenable to drainage and was discharged home with a 14 day course ofCeftriaxone and Flagyl. Patient reports taking some "left over Amoxicillin"on 03/10/19 for 1 week and then developedsevere, sharp upper abdominal pain which started on 03/18/20. He contacted a telehealth providerand was treated withprednisone.Subsequently, he developed a severe rash on his chest, abdomen, and groin along with severe RLQ abdominal pain. Patientpresented to the ED on 03/19/20 with continued severe abdominal pain, primarilyRLQ and rash.  ED Course: Afebrile and hemodynamically stable. Pertinent labs include leukocytosis to 18,400 and elevated creatinine at 1.54. CT abdomen reveals new pneumoperitoneum likely secondary to prior sigmoid diverticulitis.There are loops of small bowel in the low midline abdomen that demonstrate wall thickening and hyperenhancement felt to be reactive. Surgery was consulted and treatment started with Zosyn, Dilaudid, and Zofran.  Patient was given hydralazine prn once during the night 03/21/20-03/22/20 and once this morning and reported feeling jittery from it. Hydralazine discontinued and patient started on a Cardizem gtt for hypertension.  Early this morning, patient went into afib with RVR with HR reaching 150 with palpitations while lying in bed.  Patient is currently on a Cardizem gtt at max rate and was given prn metoprolol.  Rate is still not controlled and patient  became hypotensive and was started on continuous IVF's of LR at 125 ml/hr. Cardiology consulted to further evaluate.    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 -Per surgery, due to lack of clinical improvement, colectomy/colostomy scheduled for today. -WOC-FNP provided prep stoma site marking and patient education.  History of A fib on Eliquis (last dose 2/20) /sinus tachycardia/new onset afib with RVR -During hospital course has mostly been in sinus tachycardia with HR 100-120 -Home medications include  25 mg twice daily of metoprolol tartrate along with Eliquis -Pt was on heparin gtt, which was discontinued on 3/3 because of small amount of rectal bleeding which has now resolved. -Patient was on 15 mg/h of IV Cardizem without improvement in the heart rate and went into afib with RVR (rate 150) around 6 am this morning while lying in bed. -Patient was given  5 mg IV metoprolol and subsequently became hypotensive and was started on LR at 125 ml/hr continuous -Cardiology consulted to further evaluate. They recommend starting on amiodarone gtt with goal to wean off diltiazem gtt followed by subsequently weaning off amiodarone gtt after surgery today.             Hematochezia- bright red blood per rectum.   -Heparin was started initially, but stopped due to bright red blood per rectum.   -Patient reports no further episodes of rectal bleeding today.  CKD stage II -Crt 1.54->1.35->1.20->1.38 -Continue to monitor.  Hypertension, labile -Metoprololwas changed to 2.5 mgIVq8hrs on 3/2, which lowered b/p but did not achieve rate control and hydralazine IV  10 mg q4 hrs, prn for  B/P > 160/100 was added yesterday -Patient was given hydralazine prn, twice on 03/21/20-03/22/20 and reported feeling jittery from it.  -3/3 Hydralazine discontinued, metoprolol was changed to 2.5 mg IV q8 hrs prn. -3/4 patient went into afib with RVR with HR reaching 150 with palpitations while lying in bed, and patient became hypotensive after prn metoprolol. -Start IVF's, LR at 125 ml/hr continuous -Cardiology consulted to further evaluate. They recommend starting on amiodarone gtt with goal to wean off diltiazem gtt followed by subsequently weaning off amiodarone gtt after surgery today.   DVT prophylaxis: SCD's Code Status: Full Family Communication:  No family at bedside  Status is: Inpatient  Dispo: The patient is from: Home              Anticipated d/c is to: Home              Patient currently is not medically stable for discharge   Difficult to place patient: No   Body mass index is 26.61 kg/m.    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  He also reports that he has had no further episodes of blood in his stool.  He does have a complaint of having heart palpitations with episodes of HR reaching 150 while lying in bed.  Examination:  General exam: Appears calm and comfortable  Respiratory system: Clear to auscultation. Respiratory effort normal. Cardiovascular system: S1 & S2 heard, irregular and rapid rate. No murmur ausculated.  No pedal edema. Gastrointestinal system: Abdomen is distended, soft with mild tenderness, unchanged from yesterday. No organomegaly or masses felt. Normal bowel sounds heard. Central nervous system: Alert and oriented. No focal neurological deficits. Extremities: Symmetric 5 x 5 power. Skin: No rashes, lesions or ulcers Psychiatry: Judgement and insight appear normal.  Mood & affect appropriate.     Objective: Vitals:   03/23/20 0615 03/23/20 0620 03/23/20 0630 03/23/20 0800  BP:  110/89 109/65   Pulse: (!) 140 (!) 56 (!) 125   Resp: 19 19 15    Temp:    97.9 F (36.6 C)  TempSrc:    Oral  SpO2: 90% 90% 94%   Weight:      Height:        Intake/Output Summary (Last 24 hours) at 03/23/2020 0946 Last data filed at 03/23/2020 0000 Gross per 24 hour  Intake 897.82 ml  Output -  Net 897.82 ml   Filed Weights   03/19/20 2359 03/20/20 0952  Weight: 81.6 kg 79.4 kg     Data Reviewed:   CBC: Recent Labs  Lab 03/20/20 0014 03/21/20 0442 03/22/20 0441 03/23/20 0759  WBC 18.4* 15.5* 12.3* 11.2*  NEUTROABS 15.4*  --   --  9.2*  HGB 15.7 14.8 14.7 14.2  HCT 45.9 45.2 44.0 43.4  MCV 90.9 93.4 92.8 93.9  PLT 253 200 202 219   Basic Metabolic Panel: Recent Labs  Lab 03/20/20 0014 03/21/20 0442 03/22/20 0441 03/23/20 0235  NA 139 136 138 139  K 4.2 4.1 3.7 3.4*  CL 105 104 105 105  CO2 23 21* 21* 25  GLUCOSE 145* 125* 127* 119*  BUN 27* 25* 23 24*  CREATININE 1.54* 1.35* 1.20 1.38*  CALCIUM 9.1 8.5* 8.6* 8.6*  MG  --  2.0  --   --  GFR: Estimated Creatinine Clearance: 50.3 mL/min (A) (by C-G formula based on SCr of 1.38 mg/dL (H)). Liver Function Tests: Recent Labs  Lab 03/20/20 0014  AST 16  ALT 25  ALKPHOS 65  BILITOT 1.4*  PROT 7.6  ALBUMIN 4.3   Recent Labs  Lab 03/20/20 0014  LIPASE 37   No results for input(s): AMMONIA in the last 168 hours. Coagulation Profile: Recent Labs  Lab 03/20/20 1328  INR 1.2   Cardiac Enzymes: No results for input(s): CKTOTAL, CKMB, CKMBINDEX, TROPONINI in the last 168 hours. BNP (last 3 results) No results for input(s): PROBNP in the last 8760 hours. HbA1C: No results for input(s): HGBA1C in the last 72 hours. CBG: No results for input(s): GLUCAP in the last 168 hours. Lipid Profile: No results for input(s): CHOL, HDL, LDLCALC, TRIG, CHOLHDL, LDLDIRECT in the last 72  hours. Thyroid Function Tests: No results for input(s): TSH, T4TOTAL, FREET4, T3FREE, THYROIDAB in the last 72 hours. Anemia Panel: No results for input(s): VITAMINB12, FOLATE, FERRITIN, TIBC, IRON, RETICCTPCT in the last 72 hours. Sepsis Labs: No results for input(s): PROCALCITON, LATICACIDVEN in the last 168 hours.  Recent Results (from the past 240 hour(s))  Resp Panel by RT-PCR (Flu A&B, Covid) Nasopharyngeal Swab     Status: None   Collection Time: 03/20/20  5:42 AM   Specimen: Nasopharyngeal Swab; Nasopharyngeal(NP) swabs in vial transport medium  Result Value Ref Range Status   SARS Coronavirus 2 by RT PCR NEGATIVE NEGATIVE Final    Comment: (NOTE) SARS-CoV-2 target nucleic acids are NOT DETECTED.  The SARS-CoV-2 RNA is generally detectable in upper respiratory specimens during the acute phase of infection. The lowest concentration of SARS-CoV-2 viral copies this assay can detect is 138 copies/mL. A negative result does not preclude SARS-Cov-2 infection and should not be used as the sole basis for treatment or other patient management decisions. A negative result may occur with  improper specimen collection/handling, submission of specimen other than nasopharyngeal swab, presence of viral mutation(s) within the areas targeted by this assay, and inadequate number of viral copies(<138 copies/mL). A negative result must be combined with clinical observations, patient history, and epidemiological information. The expected result is Negative.  Fact Sheet for Patients:  BloggerCourse.com  Fact Sheet for Healthcare Providers:  SeriousBroker.it  This test is no t yet approved or cleared by the [place] FDA and  has been authorized for detection and/or diagnosis of SARS-CoV-2 by FDA under an Emergency Use Authorization (EUA). This EUA will remain  in effect (meaning this test can be used) for the duration of the COVID-19  declaration under Section 564(b)(1) of the Act, 21 U.S.C.section 360bbb-3(b)(1), unless the authorization is terminated  or revoked sooner.       Influenza A by PCR NEGATIVE NEGATIVE Final   Influenza B by PCR NEGATIVE NEGATIVE Final    Comment: (NOTE) The Xpert Xpress SARS-CoV-2/FLU/RSV plus assay is intended as an aid in the diagnosis of influenza from Nasopharyngeal swab specimens and should not be used as a sole basis for treatment. Nasal washings and aspirates are unacceptable for Xpert Xpress SARS-CoV-2/FLU/RSV testing.  Fact Sheet for Patients: BloggerCourse.com  Fact Sheet for Healthcare Providers: SeriousBroker.it  This test is not yet approved or cleared by the [place] FDA and has been authorized for detection and/or diagnosis of SARS-CoV-2 by FDA under an Emergency Use Authorization (EUA). This EUA will remain in effect (meaning this test can be used) for the duration of the COVID-19 declaration under Section  564(b)(1) of the Act, 21 U.S.C. section 360bbb-3(b)(1), unless the authorization is terminated or revoked.  Performed at [facility], [address] [place], [place] [address]   MRSA PCR Screening     Status: None   Collection Time: 03/20/20  9:39 AM   Specimen: Nasal Mucosa; Nasopharyngeal  Result Value Ref Range Status   MRSA by PCR NEGATIVE NEGATIVE Final    Comment:        The GeneXpert MRSA Assay (FDA approved for NASAL specimens only), is one component of a comprehensive MRSA colonization surveillance program. It is not intended to diagnose MRSA infection nor to guide or monitor treatment for MRSA infections. Performed at [facility], [address] [place], [place] [address]          Radiology Studies: No results found.      Scheduled Meds: . alvimopan  12 mg Oral On Call to OR  . Chlorhexidine Gluconate Cloth  6 each Topical Once  .  Chlorhexidine Gluconate Cloth  6 each Topical Daily  . clindamycin / gentamicin INTRAPERITONEAL Lavage irrigation   Irrigation To OR  . feeding supplement  296 mL Oral Once  . mouth rinse  15 mL Mouth Rinse BID  . scopolamine  1 patch Transdermal On Call to OR   Continuous Infusions: . cefoTEtan (CEFOTAN) IV    . cefTRIAXone (ROCEPHIN)  IV Stopped (03/22/20 1241)  . diltiazem (CARDIZEM) infusion 15 mg/hr (03/23/20 0244)  . lactated ringers 125 mL/hr at 03/23/20 0830  . metronidazole Stopped (03/23/20 0602)     LOS: 3 days   Time spent= 35 mins   Shearon BaloLisa P Gertrude Tarbet, RN, NP-Student   If 7PM-7AM, please contact night-coverage  03/23/2020, 9:46 AM

## 2020-03-23 NOTE — Consult Note (Addendum)
Cardiology Consultation:   Patient ID: Daniel Aguirre MRN: [id number]; DOB: 23-Jul-1950  Admit date: 03/19/2020 Date of Consult: 03/23/2020  PCP:  Daisy Floro, MD   [facility]  Cardiologist:  Rollene Rotunda, MD  Advanced Practice Provider:  No care team member to display Electrophysiologist:  None    Patient Profile:   Daniel Aguirre is a 70 y.o. male with a hx of  hypertension, PAF, aortic insufficiency, right bundle branch block with left anterior fascicular block and CKD stage III who is being seen today for the evaluation of afib with RVR at the request of Dr. Sharl Ma.  History of Present Illness:   Mr. Daniel Aguirre is a 70 year old male with past medical history of hypertension, PAF, aortic insufficiency, right bundle branch block with left anterior fascicular block and CKD stage III.  Myoview obtained on 12/29/2017 showed EF 61%, no ischemia or infarction.  Last echocardiogram obtained on 10/13/2019 showed EF 60 to 65%, grade 1 DD, trivial MR, mild to moderate AI, borderline dilatation of ascending aorta measuring 39 mm.  Patient was previously admitted on 01/22/2020 for diverticulitis of the large intestine with abscess.  Unfortunately his abscess was not amenable to drainage.  He was subsequently discharged on antibiotic therapy with plan to follow-up with surgery as outpatient for possible sigmoidectomy.  He was cleared for the procedure by cardiology service without further work-up.  For the past 2 weeks, he has been having worsening left lower quadrant abdominal pain prompting the patient to seek medical attention again on 03/20/2020.  CT demonstrated new pneumoperitoneum secondary to possible perforation of sigmoid diverticulitis.  He was admitted and was treated with Zosyn, Dilaudid and Zofran.  He was seen by general surgery service, initially medical management was recommended, however due to persistent symptoms, patient is planning to undergo exploratory  laparotomy with possible sigmoid colectomy with colostomy today.  Around 5:38 AM in the morning of 03/24/2018, he went into atrial fibrillation with RVR.  Last dose of Eliquis was on 2/28 prior to coming to the hospital.  For the past few days, he has been in sinus tachycardia with heart rate between 100-120s.  After he went into atrial fibrillation with RVR, he was placed on Cardizem drip, currently running at 15 mg/hr, heart rate continue to be elevated despite maximum dose of IV Cardizem.  He did receive a single dose of IV Lopressor this morning.  Blood pressure is borderline in the high 90s to low 100s systolic.   Past Medical History:  Diagnosis Date  . CKD (chronic kidney disease), stage III (HCC)   . Diastolic dysfunction    a. 09/2015 Echo: EF 65-70%, no rwma, Gr1 DD, Mod AI. Asc AO 50mm. Mildly dil Asc Ao.  LA 57mm.  Marland Kitchen Dyslipidemia   . Hypertension   . Kidney stones   . Moderate aortic insufficiency    a. 09/2015 Echo: Mod AI.  Marland Kitchen Right bundle branch block   . Testicular hypofunction     Past Surgical History:  Procedure Laterality Date  . CHOLECYSTECTOMY    . MOHS SURGERY       Home Medications:  Prior to Admission medications   Medication Sig Start Date End Date Taking? Authorizing Provider  acetaminophen (TYLENOL) 650 MG CR tablet Take 1,300 mg by mouth every 8 (eight) hours as needed for fever or pain.   Yes [provider]  Cholecalciferol (VITAMIN D3) 125 MCG (5000 UT) CAPS Take 5,000 Units by mouth daily. 04/21/19  Yes [provider]  diltiazem (CARDIZEM CD) 240 MG 24 hr capsule Take 1 capsule (240 mg total) by mouth daily. 09/29/19  Yes Hochrein, Fayrene Fearing, MD  ELIQUIS 5 MG TABS tablet TAKE 1 TABLET BY MOUTH TWICE A DAY Patient taking differently: Take 5 mg by mouth 2 (two) times daily. 04/12/18  Yes Rollene Rotunda, MD  EPINEPHrine 0.3 mg/0.3 mL IJ SOAJ injection Inject 0.3 mLs into the muscle once as needed for anaphylaxis. 03/18/20  Yes [provider]   fluticasone (CUTIVATE) 0.005 % ointment Apply 1 application topically 2 (two) times daily as needed (rash/dry skin).   Yes [provider]  metoprolol tartrate (LOPRESSOR) 25 MG tablet Take 25 mg by mouth 2 (two) times daily.   Yes [provider]  predniSONE (DELTASONE) 50 MG tablet Take 50 mg by mouth daily. 5 day supply 03/18/20  Yes [provider]  psyllium (METAMUCIL) 58.6 % powder Take 2 packets by mouth daily. 2 tablespoons daily   Yes [provider]  tamsulosin (FLOMAX) 0.4 MG CAPS capsule Take 0.4 mg by mouth daily. 10/04/19  Yes [provider]  testosterone (ANDROGEL) 50 MG/5GM (1%) GEL Place 2.5-5 g onto the skin daily. 5mg  on Monday Wednesday Friday 2.5mg  on Tuesday thursdsay Saturday sunday 04/08/18  Yes [provider]  amoxicillin-clavulanate (AUGMENTIN) 875-125 MG tablet Take 1 tablet by mouth 2 (two) times daily. 02/17/20   02/19/20, MD    Inpatient Medications: Scheduled Meds: . Chlorhexidine Gluconate Cloth  6 each Topical Once  . Chlorhexidine Gluconate Cloth  6 each Topical Daily  . clindamycin / gentamicin INTRAPERITONEAL Lavage irrigation   Irrigation To OR  . feeding supplement  296 mL Oral Once  . mouth rinse  15 mL Mouth Rinse BID  . scopolamine  1 patch Transdermal On Call to OR   Continuous Infusions: . cefoTEtan (CEFOTAN) IV    . cefTRIAXone (ROCEPHIN)  IV Stopped (03/22/20 1241)  . diltiazem (CARDIZEM) infusion 15 mg/hr (03/23/20 0244)  . lactated ringers 125 mL/hr at 03/23/20 0830  . metronidazole Stopped (03/23/20 0602)   PRN Meds: acetaminophen **OR** acetaminophen, HYDROmorphone (DILAUDID) injection, metoprolol tartrate, ondansetron (ZOFRAN) IV  Allergies:    Allergies  Allergen Reactions  . Amlodipine Palpitations  . Lisinopril Other (See Comments)    Elevated  creatinine     Social History:   Social History   Socioeconomic History  . Marital status: Married    Spouse name: Not on  file  . Number of children: 2  . Years of education: Not on file  . Highest education level: Not on file  Occupational History  . Occupation: 05/23/20 (retired)  Tobacco Use  . Smoking status: Never Smoker  . Smokeless tobacco: Never Used  Vaping Use  . Vaping Use: Never used  Substance and Sexual Activity  . Alcohol use: Yes  . Drug use: Never  . Sexual activity: Yes  Other Topics Concern  . Not on file  Social History Narrative  . Not on file   Social Determinants of Health   Financial Resource Strain: Not on file  Food Insecurity: Not on file  Transportation Needs: Not on file  Physical Activity: Not on file  Stress: Not on file  Social Connections: Not on file  Intimate Partner Violence: Not on file    Family History:    Family History  Problem Relation Age of Onset  . Colon cancer Mother 59  . AAA (abdominal aortic aneurysm) Father 44  .  Stroke Father   . Heart attack Brother 72       presumed MI - SCD  . Stroke Brother 80     ROS:  Please see the history of present illness.   All other ROS reviewed and negative.     Physical Exam/Data:   Vitals:   03/23/20 0615 03/23/20 0620 03/23/20 0630 03/23/20 0800  BP:  110/89 109/65   Pulse: (!) 140 (!) 56 (!) 125   Resp: Temp:    97.9 F (36.6 C)  TempSrc:    Oral  SpO2: 90% 90% 94%   Weight:      Height:        Intake/Output Summary (Last 24 hours) at 03/23/2020 1039 Last data filed at 03/23/2020 0000 Gross per 24 hour  Intake 897.82 ml  Output --  Net 897.82 ml   Last 3 Weights 03/20/2020 03/19/2020 02/17/2020  Weight (lbs) 175 lb 180 lb 181 lb  Weight (kg) 79.379 kg 81.647 kg 82.101 kg     Body mass index is 26.61 kg/m.  General:  Well nourished, well developed, in no acute distress HEENT: normal Lymph: no adenopathy Neck: no JVD Endocrine:  No thryomegaly Vascular: No carotid bruits; FA pulses 2+ bilaterally without bruits  Cardiac: Irregularly irregular; 3/6 murmur Lungs:  clear to  auscultation bilaterally, no wheezing, rhonchi or rales  Abd: soft, nontender, no hepatomegaly  Ext: no edema Musculoskeletal:  No deformities, BUE and BLE strength normal and equal Skin: warm and dry  Neuro:  CNs 2-12 intact, no focal abnormalities noted Psych:  Normal affect   EKG:  The EKG was personally reviewed and demonstrates:  Sinus tachycardia, RBBB with LAFB Telemetry:  Telemetry was personally reviewed and demonstrates: Sinus tachycardia, converted into atrial fibrillation around 5:38 AM this morning  Relevant CV Studies:  Echo 10/13/2019 1. Left ventricular ejection fraction, by estimation, is 60 to 65%. The  left ventricle has normal function. The left ventricle has no regional  wall motion abnormalities. There is moderate left ventricular hypertrophy.  Left ventricular diastolic  parameters are consistent with Grade I diastolic dysfunction (impaired  relaxation).  2. Right ventricular systolic function is normal. The right ventricular  size is normal.  3. The mitral valve is normal in structure. Trivial mitral valve  regurgitation. No evidence of mitral stenosis.  4. The aortic valve is tricuspid. There is mild calcification of the  aortic valve. There is mild thickening of the aortic valve. Aortic valve  regurgitation is mild to moderate. Mild aortic valve sclerosis is present,  with no evidence of aortic valve  stenosis.  5. There is borderline dilatation of the ascending aorta, measuring 39  mm.  6. The inferior vena cava is normal in size with greater than 50%  respiratory variability, suggesting right atrial pressure of 3 mmHg.   Comparison(s): A prior study was performed on 12/21/2017. No significant  change from prior study. Prior images reviewed side by side. Aortic valve  regurgitation visually similar. LV function unchanged.   Laboratory Data:  High Sensitivity Troponin:  No results for input(s): TROPONINIHS in the last 720 hours.    Chemistry Recent Labs  Lab 03/21/20 0442 03/22/20 0441 03/23/20 0235  NA 136 138 139  K 4.1 3.7 3.4*  CL 104 105 105  CO2 21* 21* 25  GLUCOSE 125* 127* 119*  BUN 25* 23 24*  CREATININE 1.35* 1.20 1.38*  CALCIUM 8.5* 8.6* 8.6*  GFRNONAA 58* >60 56*  ANIONGAP  11 12 9     Recent Labs  Lab 03/20/20 0014  PROT 7.6  ALBUMIN 4.3  AST 16  ALT 25  ALKPHOS 65  BILITOT 1.4*   Hematology Recent Labs  Lab 03/21/20 0442 03/22/20 0441 03/23/20 0759  WBC 15.5* 12.3* 11.2*  RBC 4.84 4.74 4.62  HGB 14.8 14.7 14.2  HCT 45.2 44.0 43.4  MCV 93.4 92.8 93.9  MCH 30.6 31.0 30.7  MCHC 32.7 33.4 32.7  RDW 14.7 14.3 14.5  PLT 200 202 219   BNPNo results for input(s): BNP, PROBNP in the last 168 hours.  DDimer No results for input(s): DDIMER in the last 168 hours.   Radiology/Studies:  CT ABDOMEN PELVIS W CONTRAST  Result Date: 03/20/2020 CLINICAL DATA:  Abdominal pain EXAM: CT ABDOMEN AND PELVIS WITH CONTRAST TECHNIQUE: Multidetector CT imaging of the abdomen and pelvis was performed using the standard protocol following bolus administration of intravenous contrast. CONTRAST:  80mL OMNIPAQUE IOHEXOL 300 MG/ML  SOLN COMPARISON:  February 20, 2020 FINDINGS: Lower chest: The lung bases are clear. The heart size is normal. Hepatobiliary: The liver is normal. Status post cholecystectomy.There is no biliary ductal dilation. Pancreas: Normal contours without ductal dilatation. No peripancreatic fluid collection. Spleen: Unremarkable. Adrenals/Urinary Tract: --Adrenal glands: Unremarkable. --Right kidney/ureter: No hydronephrosis or radiopaque kidney stones. --Left kidney/ureter: No hydronephrosis or radiopaque kidney stones. --Urinary bladder: Unremarkable. Stomach/Bowel: --Stomach/Duodenum: No hiatal hernia or other gastric abnormality. Normal duodenal course and caliber. --Small bowel: There is diffuse circumferential wall thickening of multiple small bowel loops in the low midline abdomen. There  are adjacent pockets of gas and free fluid. There is associated wall thickening of the nearby small bowel. There is adjacent fat stranding. --Colon: Again noted are colonic diverticula following the sigmoid colon. There is an apparent fistulous tract coursing superiorly from the sigmoid colon towards pockets of gas and fluid in the mid abdomen. --Appendix: Normal. Vascular/Lymphatic: Normal course and caliber of the major abdominal vessels. --No retroperitoneal lymphadenopathy. --No mesenteric lymphadenopathy. --No pelvic or inguinal lymphadenopathy. Reproductive: Unremarkable Other: There is new free air scattered throughout the abdomen. The abdominal wall is normal. Musculoskeletal. No acute displaced fractures. IMPRESSION: New pneumoperitoneum. This is felt to be secondary to the previously demonstrated sigmoid diverticulitis. However, there are loops of small bowel in the low midline abdomen that demonstrate wall thickening and hyperenhancement. While these are felt to be reactive, they may also be the source of free air. These results will be called to the ordering clinician or representative by the Radiologist Assistant, and communication documented in the PACS or [employer] Dashboard. Electronically Signed   By: Katherine Mantlehristopher  Green M.D.   On: 03/20/2020 03:53     Assessment and Plan:   1. Paroxysmal atrial fibrillation with RVR:  -He takes 25 mg twice daily of metoprolol tartrate along with Eliquis at home.  -Currently on 15 mg/h of IV Cardizem without improvement in the heart rate.   -Although other note mentions atrial fibrillation with RVR yesterday, however I do not see this on the telemetry.  -It appears patient has been in sinus tachycardia with heart rate between 100-120s for the past 2 days and went into atrial fibrillation with RVR around 5:38 AM this morning.  A. fib exacerbated by the diverticulitis with perforation.  -Last dose of Eliquis was on 2/20.  He has been of IV heparin.  IV heparin  was just held yesterday afternoon around 2 PM anticipation of surgery today.  -We will discuss with MD, since atrial fibrillation  started this morning, would recommend start on IV amiodarone with him going through surgery for better rate control and avoid significant hypotension.  This way it will allow [place] to cut back on the IV diltiazem given the current borderline hypotension.  Then we can wean him off of amiodarone once he recovers from surgery.  2. Recurrent diverticulitis with perforation: Seen on CT of abdomen pelvis.  Plan for exploratory laparotomy with possible sigmoid colectomy with colostomy today.  Surgery is around 1 PM today.  3. Mild to moderate AI: Stable on last echocardiogram in September 2021.  4. Right bundle branch block with left anterior fascicular block: We will need to monitor for advanced conduction disease  5. Hypertension: Currently hypotensive.   Risk Assessment/Risk Scores:          CHA2DS2-VASc Score = 2  This indicates a 2.2% annual risk of stroke. The patient's score is based upon: CHF History: No HTN History: Yes Diabetes History: No Stroke History: No Vascular Disease History: No Age Score: 1 Gender Score: 0         For questions or updates, please contact [facility] Please consult [email or web address] for contact info under    Ramond Dial, [place]  03/23/2020 10:39 AM

## 2020-03-23 NOTE — Anesthesia Procedure Notes (Signed)
Procedure Name: Intubation Date/Time: 03/23/2020 1:11 PM Performed by: Vanessa Ripon, CRNA Pre-anesthesia Checklist: Patient identified, Emergency Drugs available, Suction available and Patient being monitored Patient Re-evaluated:Patient Re-evaluated prior to induction Oxygen Delivery Method: Circle system utilized Preoxygenation: Pre-oxygenation with 100% oxygen Induction Type: IV induction, Rapid sequence and Cricoid Pressure applied Laryngoscope Size: 2 and Miller Grade View: Grade I Tube type: Oral Tube size: 7.0 mm Number of attempts: 1 Airway Equipment and Method: Stylet Placement Confirmation: ETT inserted through vocal cords under direct vision,  positive ETCO2 and breath sounds checked- equal and bilateral Secured at: 21 cm Tube secured with: Tape Dental Injury: Teeth and Oropharynx as per pre-operative assessment

## 2020-03-23 NOTE — Op Note (Addendum)
Preoperative diagnosis: Complex diverticulitis with microperforation failure  to improve on  medical management  Postoperative diagnosis: Complex Hinchey 3 diverticulitis with interloop abscesses and pelvic abscess and small bowel obstruction  Procedure: Sigmoid colectomy and colostomy and Hartman's pouch formation    Surgeon: Erroll Luna, MD  Assistant: Melina Modena PA    Anesthesia: General  EBL: 100 cc  Drains: 19 round drain to pelvis  Specimen: Sigmoid: To pathology  IV fluids: Per anesthesia record  Indications for procedure: The patient is a 70 year old male who has had issues with diverticulitis.  He was seen in the hospital few months ago and treated medically improved.  He has been followed by Dr. Dema Severin with the hope that he would progress on to elective surgery.  Last week he developed acute abdominal pain return to the emergency room.  He is found to have dots of free air and recurrent diverticulitis.  He has been managed for last 4 days with IV antibiotics but his condition has not improved and he has become more distended.  He is also had more nausea vomiting.  Since his symptoms have not improved I recommended sigmoid colectomy with colostomy this point time since he has failed maximal medical management.The procedure has been discussed with the patient.  Alternative therapies have been discussed with the patient.  Operative risks include bleeding,  Infection,  Organ injury, wound infection, colostomy complications, injury to internal structures, injury to ureter, injury to bladder, injury to intestine, nerve injury,  Blood vessel injury,  DVT,  Pulmonary embolism,  Death,  And possible reoperation.  Medical management risks include worsening of present situation.  The success of the procedure is 50 -90 % at treating patients symptoms.  The patient understands and agrees to proceed.     Description of procedure: The patient was met in the holding area and the procedure  was reviewed.  We discussed surgery and ostomy formation..  We discussed the rationale for it and the hope will be reconstitution of the GI tract down the road once inflammation has subsided.  He was taken back to the operating.  He was placed supine upon the OR table.  After induction of general esthesia, a Foley catheter was placed and orogastric tube was placed.  The abdomen was prepped and draped in a sterile fashion.  He was already on antibiotics.  Timeout performed to verify proper patient and procedure.  Midline incision was used just above the umbilicus down to the pubic symphysis.  Dissection was carried down to the subcutaneous fat until the linea alba was identified and opened in the midline.  Retractors were placed.  He had a significant amount of turbid fluid but no frank fecal peritonitis was noted.  He did have elements of a bowel obstruction.  Retractor placed.  Small bowel was run from the ligament of Treitz down to the ileocecal valve.  There were multiple interloop abscesses that were more mobilized and drained.  The small bowel was tethered in his pelvis on adjacent to the sigmoid colon that was inflamed.  This causing a bowel obstruction.  This was released.  Small bowel was then run retrograde.  There is no evidence of any full-thickness injury.  There was some inflammatory rind at 3 separate areas where interloop abscesses were contained which was adjacent to the sigmoid colon.  Retractors were adjusted.  Laps were placed to retract the small bowel out of the operative field to examine the colon.  The sigmoid colon was densely inflamed  with elements of acute on chronic cholecystitis.  The left colon was mobilized along the white line of Toldt.  There was significant retroperitoneal edema.  Dissection was carried down to encountered the distal sigmoid colon and rectosigmoid junction.  Colon was mobilized from the retroperitoneum and the left ureter was identified.  It was preserved.  Once was  identified we divided the bowel proximal to the inflammation with a GIA 75 stapler.  We then found a soft spot distal and divided there.  The mesentery was taken down with LigaSure and 3-0 Vicryl ties.  The specimen was removed.  Irrigation was used in the pelvis.  Bleeding was controlled.  A single stitch of 2-0 Prolene was used to tack the distal colon rectal stump to the anterior peritoneum with hope that he could be reconstructed down the road.  After irrigation was placed it was suctioned out.  We used about 3 L.  We then ran the small bowel again to reexamine the small bowel and saw no evidence of full-thickness injury except for the rind that was documented previously.  At this point time a left upper quadrant colostomy was made at the spot where the wound ostomy care nurse marked the patient.  A circular incision was made at the spot the skin.  Dissection was carried down to the rectus muscle identified.  A cruciate incision was made in the anterior sheath the muscles were split.  Posterior sheath was opened with cautery.  The colon was delivered through this.  The mesentery was quite short and in the colon was quite thickened and edematous.  He was able to pull through.  Babcocks were placed to hold it in place.  Sponge and instrument counts were done which were correct.  The wand was used in waves which showed no evidence of retained sponge.  Fascia closed with running PDS of 10.  Once the fascia was closed the skin was packed with saline soaked gauze.  Colostomy matured with 3-0 Vicryl.  All counts were found to be correct x2.  Ostomy appliance placed.  Dry dressings applied.  The patient was in extubated taken to recovery in satisfactory condition.

## 2020-03-23 NOTE — Anesthesia Postprocedure Evaluation (Signed)
Anesthesia Post Note  Patient: Daniel Aguirre  Procedure(s) Performed: EXPLORATORY LAPAROTOMY (N/A ) PARTIAL COLECTOMY (N/A ) COLOSTOMY (N/A )     Patient location during evaluation: PACU Anesthesia Type: General Level of consciousness: awake and alert Pain management: pain level controlled Vital Signs Assessment: post-procedure vital signs reviewed and stable Respiratory status: spontaneous breathing, nonlabored ventilation, respiratory function stable and patient connected to nasal cannula oxygen Cardiovascular status: blood pressure returned to baseline and stable Postop Assessment: no apparent nausea or vomiting Anesthetic complications: no   No complications documented.  Last Vitals:  Vitals:   03/23/20 1630 03/23/20 1645  BP: (!) 153/85 136/88  Pulse: 84 87  Resp: 11 16  Temp:    SpO2: 94% 92%    Last Pain:  Vitals:   03/23/20 1543  TempSrc:   PainSc: Asleep                 Beryle Lathe

## 2020-03-23 NOTE — Progress Notes (Addendum)
PHARMACY - TOTAL PARENTERAL NUTRITION CONSULT NOTE   Indication:   "over 5 days with no PO intake. surgery today 3/4. anticipate prolonged ileus"  Patient Measurements: Height: 5\' 8"  (172.7 cm) Weight: 79.4 kg (175 lb 0.7 oz) IBW/kg (Calculated) : 68.4 TPN AdjBW (KG): 79.4 Body mass index is 26.62 kg/m.  Assessment:  70 y/o M admitted with continued severe abdominal pain from recurrent sigmoid diverticulitis with perforation/fistulous tract. Patient was initially treated medically but decision was made for surgical intervention. Patient is now s/p sigmoid colectomy and colostomy with Hartman's pouch.   Glucose / Insulin:  Electrolytes: K 3.4 on amiodarone Renal:  Hepatic:  Intake / Output; MIVF:  GI Imaging: GI Surgeries / Procedures:   Central access: pending TPN start date: 3/5?  Nutritional Goals (per RD recommendation on pending):  Estimated nutritional needs (not RD recs): KCal: 79 , Protein: 103-126, Fluid: 1580 -2680 ml Goal TPN rate is  mL/hr (provides  g of protein and  kcals per day)  Current Nutrition:   NPO  LR @ 125 ml/hr  Plan:  KCl 10 meq iv x 4  F/U central line placement and initiate TPN 3/5 if line placed and patent F/U labs and replace lytes prn as patient is high risk for refeeding syndrome  [id number] 03/23/2020,4:51 PM

## 2020-03-23 NOTE — Progress Notes (Signed)
  Amiodarone Drug - Drug Interaction Consult Note  Amiodarone is metabolized by the cytochrome P450 system and therefore has the potential to cause many drug interactions. Amiodarone has an average plasma half-life of 50 days (range 20 to 100 days).   There is potential for drug interactions to occur several weeks or months after stopping treatment and the onset of drug interactions may be slow after initiating amiodarone.   []  Statins: Increased risk of myopathy. Simvastatin- restrict dose to 20mg  daily. Other statins: counsel patients to report any muscle pain or weakness immediately.  []  Anticoagulants: Amiodarone can increase anticoagulant effect. Consider warfarin dose reduction. Patients should be monitored closely and the dose of anticoagulant altered accordingly, remembering that amiodarone levels take several weeks to stabilize.  []  Antiepileptics: Amiodarone can increase plasma concentration of phenytoin, the dose should be reduced. Note that small changes in phenytoin dose can result in large changes in levels. Monitor patient and counsel on signs of toxicity.  []  Beta blockers: increased risk of bradycardia, AV block and myocardial depression. Sotalol - avoid concomitant use.  [x]   Calcium channel blockers (diltiazem and verapamil): increased risk of bradycardia, AV block and myocardial depression.  []   Cyclosporine: Amiodarone increases levels of cyclosporine. Reduced dose of cyclosporine is recommended.  []  Digoxin dose should be halved when amiodarone is started.  []  Diuretics: increased risk of cardiotoxicity if hypokalemia occurs.  []  Oral hypoglycemic agents (glyburide, glipizide, glimepiride): increased risk of hypoglycemia. Patient's glucose levels should be monitored closely when initiating amiodarone therapy.   [x]  Drugs that prolong the QT interval:  Torsades de pointes risk may be increased with concurrent use - avoid if possible.  Monitor QTc, also keep  magnesium/potassium WNL if concurrent therapy can't be avoided. Antibiotics: e.g. fluoroquinolones, erythromycin. . Antiarrhythmics: e.g. quinidine, procainamide, disopyramide, sotalol. . Antipsychotics: e.g. phenothiazines, haloperidol.  . Lithium, tricyclic antidepressants, and methadone.  Ondansetron IV PRN    Thank You,  PharmD, BCPS Clinical Pharmacist [facility] main pharmacy [phone] 03/23/2020 11:53 AM

## 2020-03-23 NOTE — Consult Note (Signed)
WOC Nurse requested for preoperative stoma site marking  Discussed surgical procedure and stoma creation with patient.  Explained role of the WOC nurse team.  Provided the patient with educational booklet and provided samples of pouching options.  Answered patient questions. He is hopeful he will not require a diversion.  Emotional support provided and information that I will teach him self care with an ostomy while here in the hospital.   Examined patient lying, sitting, and standing in order to place the marking in the patient's visual field, away from any creases or abdominal contour issues and within the rectus muscle.  Patient has very low umbilicus, even though abdomen is distended, I think he would struggle to care with a low ostomy.  His belt line is the umbilical line.   Marked for colostomy in the LLQ  4 cm to the left of the umbilicus and 4 cm above the umbilicus.  Marked for ileostomy in the RLQ 4 cm to the right of the umbilicus and  4 cm above the umbilicus.    Patient's abdomen cleansed with CHG wipes at site markings, allowed to air dry prior to marking.Covered mark with thin film transparent dressing to preserve mark until date of surgery.   WOC Nurse team will follow up with patient after surgery for continued ostomy care and teaching.   Maple Hudson MSN, RN, FNP-BC CWON Wound, Ostomy, Continence Nurse Pager [phone]

## 2020-03-23 NOTE — TOC Progression Note (Addendum)
Transition of Care [facility]) - Progression Note    Patient Details  Name: Daniel Aguirre MRN: [id number] Date of Birth: 15-Feb-1950  Transition of Care [facility]) CM/SW Contact  Golda Acre, RN Phone Number: 03/23/2020, 7:50 AM  Clinical Narrative:    1. Sigmoid diverticulitis with perforation/fistulous tract-patient presented with abdominal pain, CT abdomen pelvis shows pneumoperitoneum.  General surgery was consulted.  Started on IV antibiotics initially Zosyn which was changed to ceftriaxone and Flagyl.  Surgery plans to treat conservatively for now.  If no improvement then plan for colectomy/colostomy. 2. Atrial fibrillation with RVR-patient has history of A. fib and is on Eliquis at home.  He went into A. fib with RVR, initially started on IV metoprolol however blood pressure dropped yesterday and heart rate not controlled.  We will switch to IV Cardizem gtt.  Patient started on IV heparin GTT. 3. Hematochezia-patient had small amount of bright red blood per rectum.  We will hold heparin at this time. 4. Hypertension-continue IV Cardizem gtt., will also start metoprolol 2.5 mg IV every 8 hours as needed for BP more than 160/100.  Will discontinue hydralazine. 5. CKD stage II-creatinine stable at 1.20. PLAN: to return to home with family/ may need long term abx iv will follow for toc needs.   Expected Discharge Plan: Home/Self Care Barriers to Discharge: Continued Medical Work up  Expected Discharge Plan and Services Expected Discharge Plan: Home/Self Care   Discharge Planning Services: CM Consult   Living arrangements for the past 2 months: Single Family Home                                       Social Determinants of Health (SDOH) Interventions    Readmission Risk Interventions No flowsheet data found.

## 2020-03-24 ENCOUNTER — Encounter (HOSPITAL_COMMUNITY): Payer: Self-pay | Admitting: Surgery

## 2020-03-24 DIAGNOSIS — K668 Other specified disorders of peritoneum: Secondary | ICD-10-CM | POA: Diagnosis not present

## 2020-03-24 DIAGNOSIS — N182 Chronic kidney disease, stage 2 (mild): Secondary | ICD-10-CM | POA: Diagnosis not present

## 2020-03-24 DIAGNOSIS — K631 Perforation of intestine (nontraumatic): Secondary | ICD-10-CM | POA: Diagnosis not present

## 2020-03-24 DIAGNOSIS — I480 Paroxysmal atrial fibrillation: Secondary | ICD-10-CM | POA: Diagnosis not present

## 2020-03-24 DIAGNOSIS — I10 Essential (primary) hypertension: Secondary | ICD-10-CM | POA: Diagnosis not present

## 2020-03-24 LAB — DIFFERENTIAL
Abs Immature Granulocytes: 0.06 10*3/uL (ref 0.00–0.07)
Basophils Absolute: 0 10*3/uL (ref 0.0–0.1)
Basophils Relative: 0 %
Eosinophils Absolute: 0 10*3/uL (ref 0.0–0.5)
Eosinophils Relative: 0 %
Immature Granulocytes: 1 %
Lymphocytes Relative: 4 %
Lymphs Abs: 0.4 10*3/uL — ABNORMAL LOW (ref 0.7–4.0)
Monocytes Absolute: 0.7 10*3/uL (ref 0.1–1.0)
Monocytes Relative: 7 %
Neutro Abs: 9.8 10*3/uL — ABNORMAL HIGH (ref 1.7–7.7)
Neutrophils Relative %: 88 %

## 2020-03-24 LAB — GLUCOSE, CAPILLARY
Glucose-Capillary: 135 mg/dL — ABNORMAL HIGH (ref 70–99)
Glucose-Capillary: 118 mg/dL — ABNORMAL HIGH (ref 70–99)
Glucose-Capillary: 137 mg/dL — ABNORMAL HIGH (ref 70–99)
Glucose-Capillary: 119 mg/dL — ABNORMAL HIGH (ref 70–99)
Glucose-Capillary: 140 mg/dL — ABNORMAL HIGH (ref 70–99)
Glucose-Capillary: 142 mg/dL — ABNORMAL HIGH (ref 70–99)

## 2020-03-24 LAB — COMPREHENSIVE METABOLIC PANEL
ALT: 12 U/L (ref 0–44)
AST: 12 U/L — ABNORMAL LOW (ref 15–41)
Albumin: 2.4 g/dL — ABNORMAL LOW (ref 3.5–5.0)
Alkaline Phosphatase: 40 U/L (ref 38–126)
Anion gap: 5 (ref 5–15)
BUN: 20 mg/dL (ref 8–23)
CO2: 23 mmol/L (ref 22–32)
Calcium: 7.1 mg/dL — ABNORMAL LOW (ref 8.9–10.3)
Chloride: 107 mmol/L (ref 98–111)
Creatinine, Ser: 1.25 mg/dL — ABNORMAL HIGH (ref 0.61–1.24)
GFR, Estimated: >60 mL/min (ref >60)
Glucose, Bld: 147 mg/dL — ABNORMAL HIGH (ref 70–99)
Potassium: 4 mmol/L (ref 3.5–5.1)
Sodium: 135 mmol/L (ref 135–145)
Total Bilirubin: 0.7 mg/dL (ref 0.3–1.2)
Total Protein: 4.9 g/dL — ABNORMAL LOW (ref 6.5–8.1)

## 2020-03-24 LAB — CBC
HCT: 40.7 % (ref 39.0–52.0)
Hemoglobin: 13.2 g/dL (ref 13.0–17.0)
MCH: 30.8 pg (ref 26.0–34.0)
MCHC: 32.4 g/dL (ref 30.0–36.0)
MCV: 94.9 fL (ref 80.0–100.0)
Platelets: 211 10*3/uL (ref 150–400)
RBC: 4.29 MIL/uL (ref 4.22–5.81)
RDW: 14.5 % (ref 11.5–15.5)
WBC: 11 10*3/uL — ABNORMAL HIGH (ref 4.0–10.5)
nRBC: 0 % (ref 0.0–0.2)

## 2020-03-24 LAB — PHOSPHORUS: Phosphorus: 3 mg/dL (ref 2.5–4.6)

## 2020-03-24 LAB — TRIGLYCERIDES: Triglycerides: 61 mg/dL (ref <150)

## 2020-03-24 LAB — MAGNESIUM: Magnesium: 1.8 mg/dL (ref 1.7–2.4)

## 2020-03-24 LAB — PREALBUMIN: Prealbumin: 8.4 mg/dL — ABNORMAL LOW (ref 18–38)

## 2020-03-24 LAB — ABO/RH: ABO/RH(D): O POS

## 2020-03-24 MED ORDER — TRAVASOL 10 % IV SOLN
INTRAVENOUS | Status: AC
  Filled 2020-03-24: qty 480

## 2020-03-24 MED ORDER — SODIUM CHLORIDE 0.9 % IV SOLN
INTRAVENOUS | Status: DC | PRN
  Administered 2020-03-24 – 2020-03-25 (×3): 500 mL via INTRAVENOUS
  Administered 2020-03-26 – 2020-03-30 (×2): 1000 mL via INTRAVENOUS

## 2020-03-24 MED ORDER — LACTATED RINGERS IV SOLN: INTRAVENOUS | Status: DC

## 2020-03-24 MED ORDER — HYDRALAZINE HCL 20 MG/ML IJ SOLN
10.0000 mg | Freq: Once | INTRAMUSCULAR | Status: DC
  Filled 2020-03-24: qty 1

## 2020-03-24 MED ORDER — SODIUM CHLORIDE 0.9% FLUSH
10.0000 mL | Freq: Two times a day (BID) | INTRAVENOUS | Status: DC
  Administered 2020-03-24: 30 mL
  Administered 2020-03-24: 10 mL
  Administered 2020-03-25: 20 mL
  Administered 2020-03-25: 40 mL
  Administered 2020-03-26 – 2020-03-30 (×7): 10 mL

## 2020-03-24 MED ORDER — CLONIDINE HCL 0.1 MG PO TABS
0.2000 mg | ORAL_TABLET | Freq: Once | ORAL | Status: AC
  Administered 2020-03-24: 0.2 mg via ORAL
  Filled 2020-03-24: qty 2

## 2020-03-24 MED ORDER — METOPROLOL TARTRATE 5 MG/5ML IV SOLN
2.5000 mg | Freq: Three times a day (TID) | INTRAVENOUS | Status: DC
Start: 2020-03-24 — End: 2020-03-24

## 2020-03-24 MED ORDER — ENOXAPARIN SODIUM 40 MG/0.4ML ~~LOC~~ SOLN
40.0000 mg | Freq: Every day | SUBCUTANEOUS | Status: DC
  Administered 2020-03-24: 40 mg via SUBCUTANEOUS
  Filled 2020-03-24: qty 0.4

## 2020-03-24 MED ORDER — METOPROLOL TARTRATE 5 MG/5ML IV SOLN
2.5000 mg | Freq: Four times a day (QID) | INTRAVENOUS | Status: DC
  Administered 2020-03-24 – 2020-03-25 (×3): 2.5 mg via INTRAVENOUS
  Filled 2020-03-24 (×3): qty 5

## 2020-03-24 MED ORDER — SODIUM CHLORIDE 0.9% FLUSH
10.0000 mL | INTRAVENOUS | Status: DC | PRN
Start: 2020-03-24 — End: 2020-04-02

## 2020-03-24 MED ORDER — HYDRALAZINE HCL 20 MG/ML IJ SOLN
10.0000 mg | Freq: Four times a day (QID) | INTRAMUSCULAR | Status: DC | PRN
  Administered 2020-03-24: 10 mg via INTRAVENOUS
  Filled 2020-03-24: qty 1

## 2020-03-24 MED ORDER — INSULIN ASPART 100 UNIT/ML ~~LOC~~ SOLN
0.0000 [IU] | Freq: Four times a day (QID) | SUBCUTANEOUS | Status: DC
  Administered 2020-03-24 – 2020-03-25 (×4): 1 [IU] via SUBCUTANEOUS
  Administered 2020-03-25: 2 [IU] via SUBCUTANEOUS
  Administered 2020-03-26: 1 [IU] via SUBCUTANEOUS
  Administered 2020-03-26: 2 [IU] via SUBCUTANEOUS
  Administered 2020-03-26 (×2): 1 [IU] via SUBCUTANEOUS
  Administered 2020-03-27 (×2): 2 [IU] via SUBCUTANEOUS
  Administered 2020-03-27 – 2020-03-29 (×7): 1 [IU] via SUBCUTANEOUS

## 2020-03-24 MED ORDER — MAGNESIUM SULFATE 2 GM/50ML IV SOLN
2.0000 g | Freq: Once | INTRAVENOUS | Status: AC
  Administered 2020-03-24: 2 g via INTRAVENOUS
  Filled 2020-03-24: qty 50

## 2020-03-24 NOTE — Progress Notes (Signed)
Telephone consent obtained from wife at bedside with pt verbal consent.  Pt postop and on PCA pain meds. PICC consent obtained.

## 2020-03-24 NOTE — Progress Notes (Signed)
Initial Nutrition Assessment  DOCUMENTATION CODES:   Not applicable  INTERVENTION:    TPN dosing per Pharmacy to meet nutrition needs as able.   NUTRITION DIAGNOSIS:   Increased nutrient needs related to acute illness (pneumoperitoneum r/t sigmoid diverticulitis with perforation/fistulous tract) as evidenced by estimated needs.  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 PMH includes HTN, HLD, diastolic dysfunction, renal insufficiency, A fb, recent diverticulitis with abscess.   Patient was on clear liquids 3/2-3/3 with 20-50% meal intakes documented.  S/P exploratory laparotomy 3/4 with partial colectomy, colostomy, and Hartmann's pouch.  Currently NPO. PICC being placed this afternoon for TPN administration. NG tube in place to LIS.  Labs reviewed. Prealbumin 8.4 CBG: 135-118  Medications reviewed and include Novolog.  Patient with 3% weight loss since February 17, 2020; not significant for the time frame.   NUTRITION - FOCUSED PHYSICAL EXAM:  unable to complete  Diet Order:   Diet Order            Diet NPO time specified  Diet effective midnight                 EDUCATION NEEDS:   Not appropriate for education at this time  Skin:  Skin Assessment: Reviewed RN Assessment  Last BM:  3/4 type 7  Height:   Ht Readings from Last 1 Encounters:  03/23/20 5\' 8"  (1.727 m)    Weight:   Wt Readings from Last 1 Encounters:  03/23/20 79.4 kg    Ideal Body Weight:  70 kg  BMI:  Body mass index is 26.62 kg/m.  Estimated Nutritional Needs:   Kcal:  2100-2300  Protein:  120-135 gm  Fluid:  2.1-2.3 L    05/23/20, RD, LDN, CNSC Please refer to Amion for contact information.

## 2020-03-24 NOTE — Progress Notes (Addendum)
Patient does not want to take hydralazine, states "a few days ago had difficulty breathing/suffocation and terrible nightmares" after receiving it. Currently BP 199/92 with HR 90, patient wants to know about taking meds taken before e.g. cardizem. Explained current meds and orders to patient, but he insists he prefers not to take hydralazine. Will notify provider for further orders. Cindy S. Clelia Croft BSN, RN, CCRP 03/24/2020 8:47 PM   03/24/2020 Contacted provider re. Continued elevated BP, patient does not want apresoline. Awaiting orders. Cindy S. Clelia Croft BSN, RN, CCRP 03/24/2020 9:13 PM   03/24/2020 Discussed new order for clonidine with patient and wife who initially questioned why patient wasn't being given his usual meds for BP. Explained need to treat BP promptly and that current prn IV and other meds do not indicate planned therapy post-discharge. Patient agreed to take clonidine as ordered. Cindy S. Clelia Croft BSN, RN, [facility] 03/24/2020 9:30PM

## 2020-03-24 NOTE — Progress Notes (Signed)
Peripherally Inserted Central Catheter Placement  The IV Nurse has discussed with the patient and/or persons authorized to consent for the patient, the purpose of this procedure and the potential benefits and risks involved with this procedure.  The benefits include less needle sticks, lab draws from the catheter, and the patient may be discharged home with the catheter. Risks include, but not limited to, infection, bleeding, blood clot (thrombus formation), and puncture of an artery; nerve damage and irregular heartbeat and possibility to perform a PICC exchange if needed/ordered by physician.  Alternatives to this procedure were also discussed.  Bard Power PICC patient education guide, fact sheet on infection prevention and patient information card has been provided to patient /or left at bedside.  PICC placed by Chari Manning, RN  PICC Placement Documentation  PICC Triple Lumen 03/24/20 PICC Right Basilic 40 cm 0 cm (Active)  Indication for Insertion or Continuance of Line Administration of hyperosmolar/irritating solutions (i.e. TPN, Vancomycin, etc.) 03/24/20 1214  Exposed Catheter (cm) 0 cm 03/24/20 1214  Site Assessment Clean;Dry;Intact 03/24/20 1214  Lumen #1 Status Flushed;Saline locked;Blood return noted 03/24/20 1214  Lumen #2 Status Flushed;Saline locked;Blood return noted 03/24/20 1214  Lumen #3 Status Flushed;Saline locked;Blood return noted 03/24/20 1214  Dressing Type Transparent 03/24/20 1214  Dressing Status Clean;Dry;Intact 03/24/20 1214  Antimicrobial disc in place? Yes 03/24/20 1214  Safety Lock Not Applicable 03/24/20 1214  Line Care Connections checked and tightened 03/24/20 1214  Line Adjustment (NICU/IV Team Only) No 03/24/20 1214  Dressing Intervention New dressing 03/24/20 1214  Dressing Change Due 03/31/20 03/24/20 1214       Daniel Aguirre, Daniel Aguirre 03/24/2020, 12:16 PM

## 2020-03-24 NOTE — Progress Notes (Signed)
[facility]  PROGRESS NOTE  Fanny SkatesDennis C Petroni ZOX:[id number] DOB: November 18, 1950 DOA: 03/19/2020 PCP: Daisy Florooss, Charles Alan, MD   Brief HPI:   70 year old male with medical history of hypertension, mild renal insufficiency, atrial fibrillation on Eliquis and admission in early January for diverticulitis with abscess presented to ED with complaints of severe abdominal pain.  Patient was managed conservatively 2 months ago for diverticulitis with abscess that was not amenable to IR drainage.  At that time he was discharged on 4 weeks of outpatient antibiotics with Augmentin.  Patient said that he was doing fine and did developed abdominal pain a week ago, he started taking Augmentin which was left over from previous prescription.  Patient waterbrash on his chest abdominal.  He was seen by telehealth provider and was given prednisone 50 mg for 2 days.  After that patient developed left lower quadrant pain and came to ED for further evaluation. In the ED CT scan of the abdomen pelvis showed new pneumoperitoneum, this was due to previously demonstrated sigmoid diverticulitis.  General surgery was consulted.  Patient started on IV antibiotics including ceftriaxone and Flagyl.    Subjective   Patient seen and examined, s/p sigmoid colectomy, colostomy and Hartman's pouch formation.  Denies pain.  Appreciate cardiology input for uncontrolled A. fib with RVR.   Assessment/Plan:     1. Sigmoid diverticulitis with perforation/fistulous tract-s/p sigmoid colectomy, colostomy and Hartman's pouch formation.  Patient was diagnosed with diverticulitis with abscess 2 months ago, at that time abscess was not amenable for drainage per IR.  So patient was treated with IV antibiotics and discharged on p.o. Augmentin.  Patient again developed abdominal pain, came to ED and CT abdomen/pelvis showed pneumoperitoneum.  General surgery was consulted, plan was to initially treat with IV antibiotics including ceftriaxone and  Flagyl and observe.  However patient was not progressing well, continue to have abdominal pain and tenderness.  So plan was to take him for surgery.  Underwent surgery yesterday.   Pain well controlled on morphine PCA pump. 2. Atrial fibrillation with RVR-patient has history of A. fib and is on Eliquis at home.  He went into A. fib with RVR, initially started on IV metoprolol however blood pressure dropped yesterday and heart rate not controlled.  Patient was switched to IV Cardizem gtt. however despite max dose of Cardizem patient continued to be in A. fib with RVR.  Cardiology was consulted and patient started on IV amiodarone.  Currently he is back in normal sinus rhythm.   Heparin GTT was started however it was stopped after patient developed hematochezia.  Will restart Eliquis. 3. Hematochezia-patient had small amount of bright red blood per rectum.  Heparin was started initially which was held.  Will restart Eliquis.  Surgery has recommended to restart anticoagulation. 4. Hypertension-blood pressure is mildly elevated, continue metoprolol 5 mg IV every 6 hours as needed.  Cardiology managing other medications. 5. CKD stage II-creatinine is stable at 1.25     COVID-19 Labs  No results for input(s): DDIMER, FERRITIN, LDH, CRP in the last 72 hours.  Lab Results  Component Value Date   SARSCOV2NAA NEGATIVE 03/20/2020   SARSCOV2NAA NEGATIVE 01/23/2020     Scheduled medications:   . Chlorhexidine Gluconate Cloth  6 each Topical Once  . Chlorhexidine Gluconate Cloth  6 each Topical Daily  . insulin aspart  0-9 Units Subcutaneous Q6H  . mouth rinse  15 mL Mouth Rinse BID  . morphine   Intravenous Q4H  CBG: Recent Labs  Lab 03/23/20 2009 03/23/20 2330 03/24/20 0354 03/24/20 0810  GLUCAP 160* 162* 135* 118*    SpO2: 97 % O2 Flow Rate (L/min): 2 L/min FiO2 (%): 100 %    CBC: Recent Labs  Lab 03/20/20 0014 03/21/20 0442 03/22/20 0441 03/23/20 0759 03/24/20 0251   WBC 18.4* 15.5* 12.3* 11.2* 11.0*  NEUTROABS 15.4*  --   --  9.2* 9.8*  HGB 15.7 14.8 14.7 14.2 13.2  HCT 45.9 45.2 44.0 43.4 40.7  MCV 90.9 93.4 92.8 93.9 94.9  PLT 253 200 202 219 211    Basic Metabolic Panel: Recent Labs  Lab 03/20/20 0014 03/21/20 0442 03/22/20 0441 03/23/20 0235 03/24/20 0251  NA 139 136 138 139 135  K 4.2 4.1 3.7 3.4* 4.0  CL 105 104 105 105 107  CO2 23 21* 21* 25 23  GLUCOSE 145* 125* 127* 119* 147*  BUN 27* 25* 23 24* 20  CREATININE 1.54* 1.35* 1.20 1.38* 1.25*  CALCIUM 9.1 8.5* 8.6* 8.6* 7.1*  MG  --  2.0  --   --  1.8  PHOS  --   --   --   --  3.0     Liver Function Tests: Recent Labs  Lab 03/20/20 0014 03/24/20 0251  AST 16 12*  ALT 25 12  ALKPHOS 65 40  BILITOT 1.4* 0.7  PROT 7.6 4.9*  ALBUMIN 4.3 2.4*     Antibiotics: Anti-infectives (From admission, onward)   Start     Dose/Rate Route Frequency Ordered Stop   03/23/20 1000  clindamycin (CLEOCIN) 900 mg, gentamicin (GARAMYCIN) 240 mg in sodium chloride 0.9 % 1,000 mL for intraperitoneal lavage  Status:  Discontinued         Irrigation To Surgery 03/23/20 0724 03/23/20 1705   03/23/20 0815  cefoTEtan (CEFOTAN) 2 g in sodium chloride 0.9 % 100 mL IVPB        2 g 200 mL/hr over 30 Minutes Intravenous On call to O.R. 03/23/20 0724 03/23/20 1332   03/20/20 1400  piperacillin-tazobactam (ZOSYN) IVPB 3.375 g  Status:  Discontinued        3.375 g 12.5 mL/hr over 240 Minutes Intravenous Every 8 hours 03/20/20 0623 03/20/20 0636   03/20/20 1200  cefTRIAXone (ROCEPHIN) 2 g in sodium chloride 0.9 % 100 mL IVPB        2 g 200 mL/hr over 30 Minutes Intravenous Every 24 hours 03/20/20 0636     03/20/20 1200  metroNIDAZOLE (FLAGYL) IVPB 500 mg        500 mg 100 mL/hr over 60 Minutes Intravenous Every 8 hours 03/20/20 0636     03/20/20 0530  piperacillin-tazobactam (ZOSYN) IVPB 3.375 g        3.375 g 100 mL/hr over 30 Minutes Intravenous  Once 03/20/20 0516 03/20/20 0609       DVT  prophylaxis: SCDs  Code Status: Full code  Family Communication: No family at bedside   Consultants:  General surgery  Procedures:      Objective   Vitals:   03/24/20 0400 03/24/20 0600 03/24/20 0800 03/24/20 0817  BP:  (!) 169/97 (!) 185/107   Pulse:  92 94   Resp: 10 12 12  (!) 23  Temp: 98.4 F (36.9 C)  98.3 F (36.8 C) 98.3 F (36.8 C)  TempSrc: Axillary  Oral Oral  SpO2: 97% 97% 97%   Weight:      Height:        Intake/Output Summary (Last 24  hours) at 03/24/2020 0947 Last data filed at 03/24/2020 0800 Gross per 24 hour  Intake 5976.14 ml  Output 1815 ml  Net 4161.14 ml    03/03 1901 - 03/05 0700 In: 5725.4 [I.V.:4877.6] Out: 1540 [Urine:1200; Drains:40]  Filed Weights   03/19/20 2359 03/20/20 0952 03/23/20 1221  Weight: 81.6 kg 79.4 kg 79.4 kg    Physical Examination:    General-appears in no acute distress  Heart-S1-S2, regular, no murmur auscultated  Lungs-clear to auscultation bilaterally, no wheezing or crackles auscultated  Abdomen-soft, nontender, colostomy in place in left lower quadrant  Extremities-no edema in the lower extremities  Neuro-alert, oriented x3, no focal deficit noted   Status is: Inpatient  Dispo: The patient is from: Home              Anticipated d/c is to: Home              Anticipated d/c date is: 03/26/2020              Patient currently not stable for discharge  Barrier to discharge-ongoing management for diverticulitis abscess with perforation.        Data Reviewed:   Recent Results (from the past 240 hour(s))  Resp Panel by RT-PCR (Flu A&B, Covid) Nasopharyngeal Swab     Status: None   Collection Time: 03/20/20  5:42 AM   Specimen: Nasopharyngeal Swab; Nasopharyngeal(NP) swabs in vial transport medium  Result Value Ref Range Status   SARS Coronavirus 2 by RT PCR NEGATIVE NEGATIVE Final    Comment: (NOTE) SARS-CoV-2 target nucleic acids are NOT DETECTED.  The SARS-CoV-2 RNA is generally detectable  in upper respiratory specimens during the acute phase of infection. The lowest concentration of SARS-CoV-2 viral copies this assay can detect is 138 copies/mL. A negative result does not preclude SARS-Cov-2 infection and should not be used as the sole basis for treatment or other patient management decisions. A negative result may occur with  improper specimen collection/handling, submission of specimen other than nasopharyngeal swab, presence of viral mutation(s) within the areas targeted by this assay, and inadequate number of viral copies(<138 copies/mL). A negative result must be combined with clinical observations, patient history, and epidemiological information. The expected result is Negative.  Fact Sheet for Patients:  BloggerCourse.com  Fact Sheet for Healthcare Providers:  SeriousBroker.it  This test is no t yet approved or cleared by the [place] FDA and  has been authorized for detection and/or diagnosis of SARS-CoV-2 by FDA under an Emergency Use Authorization (EUA). This EUA will remain  in effect (meaning this test can be used) for the duration of the COVID-19 declaration under Section 564(b)(1) of the Act, 21 U.S.C.section 360bbb-3(b)(1), unless the authorization is terminated  or revoked sooner.       Influenza A by PCR NEGATIVE NEGATIVE Final   Influenza B by PCR NEGATIVE NEGATIVE Final    Comment: (NOTE) The Xpert Xpress SARS-CoV-2/FLU/RSV plus assay is intended as an aid in the diagnosis of influenza from Nasopharyngeal swab specimens and should not be used as a sole basis for treatment. Nasal washings and aspirates are unacceptable for Xpert Xpress SARS-CoV-2/FLU/RSV testing.  Fact Sheet for Patients: BloggerCourse.com  Fact Sheet for Healthcare Providers: SeriousBroker.it  This test is not yet approved or cleared by the [place] FDA and has  been authorized for detection and/or diagnosis of SARS-CoV-2 by FDA under an Emergency Use Authorization (EUA). This EUA will remain in effect (meaning this test can be used) for the duration  of the COVID-19 declaration under Section 564(b)(1) of the Act, 21 U.S.C. section 360bbb-3(b)(1), unless the authorization is terminated or revoked.  Performed at [facility], [address], [place], [place] [address]   MRSA PCR Screening     Status: None   Collection Time: 03/20/20  9:39 AM   Specimen: Nasal Mucosa; Nasopharyngeal  Result Value Ref Range Status   MRSA by PCR NEGATIVE NEGATIVE Final    Comment:        The GeneXpert MRSA Assay (FDA approved for NASAL specimens only), is one component of a comprehensive MRSA colonization surveillance program. It is not intended to diagnose MRSA infection nor to guide or monitor treatment for MRSA infections. Performed at [facility], [address], [place], [place] [address]     Recent Labs  Lab 03/20/20 [phone]  LIPASE 37        Gagan S Lama   [facility] If 7PM-7AM, please contact night-coverage at [email or web address], Office  [phone]   03/24/2020, 9:47 AM  LOS: 4 days

## 2020-03-24 NOTE — Progress Notes (Signed)
1 Day Post-Op   Subjective/Chief Complaint: Pt doing well this AM Pain controlled   Objective: Vital signs in last 24 hours: Temp:  [97.3 F (36.3 C)-98.4 F (36.9 C)] 98.4 F (36.9 C) (03/05 0400) Pulse Rate:  [35-157] 92 (03/05 0600) Resp:  [0-30] 12 (03/05 0600) BP: (86-191)/(53-108) 169/97 (03/05 0600) SpO2:  [90 %-98 %] 97 % (03/05 0600) FiO2 (%):  [100 %] 100 % (03/04 2101) Weight:  [79.4 kg] 79.4 kg (03/04 1221) Last BM Date: 03/22/20  Intake/Output from previous day: 03/04 0701 - 03/05 0700 In: 5349.5 [I.V.:4699.5; IV Piggyback:650] Out: 1540 [Urine:1200; Drains:40; Blood:100] Intake/Output this shift: Total I/O In: 1765.8 [I.V.:1665.8; IV Piggyback:100] Out: 600 [Urine:600]  General appearance: alert and cooperative GI: soft, non-tender; bowel sounds normal; no masses,  no organomegaly and ostomy pink patent, inc dressed  Lab Results:  Recent Labs    03/23/20 0759 03/24/20 0251  WBC 11.2* 11.0*  HGB 14.2 13.2  HCT 43.4 40.7  PLT 219 211   BMET Recent Labs    03/23/20 0235 03/24/20 0251  NA 139 135  K 3.4* 4.0  CL 105 107  CO2 25 23  GLUCOSE 119* 147*  BUN 24* 20  CREATININE 1.38* 1.25*  CALCIUM 8.6* 7.1*   PT/INR No results for input(s): LABPROT, INR in the last 72 hours. ABG No results for input(s): PHART, HCO3 in the last 72 hours.  Invalid input(s): PCO2, PO2  Studies/Results: [place] EKG SITE RITE  Result Date: 03/23/2020 If Site Rite image not attached, placement could not be confirmed due to current cardiac rhythm.   Anti-infectives: Anti-infectives (From admission, onward)   Start     Dose/Rate Route Frequency Ordered Stop   03/23/20 1000  clindamycin (CLEOCIN) 900 mg, gentamicin (GARAMYCIN) 240 mg in sodium chloride 0.9 % 1,000 mL for intraperitoneal lavage  Status:  Discontinued         Irrigation To Surgery 03/23/20 0724 03/23/20 1705   03/23/20 0815  cefoTEtan (CEFOTAN) 2 g in sodium chloride 0.9 % 100 mL IVPB        2 g 200  mL/hr over 30 Minutes Intravenous On call to O.R. 03/23/20 0724 03/23/20 1332   03/20/20 1400  piperacillin-tazobactam (ZOSYN) IVPB 3.375 g  Status:  Discontinued        3.375 g 12.5 mL/hr over 240 Minutes Intravenous Every 8 hours 03/20/20 0623 03/20/20 0636   03/20/20 1200  cefTRIAXone (ROCEPHIN) 2 g in sodium chloride 0.9 % 100 mL IVPB        2 g 200 mL/hr over 30 Minutes Intravenous Every 24 hours 03/20/20 0636     03/20/20 1200  metroNIDAZOLE (FLAGYL) IVPB 500 mg        500 mg 100 mL/hr over 60 Minutes Intravenous Every 8 hours 03/20/20 0636     03/20/20 0530  piperacillin-tazobactam (ZOSYN) IVPB 3.375 g        3.375 g 100 mL/hr over 30 Minutes Intravenous  Once 03/20/20 0516 03/20/20 0609      Assessment/Plan: s/p Procedure(s): EXPLORATORY LAPAROTOMY (N/A) PARTIAL COLECTOMY (N/A) COLOSTOMY (N/A)   Atrial fibrillation on Eliquis Hypertension Mild renal insufficiency  -Creatinine 1.54>> 1.35>> 1.20 Rash-chest, abdomen, groinstable  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

## 2020-03-24 NOTE — Progress Notes (Signed)
Spoke with Daniel Muir RN re PICC order.  States will clarify with Dr Sharl Ma when rounds re double lumen ordered vs triple. Pt has significant number intravenous medications ordered vs length of time expected to be on medicated drips.  Has adequate IV access at this time.  Will plan on PICC placement this pm in order for TNA to be delivered.

## 2020-03-24 NOTE — Progress Notes (Signed)
PHARMACY - TOTAL PARENTERAL NUTRITION CONSULT NOTE   Indication: Prolonged ileus  Patient Measurements: Height: 5\' 8"  (172.7 cm) Weight: 79.4 kg (175 lb 0.7 oz) IBW/kg (Calculated) : 68.4 TPN AdjBW (KG): 79.4 Body mass index is 26.62 kg/m. 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 Patient was initially treated medically but decision was made for surgical intervention. Patient is now s/p sigmoid colectomy and colostomy with Hartman's pouch on 3/4.  He is now over 5 days with no PO intake for surgery on 3/4, and anticipate prolonged ileus.  He is high risk for refeeding syndrome.    Glucose / Insulin: CBGs 119-142 after starting TPN. 3 units SSI given / 24 hrs. No hx of DM noted. Electrolytes: Phos decreased to 2.1, K 3.8 (goal K > 4 on amiodarone), Mag 2.3 (goal > 2), CorrCa 8.78.  Others wnl Renal: SCr 1.13, BUN 22 Hepatic: AST/ALT, Tbili WNL Intake / Output; MIVF: I/O +2L yesterday, UOP 2.4L - NG tube placed 3/4 to low intermittent suction w/ Bile/Brown drainage.  No output recorded from NG or colostomy.  - IVF:  LR @ 85 Prealbumin:  12 (3/4) > 8.4 (3/5) GI Surgeries / Procedures:  3/4 Sigmoid colectomy and colostomy and Hartman's pouch formation  Central access: Triple lumen PICC placed 3/5 TPN start date: 3/5  Nutritional Goals (per RD recommendation on 3/5): Kcal:  2100-2300, Protein:  120-135 gm, Fluid:  2.1-2.3 L  Goal TPN rate is 100 mL/hr (provides 125g of protein and 7 day average of 2194 kcals per day)  With Lipids (SMOF 22 g/L = 53g) on MWF only d/t national shortage: 2496 kcal/day  Without lipids on TTSS: 1968 kcal/day  Current Nutrition:  NPO and TPN  Plan:  Now:  KPhos 15 mmol IV x1 dose (~22 mEq K+)  At 18:00: Advance TPN 60 mL/hr at 1800 Electrolytes in TPN: Na 55mEq/L, K 55mEq/L, Ca 32mEq/L, Mg 71mEq/L, and Phos inc to 20 mmol/L. Cl:Ac  1:1 Standard MVI and trace elements to TPN CBGs and sensitive SSI q6h Reduce MIVF to 65 mL/hr at 1800 Monitor TPN labs on Mon/Thurs.  4m PharmD, BCPS Clinical Pharmacist [facility] main pharmacy [phone] 03/24/2020 1:54 PM

## 2020-03-24 NOTE — Progress Notes (Addendum)
Progress Note  Patient Name: Daniel Aguirre Date of Encounter: 03/24/2020  [facility] Cardiologist: Rollene Rotunda, MD   Subjective   S/P abdominal surgery yesterday with sigmoid colectomy/Hartman's colostomy. Doing well today. Back in normal sinus rhythm.  Inpatient Medications    Scheduled Meds: . Chlorhexidine Gluconate Cloth  6 each Topical Once  . Chlorhexidine Gluconate Cloth  6 each Topical Daily  . enoxaparin (LOVENOX) injection  40 mg Subcutaneous Daily  . insulin aspart  0-9 Units Subcutaneous Q6H  . mouth rinse  15 mL Mouth Rinse BID  . morphine   Intravenous Q4H  . sodium chloride flush  10-40 mL Intracatheter Q12H   Continuous Infusions: . sodium chloride Stopped (03/24/20 1244)  . acetaminophen Stopped (03/24/20 0929)  . amiodarone 30 mg/hr (03/24/20 1305)  . cefTRIAXone (ROCEPHIN)  IV 200 mL/hr at 03/24/20 1305  . lactated ringers 125 mL/hr at 03/24/20 1305  . lactated ringers    . magnesium sulfate bolus IVPB    . methocarbamol (ROBAXIN) IV Stopped (03/24/20 1154)  . metronidazole Stopped (03/24/20 1116)  . TPN ADULT (ION)     PRN Meds: sodium chloride, diphenhydrAMINE **OR** diphenhydrAMINE, metoprolol tartrate, naloxone **AND** sodium chloride flush, ondansetron (ZOFRAN) IV, ondansetron (ZOFRAN) IV, sodium chloride flush   Vital Signs    Vitals:   03/24/20 0900 03/24/20 1000 03/24/20 1200 03/24/20 1305  BP:  (!) 178/97    Pulse: 93 96    Resp: 18 (!) 26  12  Temp:   98.2 F (36.8 C)   TempSrc:   Oral   SpO2: 97% 97%  96%  Weight:      Height:        Intake/Output Summary (Last 24 hours) at 03/24/2020 1313 Last data filed at 03/24/2020 1309 Gross per 24 hour  Intake 6466.84 ml  Output 1815 ml  Net 4651.84 ml   Last 3 Weights 03/23/2020 03/20/2020 03/19/2020  Weight (lbs) 175 lb 0.7 oz 175 lb 180 lb  Weight (kg) 79.4 kg 79.379 kg 81.647 kg      Telemetry    Now in sinus rhythm - Personally Reviewed  ECG    No new since 3/3 -  Personally Reviewed  Physical Exam   GEN: No acute distress.  NG tube in place Neck: No JVD Cardiac: RRR, no murmurs, rubs, or gallops.  Respiratory: Clear to auscultation bilaterally. GI: not examined, post op and about to have sterile dressing done MS: No edema; No deformity. Neuro:  Nonfocal  Psych: Normal affect   Labs    High Sensitivity Troponin:  No results for input(s): TROPONINIHS in the last 720 hours.    Chemistry Recent Labs  Lab 03/20/20 0014 03/21/20 0442 03/22/20 0441 03/23/20 0235 03/24/20 0251  NA 139   < > 138 139 135  K 4.2   < > 3.7 3.4* 4.0  CL 105   < > 105 105 107  CO2 23   < > 21* 25 23  GLUCOSE 145*   < > 127* 119* 147*  BUN 27*   < > 23 24* 20  CREATININE 1.54*   < > 1.20 1.38* 1.25*  CALCIUM 9.1   < > 8.6* 8.6* 7.1*  PROT 7.6  --   --   --  4.9*  ALBUMIN 4.3  --   --   --  2.4*  AST 16  --   --   --  12*  ALT 25  --   --   --  12  ALKPHOS 65  --   --   --  40  BILITOT 1.4*  --   --   --  0.7  GFRNONAA 49*   < > >60 56* >60  ANIONGAP 11   < > 12 9 5    < > = values in this interval not displayed.     Hematology Recent Labs  Lab 03/22/20 0441 03/23/20 0759 03/24/20 0251  WBC 12.3* 11.2* 11.0*  RBC 4.74 4.62 4.29  HGB 14.7 14.2 13.2  HCT 44.0 43.4 40.7  MCV 92.8 93.9 94.9  MCH 31.0 30.7 30.8  MCHC 33.4 32.7 32.4  RDW 14.3 14.5 14.5  PLT 202 219 211    BNPNo results for input(s): BNP, PROBNP in the last 168 hours.   DDimer No results for input(s): DDIMER in the last 168 hours.   Radiology    05/24/20 EKG SITE RITE  Result Date: 03/23/2020 If Site Rite image not attached, placement could not be confirmed due to current cardiac rhythm.   Cardiac Studies   No new this admission  Patient Profile     70 y.o. male with PMH paroxysmal atrial fibrillation, hypertension, aortic insufficiency who was seen at the request of Dr. 79 for atrial fibrillation with RVR  Assessment & Plan    Paroxysmal atrial fibrillation -prior to  surgery, was in atrial fibrillation with RVR, difficult to manage due to hypotension -now in sinus rhythm -would continue IV amiodarone for today given recent surgery to prevent recurrence of RVR -no plans to continue amiodarone long term -blood pressure much improved post op, can use diltiazem or metoprolol for rate control. Was on diltiazem 240 mg daily and metoprolol 25 mg BID as an outpatient -CHA2DS2/VAS Stroke Risk Points= 2  -given that he had reported hematochezia on heparin, would not start apixaban today. Would consider retrialing heparin drip prior to starting apixaban as a challenge, to minimize risk of bleeding.   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

## 2020-03-24 NOTE — Progress Notes (Signed)
PHARMACY - TOTAL PARENTERAL NUTRITION CONSULT NOTE   Indication: Prolonged ileus  Patient Measurements: Height: 5\' 8"  (172.7 cm) Weight: 79.4 kg (175 lb 0.7 oz) IBW/kg (Calculated) : 68.4 TPN AdjBW (KG): 79.4 Body mass index is 26.62 kg/m. 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 Patient was initially treated medically but decision was made for surgical intervention. Patient is now s/p sigmoid colectomy and colostomy with Hartman's pouch on 3/4.  He is now over 5 days with no PO intake for surgery on 3/4, and anticipate prolonged ileus.  He is high risk for refeeding syndrome.    Glucose / Insulin: CBGs 96-162 while NPO prior to starting TPN.  No hx of DM noted. Electrolytes: K 4.0 (goal K > 4 on amiodarone), Mag 1.8 (goal > 2), CorrCa 8.38 Renal: SCr 1.25 Hepatic: AST/ALT, Tbili WNL Intake / Output; MIVF: I/O +3.9 L yesterday, UOP 1200 mL - NG tube placed 3/4 to low intermittent suction w/ Bile/Brown drainage - Monitor for Colostomy output - IVF:  LR @ 125 GI Surgeries / Procedures:  3/4 Sigmoid colectomy and colostomy and Hartman's pouch formation  Central access: PICC ordered for 3/5 TPN start date: 3/5  Nutritional Goals (per RD recommendation pending): kCal: , Protein: , Fluid:   Estimated nutritional needs (Rx on 3/4, pending RD eval): KCal: 5/5 , Protein: 103-126, Fluid: 1580 -2680 ml  Goal TPN rate is 90 mL/hr (provides 108 g of protein and 7 day average of 2131 kcals per day)  Lipids on MWF only d/t national shortage: Lipids 25 g/l (54g) provide 2440.8 kcal/day  Without lipids on TTSS: 1900 kcal/day  Current Nutrition:  NPO  Plan:  Now: Mag 2g IV bolus x1 dose  At 18:00: Start TPN at 40 mL/hr at 1800 Electrolytes in TPN: Na 90mEq/L, K 49mEq/L, Ca 8mEq/L, Mg 57mEq/L, and Phos 69mmol/L. Cl:Ac 1:1 Add standard MVI and trace elements to  TPN Start CBGs and sensitive SSI q6h Reduce MIVF to 85 mL/hr at 1800 Monitor TPN labs on Mon/Thurs, tomorrow morning.   12m PharmD, BCPS Clinical Pharmacist [facility] main pharmacy [phone] 03/24/2020 8:26 AM

## 2020-03-24 NOTE — Progress Notes (Signed)
PROGRESS NOTE    Daniel Aguirre  HQI:[id number] DOB: November 10, 1950 DOA: 03/19/2020 PCP: Daisy Floro, MD   Brief Narrative:   Patient is a 70 y.o.male with medical history significant for hypertension, atrial fibrillation on Eliquis, mild renal insufficiency, and diverticulitis. Patient was treated conservatively for diverticulitis 2 months ago, with plans for surgical follow-up for possible sigmoid colectomy.  He had a recent admission in January 2022 for diverticulitis with abscess and C. difficile, not amenable to drainage and was discharged home with a 14 day course ofCeftriaxone and Flagyl. Patient reports taking some "left over Amoxicillin"on 03/10/19 for 1 week and then developedsevere, sharp upper abdominal pain which started on 03/18/20. He contacted a telehealth providerand was treated withprednisone.Subsequently, he developed a severe rash on his chest, abdomen, and groin along with severe RLQ abdominal pain. Patientpresented to the ED on 03/19/20 with continued severe abdominal pain, primarilyRLQ and rash.  ED Course: Afebrile and hemodynamically stable. Pertinent labs include leukocytosis to 18,400 and elevated creatinine at 1.54. CT abdomen reveals new pneumoperitoneum likely secondary to prior sigmoid diverticulitis.There are loops of small bowel in the low midline abdomen that demonstrate wall thickening and hyperenhancement felt to be reactive. Surgery was consulted and treatment started with Zosyn, Dilaudid, and Zofran.  Patient was given hydralazine prn once during the night 03/21/20-03/22/20 and once this morning and reported feeling jittery from it. Hydralazine discontinued and patient started on a Cardizem gtt for hypertension.  Early am on 03/23/20, patient went into afib with RVR with HR reaching 150 with palpitation while on a Cardizem gtt at max rate and was given prn metoprolol and was started on continuous IVF's of LR at 125 ml/hr.  Cardiology started patient  on amiodarone gtt on 03/23/20.  Patient still off Cardizem gtt but still on amiodarone gtt, with rate controlled.  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 -Due to lack of clinical improvement, underwent s/p sigmoid colectomy, colostomy and Hartman's pouch formation on 03/23/20. -NG tube in place with intermittent suction, NPO except sips with meds, ice chips -TPN to be started with triple lumen PICC to be placed today. -Pain well controlled on morphine PCA pump.   History of A fib on Eliquis (last dose 2/20) /sinus tachycardia/new onset afib with RVR -During hospital course has mostly been in sinus tachycardia with HR 100-120 -Home medications include  25 mg twice daily of metoprolol tartrate along with Eliquis -Early am on 03/23/20, patient went into afib with RVR with HR reaching 150 with palpitation while on a Cardizem gtt at max rate and was given prn metoprolol and was started on continuous IVF's of LR at 125 ml/hr.   -Cardiology started patient on amiodarone gtt on 03/23/20.   -Patient off Cardizem gtt but still on amiodarone gtt, with rate controlled. -Medications managed by cardiology. -Pt was on heparin gtt, which was discontinued on because of small amount of rectal bleeding which has now resolved. -Cardiology recommends restarting heparin gtt and if no further bleeding episodes, can restart Eliquis tomorrow. -Awaiting surgery for final recommendations.  Hematochezia- bright red blood per rectum. -Heparin was started initially, but stopped due to bright red blood per rectum. -Patient reports  no further episodes of rectal bleeding  today. --Cardiology recommends restarting heparin gtt and if no further bleeding episodes, can restart Eliquis tomorrow. -Awaiting surgery for final recommendations.  CKD stage II, stable -Crt 1.54->1.35->1.20->1.38->1.25 -Continue to monitor.  Hypertension, labile -Metoprololwas changed to 2.5 mgIVq8hrson 3/2, which lowered b/p but did not achieve rate control and hydralazine IV 10 mg q4 hrs, prn for B/P > 160/100was added yesterday -Patient was given hydralazine prn, twice on 03/21/20-03/22/20 and reported feeling jittery from it.  -3/3 Hydralazine discontinued, metoprolol was changed to 2.5 mg IV q8 hrs prn. -Early am on 03/23/20, patient went into afib with RVR with HR reaching 150 with palpitation while on a Cardizem gtt at max rate and was given prn metoprolol and was started on continuous IVF's of LR at 125 ml/hr.   -Cardiology started patient on amiodarone gtt on 03/23/20, rate controlled. -Per cardiology, continue amiodarone gtt today, no plans for conversion to po amiodarone -Start metoprolol IV 2.5 mg q 6 hrs, with hydralazine 10 mg IV q4 hrs for B/P >160/100  DVT prophylaxis: Lovenox Code Status: Full Family Communication:  No family at bedside  Status is: Inpatient  Dispo: The patient is from: Home              Anticipated d/c is to: Home              Patient currently is not medically stable for discharge   Difficult to place patient: No   Body mass index is 26.62 kg/m.   Consultants:   Cardiology  GI  Surgery  Procedures:   03/23/20 colectomy/colostomy   Antimicrobials:   Rocephin, Flagyl, Cefotetan, clindamycin   Subjective:  Patient is s/p sigmoid colectomy, colostomy and Hartman's pouch formation.  Main complaints this am are that his mouth is dry and throat is sore from the procedure and stays that the NG tube is uncomfortable. Pain denies any pain this morning.  Examination:  General exam: Appears calm and  comfortable  Respiratory system: Clear to auscultation. Respiratory effort normal. Cardiovascular system: S1 & S2 heard, regular rate. No JVD, murmurs, rubs, gallops or clicks. No pedal edema. Gastrointestinal system: New colostomy in place LLQ. NG tube in place to intermittent suction. Abdomen is nondistended, soft and nontender. No organomegaly or masses felt. Normal bowel sounds heard. Central nervous system: Alert and oriented. No focal neurological deficits. Extremities: Symmetric 5 x 5 power. Skin: No rashes, lesions or ulcers Psychiatry: Judgement and insight appear normal. Mood & affect appropriate.     Objective: Vitals:   03/24/20 0400 03/24/20 0600 03/24/20 0800 03/24/20 0817  BP:  (!) 169/97 (!) 185/107   Pulse:  92 94   Resp: 10 12 12  (!) 23  Temp: 98.4 F (36.9 C)  98.3 F (36.8 C) 98.3 F (36.8 C)  TempSrc: Axillary  Oral Oral  SpO2: 97% 97% 97%   Weight:      Height:        Intake/Output Summary (Last 24 hours) at 03/24/2020 0959 Last data filed at 03/24/2020 0800 Gross per 24 hour  Intake 5976.14 ml  Output 1815 ml  Net 4161.14 ml   Filed Weights   03/19/20 2359 03/20/20 0952 03/23/20 1221  Weight: 81.6 kg 79.4 kg 79.4 kg     Data Reviewed:   CBC: Recent Labs  Lab 03/20/20 0014 03/21/20 0442 03/22/20 0441 03/23/20 0759 03/24/20 0251  WBC 18.4* 15.5* 12.3* 11.2* 11.0*  NEUTROABS 15.4*  --   --  9.2* 9.8*  HGB 15.7 14.8 14.7 14.2 13.2  HCT 45.9 45.2 44.0 43.4 40.7  MCV 90.9 93.4 92.8 93.9 94.9  PLT 253 200 202 219 211   Basic Metabolic Panel: Recent Labs  Lab 03/20/20 0014 03/21/20 0442 03/22/20 0441 03/23/20 0235 03/24/20 0251  NA 139 136 138 139 135  K 4.2 4.1 3.7 3.4* 4.0  CL 105 104 105 105 107  CO2 23 21* 21* 25 23  GLUCOSE 145* 125* 127* 119* 147*  BUN 27* 25* 23 24* 20  CREATININE 1.54* 1.35* 1.20 1.38* 1.25*  CALCIUM 9.1 8.5* 8.6* 8.6* 7.1*  MG  --  2.0  --   --  1.8  PHOS  --   --   --   --  3.0   GFR: Estimated  Creatinine Clearance: 55.5 mL/min (A) (by C-G formula based on SCr of 1.25 mg/dL (H)). Liver Function Tests: Recent Labs  Lab 03/20/20 0014 03/24/20 0251  AST 16 12*  ALT 25 12  ALKPHOS 65 40  BILITOT 1.4* 0.7  PROT 7.6 4.9*  ALBUMIN 4.3 2.4*   Recent Labs  Lab 03/20/20 0014  LIPASE 37   No results for input(s): AMMONIA in the last 168 hours. Coagulation Profile: Recent Labs  Lab 03/20/20 1328  INR 1.2   Cardiac Enzymes: No results for input(s): CKTOTAL, CKMB, CKMBINDEX, TROPONINI in the last 168 hours. BNP (last 3 results) No results for input(s): PROBNP in the last 8760 hours. HbA1C: Recent Labs    03/23/20 0759  HGBA1C 5.2   CBG: Recent Labs  Lab 03/23/20 2009 03/23/20 2330 03/24/20 0354 03/24/20 0810  GLUCAP 160* 162* 135* 118*   Lipid Profile: Recent Labs    03/24/20 0251  TRIG 61   Thyroid Function Tests: No results for input(s): TSH, T4TOTAL, FREET4, T3FREE, THYROIDAB in the last 72 hours. Anemia Panel: No results for input(s): VITAMINB12, FOLATE, FERRITIN, TIBC, IRON, RETICCTPCT in the last 72 hours. Sepsis Labs: No results for input(s): PROCALCITON, LATICACIDVEN in the last 168 hours.  Recent Results (from the past 240 hour(s))  Resp Panel by RT-PCR (Flu A&B, Covid) Nasopharyngeal Swab     Status: None   Collection Time: 03/20/20  5:42 AM   Specimen: Nasopharyngeal Swab; Nasopharyngeal(NP) swabs in vial transport medium  Result Value Ref Range Status   SARS Coronavirus 2 by RT PCR NEGATIVE NEGATIVE Final    Comment: (NOTE) SARS-CoV-2 target nucleic acids are NOT DETECTED.  The SARS-CoV-2 RNA is generally detectable in upper respiratory specimens during the acute phase of infection. The lowest concentration of SARS-CoV-2 viral copies this assay can detect is 138 copies/mL. A negative result does not preclude SARS-Cov-2 infection and should not be used as the sole basis for treatment or other patient management decisions. A negative result  may occur with  improper specimen collection/handling, submission of specimen other than nasopharyngeal swab, presence of viral mutation(s) within the areas targeted by this assay, and inadequate number of viral copies(<138 copies/mL). A negative result must be combined with clinical observations, patient history, and epidemiological information. The expected result is Negative.  Fact Sheet for Patients:  BloggerCourse.com  Fact Sheet for Healthcare Providers:  SeriousBroker.it  This test is no t yet approved or cleared by the [place] FDA and  has been authorized for detection and/or diagnosis of SARS-CoV-2 by FDA under an Emergency Use Authorization (EUA). This EUA will remain  in effect (meaning this test can be used) for the duration of the COVID-19 declaration under Section 564(b)(1) of the Act, 21 U.S.C.section 360bbb-3(b)(1), unless the  authorization is terminated  or revoked sooner.       Influenza A by PCR NEGATIVE NEGATIVE Final   Influenza B by PCR NEGATIVE NEGATIVE Final    Comment: (NOTE) The Xpert Xpress SARS-CoV-2/FLU/RSV plus assay is intended as an aid in the diagnosis of influenza from Nasopharyngeal swab specimens and should not be used as a sole basis for treatment. Nasal washings and aspirates are unacceptable for Xpert Xpress SARS-CoV-2/FLU/RSV testing.  Fact Sheet for Patients: BloggerCourse.com  Fact Sheet for Healthcare Providers: SeriousBroker.it  This test is not yet approved or cleared by the [place] FDA and has been authorized for detection and/or diagnosis of SARS-CoV-2 by FDA under an Emergency Use Authorization (EUA). This EUA will remain in effect (meaning this test can be used) for the duration of the COVID-19 declaration under Section 564(b)(1) of the Act, 21 U.S.C. section 360bbb-3(b)(1), unless the authorization is terminated  or revoked.  Performed at [facility], [address] [place], [place] [address]   MRSA PCR Screening     Status: None   Collection Time: 03/20/20  9:39 AM   Specimen: Nasal Mucosa; Nasopharyngeal  Result Value Ref Range Status   MRSA by PCR NEGATIVE NEGATIVE Final    Comment:        The GeneXpert MRSA Assay (FDA approved for NASAL specimens only), is one component of a comprehensive MRSA colonization surveillance program. It is not intended to diagnose MRSA infection nor to guide or monitor treatment for MRSA infections. Performed at [facility], [address] [place], [place] [address]          Radiology Studies: [place] EKG SITE RITE  Result Date: 03/23/2020 If Site Rite image not attached, placement could not be confirmed due to current cardiac rhythm.       Scheduled Meds: . Chlorhexidine Gluconate Cloth  6 each Topical Once  . Chlorhexidine Gluconate Cloth  6 each Topical Daily  . insulin aspart  0-9 Units Subcutaneous Q6H  . mouth rinse  15 mL Mouth Rinse BID  . morphine   Intravenous Q4H   Continuous Infusions: . acetaminophen 1,000 mg (03/24/20 0808)  . amiodarone 30 mg/hr (03/24/20 0748)  . cefTRIAXone (ROCEPHIN)  IV 0 g (03/22/20 1241)  . diltiazem (CARDIZEM) infusion Stopped (03/23/20 1310)  . lactated ringers 125 mL/hr at 03/24/20 0748  . lactated ringers    . magnesium sulfate bolus IVPB    . methocarbamol (ROBAXIN) IV Stopped (03/24/20 0254)  . metronidazole Stopped (03/24/20 0223)  . TPN ADULT (ION)       LOS: 4 days   Time spent= 35 mins    Shearon Balo, RN NP-Student   If 7PM-7AM, please contact night-coverage  03/24/2020, 9:59 AM

## 2020-03-25 DIAGNOSIS — I10 Essential (primary) hypertension: Secondary | ICD-10-CM

## 2020-03-25 DIAGNOSIS — I480 Paroxysmal atrial fibrillation: Secondary | ICD-10-CM | POA: Diagnosis not present

## 2020-03-25 DIAGNOSIS — K631 Perforation of intestine (nontraumatic): Secondary | ICD-10-CM | POA: Diagnosis not present

## 2020-03-25 LAB — BASIC METABOLIC PANEL
Anion gap: 6 (ref 5–15)
BUN: 22 mg/dL (ref 8–23)
CO2: 24 mmol/L (ref 22–32)
Calcium: 7.5 mg/dL — ABNORMAL LOW (ref 8.9–10.3)
Chloride: 109 mmol/L (ref 98–111)
Creatinine, Ser: 1.13 mg/dL (ref 0.61–1.24)
GFR, Estimated: >60 mL/min (ref >60)
Glucose, Bld: 131 mg/dL — ABNORMAL HIGH (ref 70–99)
Potassium: 3.8 mmol/L (ref 3.5–5.1)
Sodium: 139 mmol/L (ref 135–145)

## 2020-03-25 LAB — GLUCOSE, CAPILLARY
Glucose-Capillary: 139 mg/dL — ABNORMAL HIGH (ref 70–99)
Glucose-Capillary: 153 mg/dL — ABNORMAL HIGH (ref 70–99)
Glucose-Capillary: 126 mg/dL — ABNORMAL HIGH (ref 70–99)
Glucose-Capillary: 158 mg/dL — ABNORMAL HIGH (ref 70–99)

## 2020-03-25 LAB — HEPARIN LEVEL (UNFRACTIONATED): Heparin Unfractionated: 0.36 IU/mL (ref 0.30–0.70)

## 2020-03-25 LAB — MAGNESIUM: Magnesium: 2.3 mg/dL (ref 1.7–2.4)

## 2020-03-25 LAB — PHOSPHORUS: Phosphorus: 2.1 mg/dL — ABNORMAL LOW (ref 2.5–4.6)

## 2020-03-25 MED ORDER — TRAVASOL 10 % IV SOLN
INTRAVENOUS | Status: AC
  Filled 2020-03-25: qty 748.8

## 2020-03-25 MED ORDER — HYDRALAZINE HCL 20 MG/ML IJ SOLN
10.0000 mg | Freq: Four times a day (QID) | INTRAMUSCULAR | Status: DC
  Administered 2020-03-25 – 2020-03-26 (×4): 10 mg via INTRAVENOUS
  Filled 2020-03-25 (×4): qty 1

## 2020-03-25 MED ORDER — MORPHINE SULFATE (PF) 2 MG/ML IV SOLN
1.0000 mg | INTRAVENOUS | Status: DC | PRN
  Administered 2020-03-26 – 2020-03-27 (×4): 1 mg via INTRAVENOUS
  Filled 2020-03-25 (×4): qty 1

## 2020-03-25 MED ORDER — POTASSIUM PHOSPHATES 15 MMOLE/5ML IV SOLN
20.0000 mmol | Freq: Once | INTRAVENOUS | Status: AC
  Administered 2020-03-25: 20 mmol via INTRAVENOUS
  Filled 2020-03-25: qty 6.67

## 2020-03-25 MED ORDER — SODIUM CHLORIDE 0.9 % IV SOLN: INTRAVENOUS | Status: AC

## 2020-03-25 MED ORDER — CLONIDINE HCL 0.1 MG PO TABS
0.2000 mg | ORAL_TABLET | Freq: Once | ORAL | Status: AC
  Administered 2020-03-25: 0.2 mg via ORAL
  Filled 2020-03-25: qty 1
  Filled 2020-03-25: qty 2

## 2020-03-25 MED ORDER — HEPARIN (PORCINE) 25000 UT/250ML-% IV SOLN
1600.0000 [IU]/h | INTRAVENOUS | Status: DC
  Administered 2020-03-25 – 2020-03-29 (×7): 1600 [IU]/h via INTRAVENOUS
  Filled 2020-03-25 (×7): qty 250

## 2020-03-25 MED ORDER — TRAVASOL 10 % IV SOLN: INTRAVENOUS | Status: DC

## 2020-03-25 MED ORDER — METOPROLOL TARTRATE 5 MG/5ML IV SOLN
5.0000 mg | Freq: Three times a day (TID) | INTRAVENOUS | Status: DC
  Administered 2020-03-25 – 2020-03-28 (×9): 5 mg via INTRAVENOUS
  Filled 2020-03-25 (×9): qty 5

## 2020-03-25 NOTE — Progress Notes (Signed)
Pharmacy - IV heparin  Assessment:    Please see note from Lynann Beaver, PharmD earlier today for full details.  Briefly, 70 y.o. male on IV heparin for Afib while Eliquis on hold perioperatively.    First heparin level after restart therapeutic at 0.36 on 1600 units/hr  No bleeding or infusion issues per RN - previous issues with hematochezia, but appears to have resolved for now  Plan:   Continue heparin at 1600 units/hr  Recheck heparin level with AM labs  Bernadene Person, PharmD, BCPS [phone] 03/25/2020, 4:47 PM

## 2020-03-25 NOTE — Progress Notes (Signed)
ANTICOAGULATION CONSULT NOTE - Initial Consult  Pharmacy Consult for Heparin Indication: atrial fibrillation  Allergies  Allergen Reactions  . Amlodipine Palpitations  . Lisinopril Other (See Comments)    Elevated  creatinine     Patient Measurements: Height: 5\' 8"  (172.7 cm) Weight: 85.4 kg (188 lb 4.4 oz) IBW/kg (Calculated) : 68.4 Heparin Dosing Weight: TBW  Vital Signs: Temp: 98.5 F (36.9 C) (03/06 0806) Temp Source: Oral (03/06 0806) BP: 176/78 (03/06 0900) Pulse Rate: 72 (03/06 0900)  Labs: Recent Labs    03/22/20 1109 03/23/20 0235 03/23/20 0759 03/24/20 0251 03/25/20 0240  HGB  --   --  14.2 13.2  --   HCT  --   --  43.4 40.7  --   PLT  --   --  219 211  --   APTT 58*  --   --   --   --   HEPARINUNFRC 0.38  --   --   --   --   CREATININE  --  1.38*  --  1.25* 1.13    Estimated Creatinine Clearance: 67.5 mL/min (by C-G formula based on SCr of 1.13 mg/dL).   Medical History: Past Medical History:  Diagnosis Date  . CKD (chronic kidney disease), stage III (HCC)   . Diastolic dysfunction    a. 09/2015 Echo: EF 65-70%, no rwma, Gr1 DD, Mod AI. Asc AO 67mm. Mildly dil Asc Ao.  LA 54mm.  50m Dyslipidemia   . Hypertension   . Kidney stones   . Moderate aortic insufficiency    a. 09/2015 Echo: Mod AI.  10/2015 Right bundle branch block   . Testicular hypofunction     Medications:  Scheduled:  . Chlorhexidine Gluconate Cloth  6 each Topical Once  . Chlorhexidine Gluconate Cloth  6 each Topical Daily  . enoxaparin (LOVENOX) injection  40 mg Subcutaneous Daily  . hydrALAZINE  10 mg Intravenous Once  . insulin aspart  0-9 Units Subcutaneous Q6H  . mouth rinse  15 mL Mouth Rinse BID  . metoprolol tartrate  5 mg Intravenous Q8H  . sodium chloride flush  10-40 mL Intracatheter Q12H   Infusions:  . sodium chloride 10 mL/hr at 03/25/20 0600  . amiodarone 30 mg/hr (03/25/20 0600)  . cefTRIAXone (ROCEPHIN)  IV Stopped (03/24/20 1314)  . lactated ringers 85 mL/hr  at 03/25/20 0600  . methocarbamol (ROBAXIN) IV Stopped (03/25/20 0249)  . metronidazole Stopped (03/25/20 0310)  . potassium PHOSPHATE IVPB (in mmol)    . TPN ADULT (ION) 40 mL/hr at 03/25/20 0600    Assessment: 54 yoM admitted with diverticulitis, microperforation.  PMH includes Afib on chronic apixaban anticoagulation which was held on admission and transitioned to Heparin IV pending surgery.  Heparin was held on 3/3 for hematochezia, and he is now s/p ex lap, partial colectomy, colostomy on 3/4.  Prophylactic Lovenox dosing was started on POD1, 3/5.  Pharmacy is now consulted to resume dosing Heparin - no bolus per Surgery.    Today, 03/25/2020: CBC wnl SCr improved to 1.13 No bleeding or complications reported  Goal of Therapy:  Heparin level 0.3-0.7 units/ml aPTT 66-102 seconds Monitor platelets by anticoagulation protocol: Yes   Plan:  No heparin bolus due to recent surgery Start heparin IV infusion at 1600 units/hr Heparin level in 6 hours after starting Daily heparin level and CBC Follow up long-term anticoagulation plans.   05/25/2020 PharmD, BCPS Clinical Pharmacist [facility] main pharmacy [phone] 03/25/2020 9:36 AM

## 2020-03-25 NOTE — Progress Notes (Signed)
2 Days Post-Op   Subjective/Chief Complaint: Pt with no acute changes No bowel fxn TNA Started Sat in chair   Objective: Vital signs in last 24 hours: Temp:  [97.5 F (36.4 C)-98.3 F (36.8 C)] 97.5 F (36.4 C) (03/06 0400) Pulse Rate:  [75-100] 78 (03/06 0700) Resp:  [12-26] 17 (03/06 0700) BP: (144-204)/(75-107) 175/78 (03/06 0700) SpO2:  [91 %-97 %] 91 % (03/06 0700) Weight:  [85.4 kg] 85.4 kg (03/06 0500) Last BM Date: 03/22/20  Intake/Output from previous day: 03/05 0701 - 03/06 0700 In: 4508 [I.V.:3460.1; IV Piggyback:1047.9] Out: 2430 [Urine:2375; Drains:55] Intake/Output this shift: No intake/output data recorded.  General appearance: alert and cooperative GI: soft, non-tender; bowel sounds normal; no masses,  no organomegaly and inc cdi, ostomy pink/patent, bowel sweat  Lab Results:  Recent Labs    03/23/20 0759 03/24/20 0251  WBC 11.2* 11.0*  HGB 14.2 13.2  HCT 43.4 40.7  PLT 219 211   BMET Recent Labs    03/24/20 0251 03/25/20 0240  NA 135 139  K 4.0 3.8  CL 107 109  CO2 23 24  GLUCOSE 147* 131*  BUN 20 22  CREATININE 1.25* 1.13  CALCIUM 7.1* 7.5*   PT/INR No results for input(s): LABPROT, INR in the last 72 hours. ABG No results for input(s): PHART, HCO3 in the last 72 hours.  Invalid input(s): PCO2, PO2  Studies/Results: [place] EKG SITE RITE  Result Date: 03/23/2020 If Site Rite image not attached, placement could not be confirmed due to current cardiac rhythm.   Anti-infectives: Anti-infectives (From admission, onward)   Start     Dose/Rate Route Frequency Ordered Stop   03/23/20 1000  clindamycin (CLEOCIN) 900 mg, gentamicin (GARAMYCIN) 240 mg in sodium chloride 0.9 % 1,000 mL for intraperitoneal lavage  Status:  Discontinued         Irrigation To Surgery 03/23/20 0724 03/23/20 1705   03/23/20 0815  cefoTEtan (CEFOTAN) 2 g in sodium chloride 0.9 % 100 mL IVPB        2 g 200 mL/hr over 30 Minutes Intravenous On call to O.R. 03/23/20  0724 03/23/20 1332   03/20/20 1400  piperacillin-tazobactam (ZOSYN) IVPB 3.375 g  Status:  Discontinued        3.375 g 12.5 mL/hr over 240 Minutes Intravenous Every 8 hours 03/20/20 0623 03/20/20 0636   03/20/20 1200  cefTRIAXone (ROCEPHIN) 2 g in sodium chloride 0.9 % 100 mL IVPB        2 g 200 mL/hr over 30 Minutes Intravenous Every 24 hours 03/20/20 0636     03/20/20 1200  metroNIDAZOLE (FLAGYL) IVPB 500 mg        500 mg 100 mL/hr over 60 Minutes Intravenous Every 8 hours 03/20/20 0636     03/20/20 0530  piperacillin-tazobactam (ZOSYN) IVPB 3.375 g        3.375 g 100 mL/hr over 30 Minutes Intravenous  Once 03/20/20 0516 03/20/20 0175      Assessment/Plan: s/p Procedure(s): EXPLORATORY LAPAROTOMY (N/A) PARTIAL COLECTOMY (N/A) COLOSTOMY (N/A) -clamp NGT today and DC if no n/v -TNA -mobilize  LOS: 5 days    Axel Filler 03/25/2020

## 2020-03-25 NOTE — Progress Notes (Signed)
PCA d/c.  22 mL morphine wasted in stericycle.  Donnita Falls, RN witness.  Ulis Rias, RN

## 2020-03-25 NOTE — Progress Notes (Signed)
[facility]  PROGRESS NOTE  Daniel Aguirre EXB:[id number] DOB: 1950/08/13 DOA: 03/19/2020 PCP: Daisy Floro, MD   Brief HPI:   70 year old male with medical history of hypertension, mild renal insufficiency, atrial fibrillation on Eliquis and admission in early January for diverticulitis with abscess presented to ED with complaints of severe abdominal pain.  Patient was managed conservatively 2 months ago for diverticulitis with abscess that was not amenable to IR drainage.  At that time he was discharged on 4 weeks of outpatient antibiotics with Augmentin.  Patient said that he was doing fine and did developed abdominal pain a week ago, he started taking Augmentin which was left over from previous prescription.  Patient waterbrash on his chest abdominal.  He was seen by telehealth provider and was given prednisone 50 mg for 2 days.  After that patient developed left lower quadrant pain and came to ED for further evaluation. In the ED CT scan of the abdomen pelvis showed new pneumoperitoneum, this was due to previously demonstrated sigmoid diverticulitis.  General surgery was consulted.  Patient started on IV antibiotics including ceftriaxone and Flagyl.    Subjective   Patient seen and examined, pain well controlled with morphine.    Assessment/Plan:     1. Sigmoid diverticulitis with perforation/fistulous tract-s/p sigmoid colectomy, colostomy and Hartman's pouch formation.  Patient was diagnosed with diverticulitis with abscess 2 months ago, at that time abscess was not amenable for drainage per IR.  So patient was treated with IV antibiotics and discharged on p.o. Augmentin.  Patient again developed abdominal pain, came to ED and CT abdomen/pelvis showed pneumoperitoneum.  General surgery was consulted, plan was to initially treat with IV antibiotics including ceftriaxone and Flagyl and observe.  However patient was not progressing well, continue to have abdominal pain and  tenderness.  So plan was to take him for surgery.  Underwent surgery yesterday.   We will discontinue morphine PCA pump and start morphine 1 mg IV every 3 hours as needed.  Ambulate as tolerated. 2. Atrial fibrillation with RVR-patient has history of A. fib and is on Eliquis at home.  He went into A. fib with RVR, initially started on IV metoprolol however blood pressure dropped yesterday and heart rate not controlled.  Patient was switched to IV Cardizem gtt. however despite max dose of Cardizem patient continued to be in A. fib with RVR.  Cardiology was consulted and patient started on IV amiodarone.  Currently he is back in normal sinus rhythm.   Heparin GTT was started however it was stopped after patient developed hematochezia.  Discussed with general surgery, okay to restart heparin today. 3. Hematochezia-patient had small amount of bright red blood per rectum.  Heparin was started initially which was held.  Will restart Eliquis.  Surgery has recommended to restart anticoagulation.  Will restart heparin drip. 4. Hypertension-blood pressure is mildly elevated, will change metoprolol to 5 mg IV every 8 hours scheduled, continue as needed hydralazine 10 mg IV every 4 hours as needed for BP more than 160/100.   5. CKD stage II-creatinine is stable at 1.13     COVID-19 Labs  No results for input(s): DDIMER, FERRITIN, LDH, CRP in the last 72 hours.  Lab Results  Component Value Date   SARSCOV2NAA NEGATIVE 03/20/2020   SARSCOV2NAA NEGATIVE 01/23/2020     Scheduled medications:   . Chlorhexidine Gluconate Cloth  6 each Topical Once  . Chlorhexidine Gluconate Cloth  6 each Topical Daily  . hydrALAZINE  10  mg Intravenous Once  . insulin aspart  0-9 Units Subcutaneous Q6H  . mouth rinse  15 mL Mouth Rinse BID  . metoprolol tartrate  5 mg Intravenous Q8H  . sodium chloride flush  10-40 mL Intracatheter Q12H         CBG: Recent Labs  Lab 03/24/20 1134 03/24/20 1726 03/24/20 1928  03/24/20 2322 03/25/20 0314  GLUCAP 137* 119* 140* 142* 139*    SpO2: 97 % O2 Flow Rate (L/min): 2 L/min FiO2 (%): 100 %    CBC: Recent Labs  Lab 03/20/20 0014 03/21/20 0442 03/22/20 0441 03/23/20 0759 03/24/20 0251  WBC 18.4* 15.5* 12.3* 11.2* 11.0*  NEUTROABS 15.4*  --   --  9.2* 9.8*  HGB 15.7 14.8 14.7 14.2 13.2  HCT 45.9 45.2 44.0 43.4 40.7  MCV 90.9 93.4 92.8 93.9 94.9  PLT 253 200 202 219 211    Basic Metabolic Panel: Recent Labs  Lab 03/21/20 0442 03/22/20 0441 03/23/20 0235 03/24/20 0251 03/25/20 0240  NA 136 138 139 135 139  K 4.1 3.7 3.4* 4.0 3.8  CL 104 105 105 107 109  CO2 21* 21* 25 23 24   GLUCOSE 125* 127* 119* 147* 131*  BUN 25* 23 24* 20 22  CREATININE 1.35* 1.20 1.38* 1.25* 1.13  CALCIUM 8.5* 8.6* 8.6* 7.1* 7.5*  MG 2.0  --   --  1.8 2.3  PHOS  --   --   --  3.0 2.1*     Liver Function Tests: Recent Labs  Lab 03/20/20 0014 03/24/20 0251  AST 16 12*  ALT 25 12  ALKPHOS 65 40  BILITOT 1.4* 0.7  PROT 7.6 4.9*  ALBUMIN 4.3 2.4*     Antibiotics: Anti-infectives (From admission, onward)   Start     Dose/Rate Route Frequency Ordered Stop   03/23/20 1000  clindamycin (CLEOCIN) 900 mg, gentamicin (GARAMYCIN) 240 mg in sodium chloride 0.9 % 1,000 mL for intraperitoneal lavage  Status:  Discontinued         Irrigation To Surgery 03/23/20 0724 03/23/20 1705   03/23/20 0815  cefoTEtan (CEFOTAN) 2 g in sodium chloride 0.9 % 100 mL IVPB        2 g 200 mL/hr over 30 Minutes Intravenous On call to O.R. 03/23/20 0724 03/23/20 1332   03/20/20 1400  piperacillin-tazobactam (ZOSYN) IVPB 3.375 g  Status:  Discontinued        3.375 g 12.5 mL/hr over 240 Minutes Intravenous Every 8 hours 03/20/20 0623 03/20/20 0636   03/20/20 1200  cefTRIAXone (ROCEPHIN) 2 g in sodium chloride 0.9 % 100 mL IVPB        2 g 200 mL/hr over 30 Minutes Intravenous Every 24 hours 03/20/20 0636     03/20/20 1200  metroNIDAZOLE (FLAGYL) IVPB 500 mg        500 mg 100  mL/hr over 60 Minutes Intravenous Every 8 hours 03/20/20 0636     03/20/20 0530  piperacillin-tazobactam (ZOSYN) IVPB 3.375 g        3.375 g 100 mL/hr over 30 Minutes Intravenous  Once 03/20/20 0516 03/20/20 0609       DVT prophylaxis: SCDs  Code Status: Full code  Family Communication: No family at bedside   Consultants:  General surgery  Procedures:      Objective   Vitals:   03/25/20 0700 03/25/20 0800 03/25/20 0806 03/25/20 0900  BP: (!) 175/78 (!) 183/78  (!) 176/78  Pulse: 78 77  72  Resp: 17 17  17 13  Temp:  98.5 F (36.9 C) 98.5 F (36.9 C)   TempSrc:  Oral Oral   SpO2: 91% 95% 97% 97%  Weight:      Height:        Intake/Output Summary (Last 24 hours) at 03/25/2020 1019 Last data filed at 03/25/2020 1000 Gross per 24 hour  Intake 3833.72 ml  Output 2330 ml  Net 1503.72 ml    03/04 1901 - 03/06 0700 In: 6273.8 [I.V.:5125.9] Out: 3030 [Urine:2975; Drains:55]  Filed Weights   03/20/20 0952 03/23/20 1221 03/25/20 0500  Weight: 79.4 kg 79.4 kg 85.4 kg    Physical Examination:   General-appears in no acute distress  Heart-S1-S2, regular, no murmur auscultated  Lungs-clear to auscultation bilaterally, no wheezing or crackles auscultated  Abdomen-soft, nontender, no organomegaly  Extremities-no edema in the lower extremities  Neuro-alert, oriented x3, no focal deficit noted   Status is: Inpatient  Dispo: The patient is from: Home              Anticipated d/c is to: Home              Anticipated d/c date is: 03/26/2020              Patient currently not stable for discharge  Barrier to discharge-ongoing management for diverticulitis abscess with perforation.        Data Reviewed:   Recent Results (from the past 240 hour(s))  Resp Panel by RT-PCR (Flu A&B, Covid) Nasopharyngeal Swab     Status: None   Collection Time: 03/20/20  5:42 AM   Specimen: Nasopharyngeal Swab; Nasopharyngeal(NP) swabs in vial transport medium  Result Value  Ref Range Status   SARS Coronavirus 2 by RT PCR NEGATIVE NEGATIVE Final    Comment: (NOTE) SARS-CoV-2 target nucleic acids are NOT DETECTED.  The SARS-CoV-2 RNA is generally detectable in upper respiratory specimens during the acute phase of infection. The lowest concentration of SARS-CoV-2 viral copies this assay can detect is 138 copies/mL. A negative result does not preclude SARS-Cov-2 infection and should not be used as the sole basis for treatment or other patient management decisions. A negative result may occur with  improper specimen collection/handling, submission of specimen other than nasopharyngeal swab, presence of viral mutation(s) within the areas targeted by this assay, and inadequate number of viral copies(<138 copies/mL). A negative result must be combined with clinical observations, patient history, and epidemiological information. The expected result is Negative.  Fact Sheet for Patients:  BloggerCourse.comhttps://www.fda.gov/media/152166/download  Fact Sheet for Healthcare Providers:  SeriousBroker.ithttps://www.fda.gov/media/152162/download  This test is no t yet approved or cleared by the [place] FDA and  has been authorized for detection and/or diagnosis of SARS-CoV-2 by FDA under an Emergency Use Authorization (EUA). This EUA will remain  in effect (meaning this test can be used) for the duration of the COVID-19 declaration under Section 564(b)(1) of the Act, 21 U.S.C.section 360bbb-3(b)(1), unless the authorization is terminated  or revoked sooner.       Influenza A by PCR NEGATIVE NEGATIVE Final   Influenza B by PCR NEGATIVE NEGATIVE Final    Comment: (NOTE) The Xpert Xpress SARS-CoV-2/FLU/RSV plus assay is intended as an aid in the diagnosis of influenza from Nasopharyngeal swab specimens and should not be used as a sole basis for treatment. Nasal washings and aspirates are unacceptable for Xpert Xpress SARS-CoV-2/FLU/RSV testing.  Fact Sheet for  Patients: BloggerCourse.comhttps://www.fda.gov/media/152166/download  Fact Sheet for Healthcare Providers: SeriousBroker.ithttps://www.fda.gov/media/152162/download  This test is not yet approved or  cleared by the [place] and has been authorized for detection and/or diagnosis of SARS-CoV-2 by FDA under an Emergency Use Authorization (EUA). This EUA will remain in effect (meaning this test can be used) for the duration of the COVID-19 declaration under Section 564(b)(1) of the Act, 21 U.S.C. section 360bbb-3(b)(1), unless the authorization is terminated or revoked.  Performed at [facility], [address], [place], [place] [address]   MRSA PCR Screening     Status: None   Collection Time: 03/20/20  9:39 AM   Specimen: Nasal Mucosa; Nasopharyngeal  Result Value Ref Range Status   MRSA by PCR NEGATIVE NEGATIVE Final    Comment:        The GeneXpert MRSA Assay (FDA approved for NASAL specimens only), is one component of a comprehensive MRSA colonization surveillance program. It is not intended to diagnose MRSA infection nor to guide or monitor treatment for MRSA infections. Performed at [facility], [address], [place], [place] [address]     Recent Labs  Lab 03/20/20 [id number]  LIPASE 37        Gagan S Lama   [facility] If 7PM-7AM, please contact night-coverage at [email or web address], Office  [phone]   03/25/2020, 10:19 AM  LOS: 5 days

## 2020-03-25 NOTE — Progress Notes (Signed)
PROGRESS NOTE    Daniel Aguirre  EYC:[id number] DOB: 06/22/1950 DOA: 03/19/2020 PCP: Daisy Floro, MD   Brief Narrative:   Patient is a 70 y.o.male with medical history significant for hypertension, atrial fibrillation on Eliquis, mild renal insufficiency, and diverticulitis. Patient was treated conservatively for diverticulitis 2 months ago, with plans for surgical follow-up for possible sigmoid colectomy.  He had a recent admission in January 2022 for diverticulitis with abscess and C. difficile, not amenable to drainage and was discharged home with a 14 day course ofCeftriaxone and Flagyl. Patient reports taking some "left over Amoxicillin"on 03/10/19 for 1 week and then developedsevere, sharp upper abdominal pain which started on 03/18/20. He contacted a telehealth providerand was treated withprednisone.Subsequently, he developed a severe rash on his chest, abdomen, and groin along with severe RLQ abdominal pain. Patientpresented to the ED on 03/19/20 with continued severe abdominal pain, primarilyRLQ and rash.  ED Course: Afebrile and hemodynamically stable. Pertinent labs include leukocytosis to 18,400 and elevated creatinine at 1.54. CT abdomen reveals new pneumoperitoneum likely secondary to prior sigmoid diverticulitis.There are loops of small bowel in the low midline abdomen that demonstrate wall thickening and hyperenhancement felt to be reactive. Surgery was consulted and treatment started with Zosyn, Dilaudid, and Zofran.  Patient was given hydralazine prn once during the night 03/21/20-03/22/20 and once this morning and reported feeling jittery from it. Hydralazine discontinued and patient started on a Cardizem gtt for hypertension.  Early am on 03/23/20, patient went into afib with RVR with HR reaching 150 with palpitation while on a Cardizem gtt at max rate and was given prn metoprolol and was started oncontinuousIVF's of LR at 125 ml/hr.  Cardiology started patient on  amiodarone gtt on 03/23/20.  Patient off Cardizem gtt but still on amiodarone gtt.  Rate now controlled but is hypertensive post sigmoid colectomy, colostomy.    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 -Due to lack of clinical improvement,underwent s/p sigmoid colectomy, colostomy and Hartman's pouch formation on 03/23/20. -NG tube in place with intermittent suction, NPO except sips with meds, ice chips -TPN to be started with triple lumen PICC to be placed today. -Pain well controlled, discontinue morphine PCA pump. -Start morphine 1 mg IV q3hrs, prn -Ambulate as tolerated.   History ofA fib on Eliquis(last dose 2/20) /sinus tachycardia/new onset afib with RVR -During hospital course has mostly been in sinus tachycardia with HR 100-120 -Home medications include25 mg twice daily of metoprolol tartrate along with Eliquis -Early am on 03/23/20, patient went into afib with RVR with HR reaching 150 with palpitation while on a Cardizem gtt at max rate and was given prn metoprolol and was started oncontinuousIVF's of LR at 125 ml/hr.   -Cardiology started patient on amiodarone gtt on 03/23/20.   -Patient off Cardizem gtt but still on amiodarone gtt, with rate controlled. -Medications managed by cardiology. -Pt was on heparin gtt, which was discontinuedon because of small amount of rectal bleedingwhich has now resolved.  Cardiology recommends restarting heparin gtt post surgery. -Per surgery, can restart Eliquis today -Per cardiology, recommend stopping amiodarone and restarting diltiazem 240 mg daily and  metoprolol 25 mg BID, once cleared to take medications orally by surgery.  Hematochezia- bright red  blood per rectum. -Heparin was started initially, but stopped due tobright red blood per rectum. -Patient reports no further episodes of rectal bleeding today. -Cardiology recommends restarting heparin gtt and if no further bleeding episodes. -Per surgery, can restart Eliquis today  CKD stage II, stable -Crt 1.54->1.35->1.20->1.38->1.25->1.13 -Continue to monitor.  Hypophosphatemia -Phosphorus 2.1, replete with 20 mmol potassium phosphate, recheck levels.  Hypertension, labile -Metoprololwas changed to 2.5 mgIVq8hrson 3/2, which lowered b/p but did not achieve rate control and hydralazine IV 10 mg q4 hrs, prn for B/P > 160/100was added yesterday -Patient was given hydralazine prn, twice on3/2/22-03/22/20 and reported feeling jittery from it.  -3/3Hydralazine discontinued,metoprolol was changed to 2.5 mg IV q8 hrs prn. -Early am on 03/23/20, patient went into afib with RVR with HR reaching 150 with palpitation while on a Cardizem gtt at max rate and was given prn metoprolol and was started oncontinuousIVF's of LR at 125 ml/hr.   -Cardiology started patient on amiodarone gtt on 03/23/20, rate controlled. -Significant hypertension overnight 03/24/20-03/25/20, metoprolol dose adjusted to metoprolol IV 5 mg q 8 hrs, pt unwilling to take prn hydralazine -Per cardiology, recommend stopping amiodarone and restarting diltiazem 240 mg daily and metoprolol 25 mg BID, once cleared to take medications orally by surgery.   DVT prophylaxis: Lovenox Code Status: Full Family Communication:  No family at bedside  Status is: Inpatient  Dispo: The patient is from: Home  Anticipated d/c is to: Home  Patient currently is not medically stable for discharge              Difficult to place patient: No   Body mass index is 28.63 kg/m.  Consultants:    Cardiology  GI  Surgery  Procedures:   03/23/20 colectomy/colostomy  Antimicrobials:   Rocephin, Flagyl, Cefotetan, clindamycin   Subjective:  Patient is s/p sigmoid colectomy, colostomy and Hartman's pouch formation.  Main complaints this am wanting to get NG tube removed and wanting to get up, out of bed.  Reports pain is controlled today.  Examination:  General exam: Appears calm and comfortable  Respiratory system: Clear to auscultation. Respiratory effort normal. Cardiovascular system: S1 & S2 heard, regular rate. No JVD, murmurs, rubs, gallops or clicks. No pedal edema. Gastrointestinal system: New colostomy in place LLQ no output since surgery. NG tube in place, clamped. Abdomen is nondistended, soft and nontender. No organomegaly or masses felt. Normal bowel sounds heard. Central nervous system: Alert and oriented. No focal neurological deficits. Extremities: Symmetric 5 x 5 power. Skin: No rashes, lesions or ulcers Psychiatry: Judgement and insight appear normal. Mood & affect appropriate.    Objective: Vitals:   03/25/20 0407 03/25/20 0500 03/25/20 0600 03/25/20 0700  BP:  (!) 170/81 (!) 153/78 (!) 175/78  Pulse:  77 77 78  Resp: 16 15 14 17   Temp:      TempSrc:      SpO2: 96% 94% 96% 91%  Weight:  85.4 kg    Height:        Intake/Output Summary (Last 24 hours) at 03/25/2020 05/25/2020 Last data filed at 03/25/2020 0600 Gross per 24 hour  Intake 4508 ml  Output 2430 ml  Net 2078 ml   Filed Weights   03/20/20 0952 03/23/20 1221 03/25/20 0500  Weight: 79.4 kg 79.4 kg 85.4 kg     Data Reviewed:   CBC: Recent Labs  Lab 03/20/20 0014 03/21/20 0442 03/22/20 0441 03/23/20 0759 03/24/20 0251  WBC 18.4* 15.5* 12.3* 11.2* 11.0*  NEUTROABS 15.4*  --   --  9.2* 9.8*  HGB 15.7 14.8 14.7 14.2 13.2  HCT 45.9 45.2 44.0 43.4 40.7  MCV 90.9 93.4 92.8 93.9 94.9  PLT 253 200 202 219 211   Basic Metabolic Panel: Recent Labs  Lab 03/21/20 0442  03/22/20 0441 03/23/20 0235 03/24/20 0251 03/25/20 0240  NA 136 138 139 135 139  K 4.1 3.7 3.4* 4.0 3.8  CL 104 105 105 107 109  CO2 21* 21* 25 23 24   GLUCOSE 125* 127* 119* 147* 131*  BUN 25* 23 24* 20 22  CREATININE 1.35* 1.20 1.38* 1.25* 1.13  CALCIUM 8.5* 8.6* 8.6* 7.1* 7.5*  MG 2.0  --   --  1.8 2.3  PHOS  --   --   --  3.0 2.1*   GFR: Estimated Creatinine Clearance: 67.5 mL/min (by C-G formula based on SCr of 1.13 mg/dL). Liver Function Tests: Recent Labs  Lab 03/20/20 0014 03/24/20 0251  AST 16 12*  ALT 25 12  ALKPHOS 65 40  BILITOT 1.4* 0.7  PROT 7.6 4.9*  ALBUMIN 4.3 2.4*   Recent Labs  Lab 03/20/20 0014  LIPASE 37   No results for input(s): AMMONIA in the last 168 hours. Coagulation Profile: Recent Labs  Lab 03/20/20 1328  INR 1.2   Cardiac Enzymes: No results for input(s): CKTOTAL, CKMB, CKMBINDEX, TROPONINI in the last 168 hours. BNP (last 3 results) No results for input(s): PROBNP in the last 8760 hours. HbA1C: Recent Labs    03/23/20 0759  HGBA1C 5.2   CBG: Recent Labs  Lab 03/24/20 1134 03/24/20 1726 03/24/20 1928 03/24/20 2322 03/25/20 0314  GLUCAP 137* 119* 140* 142* 139*   Lipid Profile: Recent Labs    03/24/20 0251  TRIG 61   Thyroid Function Tests: No results for input(s): TSH, T4TOTAL, FREET4, T3FREE, THYROIDAB in the last 72 hours. Anemia Panel: No results for input(s): VITAMINB12, FOLATE, FERRITIN, TIBC, IRON, RETICCTPCT in the last 72 hours. Sepsis Labs: No results for input(s): PROCALCITON, LATICACIDVEN in the last 168 hours.  Recent Results (from the past 240 hour(s))  Resp Panel by RT-PCR (Flu A&B, Covid) Nasopharyngeal Swab     Status: None   Collection Time: 03/20/20  5:42 AM   Specimen: Nasopharyngeal Swab; Nasopharyngeal(NP) swabs in vial transport medium  Result Value Ref Range Status   SARS Coronavirus 2 by RT PCR NEGATIVE NEGATIVE Final    Comment: (NOTE) SARS-CoV-2 target nucleic acids are NOT  DETECTED.  The SARS-CoV-2 RNA is generally detectable in upper respiratory specimens during the acute phase of infection. The lowest concentration of SARS-CoV-2 viral copies this assay can detect is 138 copies/mL. A negative result does not preclude SARS-Cov-2 infection and should not be used as the sole basis for treatment or other patient management decisions. A negative result may occur with  improper specimen collection/handling, submission of specimen other than nasopharyngeal swab, presence of viral mutation(s) within the areas targeted by this assay, and inadequate number of viral copies(<138 copies/mL). A negative result must be combined with clinical observations, patient history, and epidemiological information. The expected result is Negative.  Fact Sheet for Patients:  BloggerCourse.comhttps://www.fda.gov/media/152166/download  Fact Sheet for Healthcare Providers:  SeriousBroker.ithttps://www.fda.gov/media/152162/download  This test is no t yet approved or cleared by the [place] FDA and  has been authorized for detection and/or diagnosis of SARS-CoV-2 by FDA under an Emergency Use Authorization (EUA). This EUA will remain  in effect (meaning this test can be used) for the duration of the COVID-19 declaration under Section 564(b)(1) of  the Act, 21 U.S.C.section 360bbb-3(b)(1), unless the authorization is terminated  or revoked sooner.       Influenza A by PCR NEGATIVE NEGATIVE Final   Influenza B by PCR NEGATIVE NEGATIVE Final    Comment: (NOTE) The Xpert Xpress SARS-CoV-2/FLU/RSV plus assay is intended as an aid in the diagnosis of influenza from Nasopharyngeal swab specimens and should not be used as a sole basis for treatment. Nasal washings and aspirates are unacceptable for Xpert Xpress SARS-CoV-2/FLU/RSV testing.  Fact Sheet for Patients: BloggerCourse.com  Fact Sheet for Healthcare Providers: SeriousBroker.it  This test is not yet  approved or cleared by the [place] FDA and has been authorized for detection and/or diagnosis of SARS-CoV-2 by FDA under an Emergency Use Authorization (EUA). This EUA will remain in effect (meaning this test can be used) for the duration of the COVID-19 declaration under Section 564(b)(1) of the Act, 21 U.S.C. section 360bbb-3(b)(1), unless the authorization is terminated or revoked.  Performed at [facility], [address], [place], [place] [address]   MRSA PCR Screening     Status: None   Collection Time: 03/20/20  9:39 AM   Specimen: Nasal Mucosa; Nasopharyngeal  Result Value Ref Range Status   MRSA by PCR NEGATIVE NEGATIVE Final    Comment:        The GeneXpert MRSA Assay (FDA approved for NASAL specimens only), is one component of a comprehensive MRSA colonization surveillance program. It is not intended to diagnose MRSA infection nor to guide or monitor treatment for MRSA infections. Performed at [facility], [address] [place], [place] [address]          Radiology Studies: [place] EKG SITE RITE  Result Date: 03/23/2020 If Site Rite image not attached, placement could not be confirmed due to current cardiac rhythm.       Scheduled Meds: . Chlorhexidine Gluconate Cloth  6 each Topical Once  . Chlorhexidine Gluconate Cloth  6 each Topical Daily  . enoxaparin (LOVENOX) injection  40 mg Subcutaneous Daily  . hydrALAZINE  10 mg Intravenous Once  . insulin aspart  0-9 Units Subcutaneous Q6H  . mouth rinse  15 mL Mouth Rinse BID  . metoprolol tartrate  2.5 mg Intravenous Q6H  . morphine   Intravenous Q4H  . sodium chloride flush  10-40 mL Intracatheter Q12H   Continuous Infusions: . sodium chloride 10 mL/hr at 03/25/20 0600  . amiodarone 30 mg/hr (03/25/20 0600)  . cefTRIAXone (ROCEPHIN)  IV Stopped (03/24/20 1314)  . lactated ringers 85 mL/hr at 03/25/20 0600  . methocarbamol (ROBAXIN) IV Stopped (03/25/20  0249)  . metronidazole Stopped (03/25/20 0310)  . TPN ADULT (ION) 40 mL/hr at 03/25/20 0600     LOS: 5 days   Time spent= 35 mins    Shearon Balo, RN, NP Student   If 7PM-7AM, please contact night-coverage  03/25/2020, 7:12 AM

## 2020-03-25 NOTE — Progress Notes (Signed)
Progress Note  Patient Name: Daniel Aguirre Date of Encounter: 03/25/2020  [facility] Cardiologist: Rollene Rotunda, MD   Subjective   Doing fair overall. No flatus yet, remains strict NPO, but pain well controlled.  Inpatient Medications    Scheduled Meds: . Chlorhexidine Gluconate Cloth  6 each Topical Once  . Chlorhexidine Gluconate Cloth  6 each Topical Daily  . hydrALAZINE  10 mg Intravenous Once  . hydrALAZINE  10 mg Intravenous Q6H  . insulin aspart  0-9 Units Subcutaneous Q6H  . mouth rinse  15 mL Mouth Rinse BID  . metoprolol tartrate  5 mg Intravenous Q8H  . sodium chloride flush  10-40 mL Intracatheter Q12H   Continuous Infusions: . sodium chloride 10 mL/hr at 03/25/20 0900  . amiodarone 30 mg/hr (03/25/20 0900)  . cefTRIAXone (ROCEPHIN)  IV Stopped (03/24/20 1314)  . heparin 1,600 Units/hr (03/25/20 1017)  . lactated ringers 85 mL/hr at 03/25/20 0900  . methocarbamol (ROBAXIN) IV Stopped (03/25/20 0249)  . metronidazole 500 mg (03/25/20 1006)  . potassium PHOSPHATE IVPB (in mmol)    . TPN ADULT (ION) 40 mL/hr at 03/25/20 0900  . TPN ADULT (ION)     PRN Meds: sodium chloride, morphine injection, ondansetron (ZOFRAN) IV, sodium chloride flush   Vital Signs    Vitals:   03/25/20 0700 03/25/20 0800 03/25/20 0806 03/25/20 0900  BP: (!) 175/78 (!) 183/78  (!) 176/78  Pulse: 78 77  72  Resp: 17 17 17 13   Temp:  98.5 F (36.9 C) 98.5 F (36.9 C)   TempSrc:  Oral Oral   SpO2: 91% 95% 97% 97%  Weight:      Height:        Intake/Output Summary (Last 24 hours) at 03/25/2020 1057 Last data filed at 03/25/2020 1000 Gross per 24 hour  Intake 3833.72 ml  Output 2330 ml  Net 1503.72 ml   Last 3 Weights 03/25/2020 03/23/2020 03/20/2020  Weight (lbs) 188 lb 4.4 oz 175 lb 0.7 oz 175 lb  Weight (kg) 85.4 kg 79.4 kg 79.379 kg      Telemetry    sinus rhythm - Personally Reviewed  ECG    No new since 3/3 - Personally Reviewed  Physical Exam   GEN: Well  nourished, well developed in no acute distress. NG tube in place. NECK: No JVD CARDIAC: regular rhythm, normal S1 and S2, no rubs or gallops. No murmur. VASCULAR: Radial pulses 2+ bilaterally.  RESPIRATORY:  Clear to auscultation without rales, wheezing or rhonchi  ABDOMEN: Soft, quiet bowel sounds MUSCULOSKELETAL:  Moves all 4 limbs independently SKIN: Warm and dry, no edema NEUROLOGIC:  No focal neuro deficits noted. PSYCHIATRIC:  Normal affect   Labs    High Sensitivity Troponin:  No results for input(s): TROPONINIHS in the last 720 hours.    Chemistry Recent Labs  Lab 03/20/20 0014 03/21/20 0442 03/23/20 0235 03/24/20 0251 03/25/20 0240  NA 139   < > 139 135 139  K 4.2   < > 3.4* 4.0 3.8  CL 105   < > 105 107 109  CO2 23   < > 25 23 24   GLUCOSE 145*   < > 119* 147* 131*  BUN 27*   < > 24* 20 22  CREATININE 1.54*   < > 1.38* 1.25* 1.13  CALCIUM 9.1   < > 8.6* 7.1* 7.5*  PROT 7.6  --   --  4.9*  --   ALBUMIN 4.3  --   --  2.4*  --   AST 16  --   --  12*  --   ALT 25  --   --  12  --   ALKPHOS 65  --   --  40  --   BILITOT 1.4*  --   --  0.7  --   GFRNONAA 49*   < > 56* >60 >60  ANIONGAP 11   < > 9 5 6    < > = values in this interval not displayed.     Hematology Recent Labs  Lab 03/22/20 0441 03/23/20 0759 03/24/20 0251  WBC 12.3* 11.2* 11.0*  RBC 4.74 4.62 4.29  HGB 14.7 14.2 13.2  HCT 44.0 43.4 40.7  MCV 92.8 93.9 94.9  MCH 31.0 30.7 30.8  MCHC 33.4 32.7 32.4  RDW 14.3 14.5 14.5  PLT 202 219 211    BNPNo results for input(s): BNP, PROBNP in the last 168 hours.   DDimer No results for input(s): DDIMER in the last 168 hours.   Radiology    05/24/20 EKG SITE RITE  Result Date: 03/23/2020 If Site Rite image not attached, placement could not be confirmed due to current cardiac rhythm.   Cardiac Studies   No new this admission  Patient Profile     70 y.o. male with PMH paroxysmal atrial fibrillation, hypertension, aortic insufficiency who was seen at  the request of Dr. 79 for atrial fibrillation with RVR  Assessment & Plan    Paroxysmal atrial fibrillation -prior to surgery, was in atrial fibrillation with RVR, difficult to manage due to hypotension -now in sinus rhythm -When he is cleared to take medications orally, would stop amiodarone and restart diltiazem 240 mg daily and metoprolol 25 mg BID. Hopeful this will be in the near future -CHA2DS2/VAS Stroke Risk Points= 2  -if he tolerates heparin drip without bleeding, restart apixaban when clear from surgical perspective and able to take PO medications.   Diverticulitis with colon perforation, s/p sigmoid colectomy and Hartman's pouch -per surgery and primary team  Hypertension: -was hypotensive prior to surgery, has been hypertensive post op -has PRN IV medications ordered, I changed IV hydralazine to standing with hold parameters given continued hypertension  Not addressed today: Aortic regurgitation RBBB, LAFB  [facility] will sign off.   Medication Recommendations:  When able to take oral medications: -stop IV amiodarone -restart home diltiazem and metoprolol -restart apixaban when cleared from surgical perspective Other recommendations (labs, testing, etc):  none Follow up as an outpatient: We will arrange for outpatient cardiology followup with Dr. Sharl Ma or a member of his team.   For questions or updates, please contact [facility] Please consult [email or web address] for contact info under     Signed, Antoine Poche, MD  03/25/2020, 10:57 AM

## 2020-03-25 NOTE — Progress Notes (Signed)
A consult was placed to the IV Therapist for more iv access; pt currently has a TL picc in the R upper arm;  Pt noted to have a hard, red "knot" that is warm to touch near the left ac area; area marked with a pen to monitor closely;  A new PIV was placed well above,  and lateral to that site; see IV flowsheet.

## 2020-03-26 ENCOUNTER — Inpatient Hospital Stay (HOSPITAL_COMMUNITY): Payer: No Typology Code available for payment source

## 2020-03-26 DIAGNOSIS — M7989 Other specified soft tissue disorders: Secondary | ICD-10-CM

## 2020-03-26 DIAGNOSIS — I480 Paroxysmal atrial fibrillation: Secondary | ICD-10-CM | POA: Diagnosis not present

## 2020-03-26 DIAGNOSIS — L538 Other specified erythematous conditions: Secondary | ICD-10-CM

## 2020-03-26 DIAGNOSIS — K631 Perforation of intestine (nontraumatic): Secondary | ICD-10-CM | POA: Diagnosis not present

## 2020-03-26 DIAGNOSIS — K668 Other specified disorders of peritoneum: Secondary | ICD-10-CM | POA: Diagnosis not present

## 2020-03-26 DIAGNOSIS — R609 Edema, unspecified: Secondary | ICD-10-CM

## 2020-03-26 DIAGNOSIS — N182 Chronic kidney disease, stage 2 (mild): Secondary | ICD-10-CM | POA: Diagnosis not present

## 2020-03-26 LAB — GLUCOSE, CAPILLARY
Glucose-Capillary: 134 mg/dL — ABNORMAL HIGH (ref 70–99)
Glucose-Capillary: 130 mg/dL — ABNORMAL HIGH (ref 70–99)
Glucose-Capillary: 144 mg/dL — ABNORMAL HIGH (ref 70–99)
Glucose-Capillary: 141 mg/dL — ABNORMAL HIGH (ref 70–99)

## 2020-03-26 LAB — COMPREHENSIVE METABOLIC PANEL
ALT: 15 U/L (ref 0–44)
AST: 15 U/L (ref 15–41)
Albumin: 2.5 g/dL — ABNORMAL LOW (ref 3.5–5.0)
Alkaline Phosphatase: 50 U/L (ref 38–126)
Anion gap: 11 (ref 5–15)
BUN: 22 mg/dL (ref 8–23)
CO2: 18 mmol/L — ABNORMAL LOW (ref 22–32)
Calcium: 7.7 mg/dL — ABNORMAL LOW (ref 8.9–10.3)
Chloride: 109 mmol/L (ref 98–111)
Creatinine, Ser: 1.03 mg/dL (ref 0.61–1.24)
GFR, Estimated: >60 mL/min (ref >60)
Glucose, Bld: 145 mg/dL — ABNORMAL HIGH (ref 70–99)
Potassium: 3.8 mmol/L (ref 3.5–5.1)
Sodium: 138 mmol/L (ref 135–145)
Total Bilirubin: 0.7 mg/dL (ref 0.3–1.2)
Total Protein: 5.3 g/dL — ABNORMAL LOW (ref 6.5–8.1)

## 2020-03-26 LAB — CBC
HCT: 38.6 % — ABNORMAL LOW (ref 39.0–52.0)
Hemoglobin: 12.6 g/dL — ABNORMAL LOW (ref 13.0–17.0)
MCH: 30.6 pg (ref 26.0–34.0)
MCHC: 32.6 g/dL (ref 30.0–36.0)
MCV: 93.7 fL (ref 80.0–100.0)
Platelets: 211 10*3/uL (ref 150–400)
RBC: 4.12 MIL/uL — ABNORMAL LOW (ref 4.22–5.81)
RDW: 14.5 % (ref 11.5–15.5)
WBC: 11.4 10*3/uL — ABNORMAL HIGH (ref 4.0–10.5)
nRBC: 0 % (ref 0.0–0.2)

## 2020-03-26 LAB — DIFFERENTIAL
Abs Immature Granulocytes: 0.24 10*3/uL — ABNORMAL HIGH (ref 0.00–0.07)
Basophils Absolute: 0.1 10*3/uL (ref 0.0–0.1)
Basophils Relative: 1 %
Eosinophils Absolute: 0.3 10*3/uL (ref 0.0–0.5)
Eosinophils Relative: 2 %
Immature Granulocytes: 2 %
Lymphocytes Relative: 8 %
Lymphs Abs: 0.9 10*3/uL (ref 0.7–4.0)
Monocytes Absolute: 0.8 10*3/uL (ref 0.1–1.0)
Monocytes Relative: 7 %
Neutro Abs: 9.1 10*3/uL — ABNORMAL HIGH (ref 1.7–7.7)
Neutrophils Relative %: 80 %

## 2020-03-26 LAB — PHOSPHORUS: Phosphorus: 3.3 mg/dL (ref 2.5–4.6)

## 2020-03-26 LAB — SURGICAL PATHOLOGY

## 2020-03-26 LAB — HEPARIN LEVEL (UNFRACTIONATED): Heparin Unfractionated: 0.44 IU/mL (ref 0.30–0.70)

## 2020-03-26 LAB — TRIGLYCERIDES: Triglycerides: 83 mg/dL (ref <150)

## 2020-03-26 LAB — PREALBUMIN: Prealbumin: 13.4 mg/dL — ABNORMAL LOW (ref 18–38)

## 2020-03-26 LAB — MAGNESIUM: Magnesium: 2.1 mg/dL (ref 1.7–2.4)

## 2020-03-26 MED ORDER — CLONIDINE HCL 0.1 MG/24HR TD PTWK
0.1000 mg | MEDICATED_PATCH | TRANSDERMAL | Status: DC
  Administered 2020-03-26: 0.1 mg via TRANSDERMAL
  Filled 2020-03-26: qty 1

## 2020-03-26 MED ORDER — TRAVASOL 10 % IV SOLN
INTRAVENOUS | Status: AC
  Filled 2020-03-26: qty 998.4

## 2020-03-26 MED ORDER — NICARDIPINE HCL IN NACL 20-0.86 MG/200ML-% IV SOLN
3.0000 mg/h | INTRAVENOUS | Status: DC
  Administered 2020-03-26: 5 mg/h via INTRAVENOUS
  Administered 2020-03-26: 7.5 mg/h via INTRAVENOUS
  Administered 2020-03-26: 10 mg/h via INTRAVENOUS
  Administered 2020-03-27 – 2020-03-28 (×7): 5 mg/h via INTRAVENOUS
  Filled 2020-03-26 (×13): qty 200

## 2020-03-26 MED ORDER — HYDRALAZINE HCL 20 MG/ML IJ SOLN
10.0000 mg | INTRAMUSCULAR | Status: DC | PRN
  Administered 2020-03-26 (×2): 10 mg via INTRAVENOUS
  Filled 2020-03-26 (×2): qty 1

## 2020-03-26 MED ORDER — BOOST / RESOURCE BREEZE PO LIQD CUSTOM
1.0000 | Freq: Two times a day (BID) | ORAL | Status: DC
  Administered 2020-03-26 – 2020-03-27 (×3): 1 via ORAL

## 2020-03-26 NOTE — Progress Notes (Signed)
Notified Dr. Sharl Ma in regards to high BP 240/90s, despite PRNs and clonidine patch. Verbal for cardene gtt ordered. Confirmed with MD that drip was okay to administer even though contraindication listed. MD confirmed that it was okay to administer

## 2020-03-26 NOTE — Progress Notes (Signed)
PHARMACY - TOTAL PARENTERAL NUTRITION CONSULT NOTE   Indication: Prolonged ileus  Patient Measurements: Height: 5\' 8"  (172.7 cm) Weight: 85.5 kg (188 lb 7.9 oz) IBW/kg (Calculated) : 68.4 TPN AdjBW (KG): 79.4 Body mass index is 28.66 kg/m. 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 Patient was initially treated medically but decision was made for surgical intervention. Patient is now s/p sigmoid colectomy and colostomy with Hartman's pouch on 3/4.  He is now over 5 days with no PO intake for surgery on 3/4, and anticipate prolonged ileus.  He is high risk for refeeding syndrome.    Glucose / Insulin: CBGs 126-158 after starting TPN. 5 units SSI given / 24 hrs. No hx of DM noted. Electrolytes: goal K > 4 and Mg > 2 on amio; CorrCa 8.9.  Bicarb dec 24 >> 18 Renal: SCr 1.03, BUN 22 Hepatic: AST/ALT, Tbili WNL, TG wnl Intake / Output; MIVF: I/O +1.8L yesterday, UOP 2.45L - NG tube placed 3/4 to low intermittent suction w/ Bile/Brown drainage. Small amt of output from ostomy - no mIVF Prealbumin:  12 (3/4) > 8.4 (3/5) > 13.4 (3/7) GI Surgeries / Procedures:  3/4 Sigmoid colectomy and colostomy and Hartman's pouch formation  Central access: Triple lumen PICC placed 3/5 TPN start date: 3/5  Nutritional Goals (per RD recommendation on 3/5): Kcal:  2100-2300, Protein:  120-135 gm, Fluid:  2.1-2.3 L  Goal TPN rate is 100 mL/hr (provides 125g of protein and 7 day average of 2194 kcals per day)  With Lipids (SMOF 22 g/L = 53g) on MWF only d/t national shortage: 2496 kcal/day  Without lipids on TTSS: 1968 kcal/day  Current Nutrition:  NPO and TPN  Plan:  At 18:00: Advance TPN 80 mL/hr at 1800 Electrolytes in TPN: Na 5mEq/L, K 84mEq/L, Ca 5mEq/L, Mg 12mEq/L, and Phos  20 mmol/L. Cl:Ac 1:2 Standard MVI and trace elements to TPN CBGs and sensitive SSI q6h MIVF d/c Monitor  TPN labs on Mon/Thurs. BMET, mg, and phos in AM  4m D  03/26/2020 9:00 AM

## 2020-03-26 NOTE — Progress Notes (Signed)
ANTICOAGULATION CONSULT NOTE - Follow Up Consult  Pharmacy Consult for Heparin Indication: atrial fibrillation  Allergies  Allergen Reactions  . Amlodipine Palpitations  . Lisinopril Other (See Comments)    Elevated  creatinine     Patient Measurements: Height: 5\' 8"  (172.7 cm) Weight: 85.5 kg (188 lb 7.9 oz) IBW/kg (Calculated) : 68.4 Heparin Dosing Weight: TBW  Vital Signs: Temp: 98.2 F (36.8 C) (03/07 0800) Temp Source: Oral (03/07 0800) BP: 184/78 (03/07 0800) Pulse Rate: 80 (03/07 0800)  Labs: Recent Labs    03/24/20 0251 03/25/20 0240 03/25/20 1559 03/26/20 0807  HGB 13.2  --   --  12.6*  HCT 40.7  --   --  38.6*  PLT 211  --   --  211  HEPARINUNFRC  --   --  0.36 0.44  CREATININE 1.25* 1.13  --  1.03    Estimated Creatinine Clearance: 74 mL/min (by C-G formula based on SCr of 1.03 mg/dL).   Assessment: 57 yoM admitted with diverticulitis, microperforation.  PMH includes Afib on chronic apixaban anticoagulation which was held on admission and transitioned to Heparin IV pending surgery.  Heparin was held on 3/3 for hematochezia, and he is now s/p ex lap, partial colectomy, colostomy on 3/4.  Prophylactic Lovenox dosing was started on POD1, 3/5.  Pharmacy is now consulted to resume dosing Heparin - no bolus per Surgery.    03/26/20 8:59 AM   HL 0.44 at goal  CBC stable  No bleeding reported  Goal of Therapy:  Heparin level 0.3-0.7 units/ml Monitor platelets by anticoagulation protocol: Yes   Plan:  Continue heparin infusion at 1600 units/hr Daily HL/CBC  Thank you for allowing pharmacy to participate in this patient's care.    05/26/20 D 03/26/2020,8:57 AM

## 2020-03-26 NOTE — Progress Notes (Signed)
VASCULAR LAB    Left upper extremity venous duplex has been performed.  See CV proc for preliminary results.  Messaged results to Dr. Sharl Ma via secure chat.  KANADY, CANDACE, RVT 03/26/2020, 9:42 AM

## 2020-03-26 NOTE — Progress Notes (Signed)
Patient ID: Daniel Aguirre, male   DOB: [birth year], 70 y.o.   MRN: [id number]    3 Days Post-Op  Subjective: Very comfortable this morning.  No nausea.  Doesn't feel bloated.  Ostomy starting to work well.  Got up to chair twice yesterday.  ROS: See above, otherwise other systems negative  Objective: Vital signs in last 24 hours: Temp:  [98.2 F (36.8 C)-99.7 F (37.6 C)] 99.7 F (37.6 C) (03/07 0400) Pulse Rate:  [64-88] 64 (03/07 0600) Resp:  [13-26] 16 (03/07 0600) BP: (121-212)/(63-93) 145/68 (03/07 0600) SpO2:  [92 %-97 %] 95 % (03/07 0600) FiO2 (%):  [100 %] 100 % (03/06 0806) Weight:  [85.5 kg] 85.5 kg (03/07 0500) Last BM Date: 03/22/20  Intake/Output from previous day: 03/06 0701 - 03/07 0700 In: 4510.5 [I.V.:3660.1; IV Piggyback:850.3] Out: 2730 [Urine:2450; Drains:130; Stool:150] Intake/Output this shift: No intake/output data recorded.  PE: Abd: feels tight with some tympany, especially over his stomach.  Midline wound is clean and packed.  LUQ colostomy with enteric output present.  Stoma is pink and viable.  JP drain with minimal serosang output  Lab Results:  Recent Labs    03/24/20 0251  WBC 11.0*  HGB 13.2  HCT 40.7  PLT 211   BMET Recent Labs    03/24/20 0251 03/25/20 0240  NA 135 139  K 4.0 3.8  CL 107 109  CO2 23 24  GLUCOSE 147* 131*  BUN 20 22  CREATININE 1.25* 1.13  CALCIUM 7.1* 7.5*   PT/INR No results for input(s): LABPROT, INR in the last 72 hours. CMP     Component Value Date/Time   NA 139 03/25/2020 0240   K 3.8 03/25/2020 0240   CL 109 03/25/2020 0240   CO2 24 03/25/2020 0240   GLUCOSE 131 (H) 03/25/2020 0240   BUN 22 03/25/2020 0240   CREATININE 1.13 03/25/2020 0240   CALCIUM 7.5 (L) 03/25/2020 0240   PROT 4.9 (L) 03/24/2020 0251   ALBUMIN 2.4 (L) 03/24/2020 0251   AST 12 (L) 03/24/2020 0251   ALT 12 03/24/2020 0251   ALKPHOS 40 03/24/2020 0251   BILITOT 0.7 03/24/2020 0251   GFRNONAA >60 03/25/2020 0240    GFRAA 39 (L) 09/24/2019 2327   Lipase     Component Value Date/Time   LIPASE 37 03/20/2020 0014       Studies/Results: No results found.  Anti-infectives: Anti-infectives (From admission, onward)   Start     Dose/Rate Route Frequency Ordered Stop   03/23/20 1000  clindamycin (CLEOCIN) 900 mg, gentamicin (GARAMYCIN) 240 mg in sodium chloride 0.9 % 1,000 mL for intraperitoneal lavage  Status:  Discontinued         Irrigation To Surgery 03/23/20 0724 03/23/20 1705   03/23/20 0815  cefoTEtan (CEFOTAN) 2 g in sodium chloride 0.9 % 100 mL IVPB        2 g 200 mL/hr over 30 Minutes Intravenous On call to O.R. 03/23/20 0724 03/23/20 1332   03/20/20 1400  piperacillin-tazobactam (ZOSYN) IVPB 3.375 g  Status:  Discontinued        3.375 g 12.5 mL/hr over 240 Minutes Intravenous Every 8 hours 03/20/20 0623 03/20/20 0636   03/20/20 1200  cefTRIAXone (ROCEPHIN) 2 g in sodium chloride 0.9 % 100 mL IVPB        2 g 200 mL/hr over 30 Minutes Intravenous Every 24 hours 03/20/20 0636     03/20/20 1200  metroNIDAZOLE (FLAGYL) IVPB 500 mg  500 mg 100 mL/hr over 60 Minutes Intravenous Every 8 hours 03/20/20 0636     03/20/20 0530  piperacillin-tazobactam (ZOSYN) IVPB 3.375 g        3.375 g 100 mL/hr over 30 Minutes Intravenous  Once 03/20/20 0516 03/20/20 0609       Assessment/Plan Atrial fibrillation - heparin gtt and amio gtt curently Hypertension Mild renal insufficiency  POD 3, s/p Hartman's by Dr. 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  Will try some sips of clears from the floor today -continue IS and pulm toilet -mobilize -cont abx therapy -BID dressing changes to midline wound -cont JP drain -WOC consult for new colostomy teaching  HWY:SHUO of clears/NPO/TNA ID: Rocephin/Flagyl DVT: SCDs; heparin gtt   LOS: 6 days    Letha Cape ,  [facility] 03/26/2020, 8:03 AM Please see Amion for pager number during day hours 7:00am-4:30pm or 7:00am -11:30am on weekends

## 2020-03-26 NOTE — TOC Progression Note (Signed)
Transition of Care [facility]) - Progression Note    Patient Details  Name: Daniel Aguirre MRN: [id number] Date of Birth: 1950/08/13  [facility] CM/SW Contact  Golda Acre, RN Phone Number: 03/26/2020, 8:11 AM  Clinical Narrative:    Assessment/Plan Atrial fibrillation - heparin gtt and amio gtt curently Hypertension Mild renal insufficiency  POD 3, s/p Hartman's by Dr. 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  Will try some sips of clears from the floor today -continue IS and pulm toilet -mobilize -cont abx therapy -BID dressing changes to midline wound -cont JP drain -WOC consult for new colostomy teaching PLAN: to return to home, will follow for hhc needs.  Expected Discharge Plan: Home/Self Care Barriers to Discharge: Continued Medical Work up  Expected Discharge Plan and Services Expected Discharge Plan: Home/Self Care   Discharge Planning Services: CM Consult   Living arrangements for the past 2 months: Single Family Home                                       Social Determinants of Health (SDOH) Interventions    Readmission Risk Interventions No flowsheet data found.

## 2020-03-26 NOTE — Progress Notes (Signed)
[facility]  PROGRESS NOTE  Daniel Aguirre TGY:[id number] DOB: 1950-07-19 DOA: 03/19/2020 PCP: Daisy Floro, MD   Brief HPI:   71 year old male with medical history of hypertension, mild renal insufficiency, atrial fibrillation on Eliquis and admission in early January for diverticulitis with abscess presented to ED with complaints of severe abdominal pain.  Patient was managed conservatively 2 months ago for diverticulitis with abscess that was not amenable to IR drainage.  At that time he was discharged on 4 weeks of outpatient antibiotics with Augmentin.  Patient said that he was doing fine and did developed abdominal pain a week ago, he started taking Augmentin which was left over from previous prescription.  Patient waterbrash on his chest abdominal.  He was seen by telehealth provider and was given prednisone 50 mg for 2 days.  After that patient developed left lower quadrant pain and came to ED for further evaluation. In the ED CT scan of the abdomen pelvis showed new pneumoperitoneum, this was due to previously demonstrated sigmoid diverticulitis.  General surgery was consulted.  Patient started on IV antibiotics including ceftriaxone and Flagyl.    Subjective   Patient seen and examined, complains of pain and swelling in the left upper extremity.   Assessment/Plan:     1. Sigmoid diverticulitis with perforation/fistulous tract-s/p sigmoid colectomy, colostomy and Hartman's pouch formation.  Patient was diagnosed with diverticulitis with abscess 2 months ago, at that time abscess was not amenable for drainage per IR.  So patient was treated with IV antibiotics and discharged on p.o. Augmentin.  Patient again developed abdominal pain, came to ED and CT abdomen/pelvis showed pneumoperitoneum.  General surgery was consulted, plan was to initially treat with IV antibiotics including ceftriaxone and Flagyl and observe.  However patient was not progressing well, continue to have  abdominal pain and tenderness.  So plan was to take him for surgery.  Underwent surgery yesterday , morphine PCA pump was discontinued.  Patient started on TNA per pharmacy. 2. Left upper extremity swelling-venous duplex of left upper extremity was obtained which only showed superficial thrombosis of basilic vein.  Does not show DVT. 3. Atrial fibrillation with RVR-patient has history of A. fib and is on Eliquis at home.  He went into A. fib with RVR, initially started on IV metoprolol however blood pressure dropped yesterday and heart rate not controlled.  Patient was switched to IV Cardizem gtt. however despite max dose of Cardizem patient continued to be in A. fib with RVR.  Cardiology was consulted and patient started on IV amiodarone.  Currently he is back in normal sinus rhythm.   Heparin GTT was started however it was stopped after patient developed hematochezia.  Discussed with general surgery, okay to restart heparin today.  Cardiology recommends to stop IV amiodarone once patient is taking p.o. and restart home medications including metoprolol and Cardizem. 4. Hematochezia-patient had small amount of bright red blood per rectum.  Heparin was started initially which was held.  Will restart Eliquis.  Surgery has recommended to restart anticoagulation.  Heparin drip has been restarted. 5. Hypertension-blood pressure is mildly elevated, will change metoprolol to 5 mg IV every 8 hours scheduled, continue as needed hydralazine 10 mg IV every 4 hours as needed for BP more than 160/100.   6. CKD stage II-creatinine is stable at 1.13     COVID-19 Labs  No results for input(s): DDIMER, FERRITIN, LDH, CRP in the last 72 hours.  Lab Results  Component Value Date  SARSCOV2NAA NEGATIVE 03/20/2020   SARSCOV2NAA NEGATIVE 01/23/2020     Scheduled medications:   . Chlorhexidine Gluconate Cloth  6 each Topical Once  . Chlorhexidine Gluconate Cloth  6 each Topical Daily  . feeding supplement  1  Container Oral BID BM  . hydrALAZINE  10 mg Intravenous Once  . insulin aspart  0-9 Units Subcutaneous Q6H  . mouth rinse  15 mL Mouth Rinse BID  . metoprolol tartrate  5 mg Intravenous Q8H  . sodium chloride flush  10-40 mL Intracatheter Q12H         CBG: Recent Labs  Lab 03/25/20 0314 03/25/20 1237 03/25/20 1806 03/25/20 2316 03/26/20 0534  GLUCAP 139* 153* 126* 158* 134*    SpO2: 98 % O2 Flow Rate (L/min): 2 L/min FiO2 (%): 100 %    CBC: Recent Labs  Lab 03/20/20 0014 03/21/20 0442 03/22/20 0441 03/23/20 0759 03/24/20 0251 03/26/20 0807  WBC 18.4* 15.5* 12.3* 11.2* 11.0* 11.4*  NEUTROABS 15.4*  --   --  9.2* 9.8* 9.1*  HGB 15.7 14.8 14.7 14.2 13.2 12.6*  HCT 45.9 45.2 44.0 43.4 40.7 38.6*  MCV 90.9 93.4 92.8 93.9 94.9 93.7  PLT 253 200 202 219 211 211    Basic Metabolic Panel: Recent Labs  Lab 03/21/20 0442 03/22/20 0441 03/23/20 0235 03/24/20 0251 03/25/20 0240 03/26/20 0807  NA 136 138 139 135 139 138  K 4.1 3.7 3.4* 4.0 3.8 3.8  CL 104 105 105 107 109 109  CO2 21* 21* 25 23 24  18*  GLUCOSE 125* 127* 119* 147* 131* 145*  BUN 25* 23 24* 20 22 22   CREATININE 1.35* 1.20 1.38* 1.25* 1.13 1.03  CALCIUM 8.5* 8.6* 8.6* 7.1* 7.5* 7.7*  MG 2.0  --   --  1.8 2.3 2.1  PHOS  --   --   --  3.0 2.1* 3.3     Liver Function Tests: Recent Labs  Lab 03/20/20 0014 03/24/20 0251 03/26/20 0807  AST 16 12* 15  ALT 25 12 15   ALKPHOS 65 40 50  BILITOT 1.4* 0.7 0.7  PROT 7.6 4.9* 5.3*  ALBUMIN 4.3 2.4* 2.5*     Antibiotics: Anti-infectives (From admission, onward)   Start     Dose/Rate Route Frequency Ordered Stop   03/23/20 1000  clindamycin (CLEOCIN) 900 mg, gentamicin (GARAMYCIN) 240 mg in sodium chloride 0.9 % 1,000 mL for intraperitoneal lavage  Status:  Discontinued         Irrigation To Surgery 03/23/20 0724 03/23/20 1705   03/23/20 0815  cefoTEtan (CEFOTAN) 2 g in sodium chloride 0.9 % 100 mL IVPB        2 g 200 mL/hr over 30 Minutes  Intravenous On call to O.R. 03/23/20 0724 03/23/20 1332   03/20/20 1400  piperacillin-tazobactam (ZOSYN) IVPB 3.375 g  Status:  Discontinued        3.375 g 12.5 mL/hr over 240 Minutes Intravenous Every 8 hours 03/20/20 0623 03/20/20 0636   03/20/20 1200  cefTRIAXone (ROCEPHIN) 2 g in sodium chloride 0.9 % 100 mL IVPB        2 g 200 mL/hr over 30 Minutes Intravenous Every 24 hours 03/20/20 0636     03/20/20 1200  metroNIDAZOLE (FLAGYL) IVPB 500 mg        500 mg 100 mL/hr over 60 Minutes Intravenous Every 8 hours 03/20/20 0636     03/20/20 0530  piperacillin-tazobactam (ZOSYN) IVPB 3.375 g        3.375 g 100  mL/hr over 30 Minutes Intravenous  Once 03/20/20 0516 03/20/20 0609       DVT prophylaxis: SCDs  Code Status: Full code  Family Communication: No family at bedside   Consultants:  General surgery  Procedures:      Objective   Vitals:   03/26/20 0500 03/26/20 0600 03/26/20 0800 03/26/20 1028  BP: (!) 186/77 (!) 145/68 (!) 184/78 (!) 203/63  Pulse: 77 64 80 84  Resp: (!) 22 16 19 20   Temp:   98.2 F (36.8 C)   TempSrc:   Oral   SpO2: 92% 95% 94% 98%  Weight: 85.5 kg     Height:        Intake/Output Summary (Last 24 hours) at 03/26/2020 1124 Last data filed at 03/26/2020 1000 Gross per 24 hour  Intake 4332.93 ml  Output 2365 ml  Net 1967.93 ml    03/05 1901 - 03/07 0700 In: 6201.1 [I.V.:5201.8] Out: 4160 [Urine:3825; Drains:185]  Filed Weights   03/23/20 1221 03/25/20 0500 03/26/20 0500  Weight: 79.4 kg 85.4 kg 85.5 kg    Physical Examination:  General-appears in no acute distress Heart-S1-S2, regular, grade 3/6 diastolic murmur at the aortic area and mitral area, Lungs-clear to auscultation bilaterally, no wheezing or crackles auscultated Abdomen-soft, nontender, no organomegaly Extremities-left upper extremity is edematous, mild erythema noted at the antecubital fossa Neuro-alert, oriented x3, no focal deficit noted  Status is:  Inpatient  Dispo: The patient is from: Home              Anticipated d/c is to: Home              Anticipated d/c date is: 03/29/2020              Patient currently not stable for discharge  Barrier to discharge-ongoing management for diverticulitis abscess with perforation.        Data Reviewed:   Recent Results (from the past 240 hour(s))  Resp Panel by RT-PCR (Flu A&B, Covid) Nasopharyngeal Swab     Status: None   Collection Time: 03/20/20  5:42 AM   Specimen: Nasopharyngeal Swab; Nasopharyngeal(NP) swabs in vial transport medium  Result Value Ref Range Status   SARS Coronavirus 2 by RT PCR NEGATIVE NEGATIVE Final    Comment: (NOTE) SARS-CoV-2 target nucleic acids are NOT DETECTED.  The SARS-CoV-2 RNA is generally detectable in upper respiratory specimens during the acute phase of infection. The lowest concentration of SARS-CoV-2 viral copies this assay can detect is 138 copies/mL. A negative result does not preclude SARS-Cov-2 infection and should not be used as the sole basis for treatment or other patient management decisions. A negative result may occur with  improper specimen collection/handling, submission of specimen other than nasopharyngeal swab, presence of viral mutation(s) within the areas targeted by this assay, and inadequate number of viral copies(<138 copies/mL). A negative result must be combined with clinical observations, patient history, and epidemiological information. The expected result is Negative.  Fact Sheet for Patients:  BloggerCourse.comhttps://www.fda.gov/media/152166/download  Fact Sheet for Healthcare Providers:  SeriousBroker.ithttps://www.fda.gov/media/152162/download  This test is no t yet approved or cleared by the [place] FDA and  has been authorized for detection and/or diagnosis of SARS-CoV-2 by FDA under an Emergency Use Authorization (EUA). This EUA will remain  in effect (meaning this test can be used) for the duration of the COVID-19 declaration under  Section 564(b)(1) of the Act, 21 U.S.C.section 360bbb-3(b)(1), unless the authorization is terminated  or revoked sooner.  Influenza A by PCR NEGATIVE NEGATIVE Final   Influenza B by PCR NEGATIVE NEGATIVE Final    Comment: (NOTE) The Xpert Xpress SARS-CoV-2/FLU/RSV plus assay is intended as an aid in the diagnosis of influenza from Nasopharyngeal swab specimens and should not be used as a sole basis for treatment. Nasal washings and aspirates are unacceptable for Xpert Xpress SARS-CoV-2/FLU/RSV testing.  Fact Sheet for Patients: BloggerCourse.com  Fact Sheet for Healthcare Providers: SeriousBroker.it  This test is not yet approved or cleared by the [place] FDA and has been authorized for detection and/or diagnosis of SARS-CoV-2 by FDA under an Emergency Use Authorization (EUA). This EUA will remain in effect (meaning this test can be used) for the duration of the COVID-19 declaration under Section 564(b)(1) of the Act, 21 U.S.C. section 360bbb-3(b)(1), unless the authorization is terminated or revoked.  Performed at [facility], [address], [place], [place] [address]   MRSA PCR Screening     Status: None   Collection Time: 03/20/20  9:39 AM   Specimen: Nasal Mucosa; Nasopharyngeal  Result Value Ref Range Status   MRSA by PCR NEGATIVE NEGATIVE Final    Comment:        The GeneXpert MRSA Assay (FDA approved for NASAL specimens only), is one component of a comprehensive MRSA colonization surveillance program. It is not intended to diagnose MRSA infection nor to guide or monitor treatment for MRSA infections. Performed at [facility] [address], [place], [place] [address]     Recent Labs  Lab 03/20/20 [phone]  LIPASE 37        Daniel Aguirre   [facility] If 7PM-7AM, please contact night-coverage at [email or web address], Office   [phone]   03/26/2020, 11:24 AM  LOS: 6 days

## 2020-03-26 NOTE — Progress Notes (Signed)
03/26/2020 Page sent to [facility] coverage at 1933 for treatment of elevated BP with no prn orders. 2nd page sent following repeat BP measurement at 2000. Orders received for clonidine. Cindy S. Clelia Croft BSN, RN, CCRP 03/26/2020 12:41 AM

## 2020-03-26 NOTE — Progress Notes (Signed)
PROGRESS NOTE    Daniel Aguirre  IEP:[id number] DOB: 17-Dec-1950 DOA: 03/19/2020 PCP: Daisy Floro, MD   Brief Narrative:   Patient is a 70 y.o.male with medical history significant for hypertension, atrial fibrillation on Eliquis, mild renal insufficiency, and diverticulitis. Patient was treated conservatively for diverticulitis 2 months ago, with plans for surgical follow-up for possible sigmoid colectomy.  He had a recent admission in January 2022 for diverticulitis with abscess and C. difficile, not amenable to drainage and was discharged home with a 14 day course ofCeftriaxone and Flagyl. Patient reports taking some "left over Amoxicillin"on 03/10/19 for 1 week and then developedsevere, sharp upper abdominal pain which started on 03/18/20. He contacted a telehealth providerand was treated withprednisone.Subsequently, he developed a severe rash on his chest, abdomen, and groin along with severe RLQ abdominal pain. Patientpresented to the ED on 03/19/20 with continued severe abdominal pain, primarilyRLQ and rash.  ED Course: Afebrile and hemodynamically stable. Pertinent labs include leukocytosis to 18,400 and elevated creatinine at 1.54. CT abdomen reveals new pneumoperitoneum likely secondary to prior sigmoid diverticulitis.There are loops of small bowel in the low midline abdomen that demonstrate wall thickening and hyperenhancement felt to be reactive. Surgery was consulted and treatment started with Zosyn, Dilaudid, and Zofran.  Patient was given hydralazine prn once during the night 03/21/20-03/22/20 and once this morning and reported feeling jittery from it. Hydralazine discontinued and patient started on a Cardizem gtt for hypertension.  Earlyam on 03/23/20, patient went into afib with RVR with HR reaching 150 with palpitationwhile on aCardizem gtt at max rate and was given prn metoprolol and wasstarted oncontinuousIVF's of LR at 125 ml/hr.Cardiologystarted patient on  amiodarone gtt on 03/23/20. Patient off Cardizem gtt but still on amiodarone gtt.  Rate now controlled but is hypertensive post sigmoid colectomy, colostomy.   NG tube was removed yesterday 03/25/20 and there is a small amount of output in the colostomy bag.  Pt continues to be on TPN post surgery is still NPO with a trial of small sips of clear liquids from floor stock.  B/P remains elevated with catapres given once during the night on 3/6-3/7 and with hydralazine 10 mg IV q4hr prn for B/P >160/100.   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 -Due to lack of clinical improvement,underwents/p sigmoid colectomy, colostomy and Hartman's pouch formationon 03/23/20. -Continue TPN with triple lumen PICC. -Continue morphine 1 mg IV q3hrs, prn -NG tube removed 03/25/20, NPO with trial today of a few sips of clears from the floor, stop if nausea develops.  History ofA fib on Eliquis(last dose 2/20) /sinus tachycardia/new onset afib with RVR -During hospital course has mostly been in sinus tachycardia with HR 100-120 -Home medications include25 mg twice daily of metoprolol tartrate along with Eliquis -Earlyam on 03/23/20, patient went into afib with RVR with HR reaching 150 with palpitationwhile on aCardizem gtt at max rate and was given prn metoprolol and wasstarted oncontinuousIVF's of LR at 125 ml/hr.  -Cardiologystarted patient on amiodarone gtt on 03/23/20. -Patient off Cardizem gtt but still on amiodarone gtt, with rate controlled. -Medications managed by cardiology. -Pt was on heparin gtt, which  was discontinuedonbecause of small amount of rectal bleedingwhich has now resolved.  Cardiology recommends restarting heparin gtt post  surgery. -Per cardiology, may restart Eliquis -Per cardiology, recommend stopping amiodarone and restarting diltiazem 240 mg daily and metoprolol 25 mg BID, once cleared to take medications orally by surgery. -Start catapres 0.1 mg patch, change patch q7days  Hematochezia- bright red blood per rectum. -Heparin was started initially, but stopped due tobright red blood per rectum. -Patient reports no further episodes of rectal bleeding today. -Cardiology recommends restarting heparin gtt and if no further bleeding episodes can restart Eliquis  CKD stage II, stable -Crt 1.54->1.35->1.20->1.38->1.25->1.13->1.03 -Continue to monitor.  Hypophosphatemia -Phosphorus 2.1, replete with 20 mmol potassium phosphate, normalized at 3.3.  Hypertension, labile -Metoprololwas changed to 2.5 mgIVq8hrson 3/2, which lowered b/p but did not achieve rate control and hydralazine IV 10 mg q4 hrs, prn for B/P > 160/100was added yesterday -Patient was given hydralazine prn, twice on3/2/22-03/22/20 and reported feeling jittery from it.  -3/3Hydralazine discontinued,metoprolol was changed to 2.5 mg IV q8 hrs prn. -Earlyam on 03/23/20, patient went into afib with RVR with HR reaching 150 with palpitationwhile on aCardizem gtt at max rate and was given prn metoprolol and wasstarted oncontinuousIVF's of LR at 125 ml/hr.  -Cardiologystarted patient on amiodarone gtt on 03/23/20, rate controlled. -Significant hypertension overnight 03/24/20-03/25/20, metoprolol dose adjusted to metoprolol IV 5 mg q 8 hrs, pt unwilling to take prn hydralazine -Per cardiology, will stop amiodarone and restart diltiazem 240 mg daily and metoprolol 25 mg BID, once cleared to take medications orally by surgery.  -Significant hypertension of up to 200 SBP O/N 3/6-3/7, 0.2 mg catapres given with  hydralazine 10 mg IV q4hr prn for B/P >160/100. -Start catapres 0.1 mg patch, change patch q7days  Rule out DVT, upper left arm edema and swelling DVT versus IV infiltration -Upper Extremity vascular U/S ordered with findings: Left: No evidence of deep vein thrombosis in the upper extremity. Findings  consistent with acute superficial vein thrombosis involving the left basilic vein.  Basilic thrombus noted from proximal forearm to just above AC.  -No intervention at this time, ice if needed for comfort measures.    DVT prophylaxis:Lovenox Code Status:Full Family Communication:No family at bedside  Status is: Inpatient  Dispo: The patient is from:Home Anticipated d/c is AV:WUJWto:Home Patient currently is not medically stable for discharge Difficult to place patient: No  Body mass index is 28.66 kg/m.  Consultants:  Cardiology  GI  Surgery  Procedures:  03/23/20 colectomy/colostomy  Antimicrobials:  Rocephin, Flagyl, Cefotetan, clindamycin  Examination:  General exam:Appears calm and comfortable  Respiratory system: Clear to auscultation. Respiratory effort normal. Cardiovascular system:S1 &S2 heard,regular rate, systolic murmur heard.No JVD No pedal edema. 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 Small amount of output since surgery. NG tube removed yesterday.  Abdomen is nondistended, soft and nontender. No organomegaly or masses felt. Normal bowel sounds heard. Central nervous system:Alert and oriented. No focal neurological deficits. Extremities: Symmetric 5 x 5 power. Skin: Upper left arm edema and swelling near peripheral IV site. Psychiatry:Judgement and insight appear normal. Mood &affect appropriate.    Objective: Vitals:   03/26/20 0500 03/26/20 0600 03/26/20 0800 03/26/20 1028  BP: (!) 186/77 (!) 145/68 (!) 184/78 (!) 203/63  Pulse: 77 64 80 84  Resp: (!) 22 16 19 20   Temp:   98.2  F (36.8 C)   TempSrc:   Oral   SpO2: 92% 95% 94% 98%  Weight: 85.5 kg     Height:        Intake/Output Summary (Last 24 hours) at 03/26/2020 1050 Last data filed at 03/26/2020  1000 Gross per 24 hour  Intake 4660.92 ml  Output 2465 ml  Net 2195.92 ml   Filed Weights   03/23/20 1221 03/25/20 0500 03/26/20 0500  Weight: 79.4 kg 85.4 kg 85.5 kg     Data Reviewed:   CBC: Recent Labs  Lab 03/20/20 0014 03/21/20 0442 03/22/20 0441 03/23/20 0759 03/24/20 0251 03/26/20 0807  WBC 18.4* 15.5* 12.3* 11.2* 11.0* 11.4*  NEUTROABS 15.4*  --   --  9.2* 9.8* 9.1*  HGB 15.7 14.8 14.7 14.2 13.2 12.6*  HCT 45.9 45.2 44.0 43.4 40.7 38.6*  MCV 90.9 93.4 92.8 93.9 94.9 93.7  PLT 253 200 202 219 211 211   Basic Metabolic Panel: Recent Labs  Lab 03/21/20 0442 03/22/20 0441 03/23/20 0235 03/24/20 0251 03/25/20 0240 03/26/20 0807  NA 136 138 139 135 139 138  K 4.1 3.7 3.4* 4.0 3.8 3.8  CL 104 105 105 107 109 109  CO2 21* 21* 18*  GLUCOSE 125* 127* 119* 147* 131* 145*  BUN 25* 23 24* CREATININE 1.35* 1.20 1.38* 1.25* 1.13 1.03  CALCIUM 8.5* 8.6* 8.6* 7.1* 7.5* 7.7*  MG 2.0  --   --  1.8 2.3 2.1  PHOS  --   --   --  3.0 2.1* 3.3   GFR: Estimated Creatinine Clearance: 74 mL/min (by C-G formula based on SCr of 1.03 mg/dL). Liver Function Tests: Recent Labs  Lab 03/20/20 0014 03/24/20 0251 03/26/20 0807  AST 16 12* 15  ALT ALKPHOS 65 40 50  BILITOT 1.4* 0.7 0.7  PROT 7.6 4.9* 5.3*  ALBUMIN 4.3 2.4* 2.5*   Recent Labs  Lab 03/20/20 0014  LIPASE 37   No results for input(s): AMMONIA in the last 168 hours. Coagulation Profile: Recent Labs  Lab 03/20/20 1328  INR 1.2   Cardiac Enzymes: No results for input(s): CKTOTAL, CKMB, CKMBINDEX, TROPONINI in the last 168 hours. BNP (last 3 results) No results for input(s): PROBNP in the last 8760 hours. HbA1C: No results for input(s): HGBA1C in the last 72 hours. CBG: Recent Labs  Lab  03/25/20 0314 03/25/20 1237 03/25/20 1806 03/25/20 2316 03/26/20 0534  GLUCAP 139* 153* 126* 158* 134*   Lipid Profile: Recent Labs    03/24/20 0251 03/26/20 0807  TRIG 61 83   Thyroid Function Tests: No results for input(s): TSH, T4TOTAL, FREET4, T3FREE, THYROIDAB in the last 72 hours. Anemia Panel: No results for input(s): VITAMINB12, FOLATE, FERRITIN, TIBC, IRON, RETICCTPCT in the last 72 hours. Sepsis Labs: No results for input(s): PROCALCITON, LATICACIDVEN in the last 168 hours.  Recent Results (from the past 240 hour(s))  Resp Panel by RT-PCR (Flu A&B, Covid) Nasopharyngeal Swab     Status: None   Collection Time: 03/20/20  5:42 AM   Specimen: Nasopharyngeal Swab; Nasopharyngeal(NP) swabs in vial transport medium  Result Value Ref Range Status   SARS Coronavirus 2 by RT PCR NEGATIVE NEGATIVE Final    Comment: (NOTE) SARS-CoV-2 target nucleic acids are NOT DETECTED.  The SARS-CoV-2 RNA is generally detectable in upper respiratory specimens during the acute phase of infection. The lowest concentration of SARS-CoV-2 viral copies this assay can detect is 138 copies/mL. A negative result does not preclude SARS-Cov-2 infection and should not be used as the sole basis for treatment or other patient management decisions. A negative result may occur with  improper specimen collection/handling, submission of specimen other than nasopharyngeal swab, presence of viral mutation(s) within the  areas targeted by this assay, and inadequate number of viral copies(<138 copies/mL). A negative result must be combined with clinical observations, patient history, and epidemiological information. The expected result is Negative.  Fact Sheet for Patients:  BloggerCourse.com  Fact Sheet for Healthcare Providers:  SeriousBroker.it  This test is no t yet approved or cleared by the [place] FDA and  has been authorized for detection  and/or diagnosis of SARS-CoV-2 by FDA under an Emergency Use Authorization (EUA). This EUA will remain  in effect (meaning this test can be used) for the duration of the COVID-19 declaration under Section 564(b)(1) of the Act, 21 U.S.C.section 360bbb-3(b)(1), unless the authorization is terminated  or revoked sooner.       Influenza A by PCR NEGATIVE NEGATIVE Final   Influenza B by PCR NEGATIVE NEGATIVE Final    Comment: (NOTE) The Xpert Xpress SARS-CoV-2/FLU/RSV plus assay is intended as an aid in the diagnosis of influenza from Nasopharyngeal swab specimens and should not be used as a sole basis for treatment. Nasal washings and aspirates are unacceptable for Xpert Xpress SARS-CoV-2/FLU/RSV testing.  Fact Sheet for Patients: BloggerCourse.com  Fact Sheet for Healthcare Providers: SeriousBroker.it  This test is not yet approved or cleared by the [place] FDA and has been authorized for detection and/or diagnosis of SARS-CoV-2 by FDA under an Emergency Use Authorization (EUA). This EUA will remain in effect (meaning this test can be used) for the duration of the COVID-19 declaration under Section 564(b)(1) of the Act, 21 U.S.C. section 360bbb-3(b)(1), unless the authorization is terminated or revoked.  Performed at [facility], [address], [place], [place] [address]   MRSA PCR Screening     Status: None   Collection Time: 03/20/20  9:39 AM   Specimen: Nasal Mucosa; Nasopharyngeal  Result Value Ref Range Status   MRSA by PCR NEGATIVE NEGATIVE Final    Comment:        The GeneXpert MRSA Assay (FDA approved for NASAL specimens only), is one component of a comprehensive MRSA colonization surveillance program. It is not intended to diagnose MRSA infection nor to guide or monitor treatment for MRSA infections. Performed at [facility], [address] [place], [place]  [address]          Radiology Studies: VAS [place] UPPER EXTREMITY VENOUS DUPLEX  Result Date: 03/26/2020 UPPER VENOUS STUDY  Indications: Edema, and Erythema Limitations: Unable to position arm correctly as patient refused to move from recliner to bed. Comparison Study: No prior study Performing Technologist: Sherren Kerns RVS  Examination Guidelines: A complete evaluation includes B-mode imaging, spectral Doppler, color Doppler, and power Doppler as needed of all accessible portions of each vessel. Bilateral testing is considered an integral part of a complete examination. Limited examinations for reoccurring indications may be performed as noted.  Right Findings: +----------+------------+---------+-----------+----------+-------+ RIGHT     CompressiblePhasicitySpontaneousPropertiesSummary +----------+------------+---------+-----------+----------+-------+ Subclavian               Yes       Yes                      +----------+------------+---------+-----------+----------+-------+  Left Findings: +----------+------------+---------+-----------+----------+-------+ LEFT      CompressiblePhasicitySpontaneousPropertiesSummary +----------+------------+---------+-----------+----------+-------+ IJV           Full       Yes       Yes                      +----------+------------+---------+-----------+----------+-------+  Subclavian    Full       Yes       Yes                      +----------+------------+---------+-----------+----------+-------+ Axillary      Full       Yes       Yes                      +----------+------------+---------+-----------+----------+-------+ Brachial      Full       Yes       Yes                      +----------+------------+---------+-----------+----------+-------+ Radial        Full                                          +----------+------------+---------+-----------+----------+-------+ Ulnar         Full                                           +----------+------------+---------+-----------+----------+-------+ Cephalic      Full                                          +----------+------------+---------+-----------+----------+-------+ Basilic       None                                   Acute  +----------+------------+---------+-----------+----------+-------+  Summary:  Right: No evidence of thrombosis in the subclavian.  Left: No evidence of deep vein thrombosis in the upper extremity. Findings consistent with acute superficial vein thrombosis involving the left basilic vein. Basilic thrombus noted from proximal forearm to just above AC.  *See table(s) above for measurements and observations.    Preliminary         Scheduled Meds: . Chlorhexidine Gluconate Cloth  6 each Topical Once  . Chlorhexidine Gluconate Cloth  6 each Topical Daily  . feeding supplement  1 Container Oral BID BM  . hydrALAZINE  10 mg Intravenous Once  . insulin aspart  0-9 Units Subcutaneous Q6H  . mouth rinse  15 mL Mouth Rinse BID  . metoprolol tartrate  5 mg Intravenous Q8H  . sodium chloride flush  10-40 mL Intracatheter Q12H   Continuous Infusions: . sodium chloride 10 mL/hr at 03/26/20 1000  . amiodarone 30 mg/hr (03/26/20 1000)  . cefTRIAXone (ROCEPHIN)  IV Stopped (03/25/20 1552)  . heparin 1,600 Units/hr (03/26/20 1000)  . methocarbamol (ROBAXIN) IV Stopped (03/26/20 0721)  . metronidazole 500 mg (03/26/20 1030)  . TPN ADULT (ION) 60 mL/hr at 03/26/20 1000  . TPN ADULT (ION)       LOS: 6 days   Time spent= 35 mins   Shearon Balo, RN, NP Student   If 7PM-7AM, please contact night-coverage  03/26/2020, 10:50 AM

## 2020-03-26 NOTE — Consult Note (Signed)
WOC Nurse ostomy consult note Stoma type/location: LLQ colostomy, flush pink and moist Stomal assessment/size: 1 1/4" flush stoma Peristomal assessment: intact  Abdomen edematous Treatment options for stomal/peristomal skin: barrier ring and 2 piece 2 1/4" pouch. Output soft green stool Ostomy pouching: 2pc. 2 1/4" pouch  Education provided: Pouch change with wife at bedside.  Removed old pouch and cleansed skin.  Discussed twice weekly pouch changes.  Discussed flush stoma and that this may decrease wear time, and/or may improve as edema lessens to abdomen.  Cut barrier to fit.  Applied barrier ring.  Connected pouch and applied to patient.  demonstrated roll closure and emptying when 1/3 full.  Patient and wife feel more comfortable and will perform pouch change Wednesday at noon.  Enrolled patient in [employer] DC program: NO Will follow.   Maple Hudson MSN, RN, FNP-BC [facility]ON Wound, Ostomy, Continence Nurse Pager [phone]

## 2020-03-27 DIAGNOSIS — K668 Other specified disorders of peritoneum: Secondary | ICD-10-CM | POA: Diagnosis not present

## 2020-03-27 DIAGNOSIS — I160 Hypertensive urgency: Secondary | ICD-10-CM

## 2020-03-27 DIAGNOSIS — N182 Chronic kidney disease, stage 2 (mild): Secondary | ICD-10-CM | POA: Diagnosis not present

## 2020-03-27 DIAGNOSIS — K631 Perforation of intestine (nontraumatic): Secondary | ICD-10-CM | POA: Diagnosis not present

## 2020-03-27 DIAGNOSIS — I480 Paroxysmal atrial fibrillation: Secondary | ICD-10-CM | POA: Diagnosis not present

## 2020-03-27 LAB — GLUCOSE, CAPILLARY
Glucose-Capillary: 168 mg/dL — ABNORMAL HIGH (ref 70–99)
Glucose-Capillary: 150 mg/dL — ABNORMAL HIGH (ref 70–99)
Glucose-Capillary: 147 mg/dL — ABNORMAL HIGH (ref 70–99)
Glucose-Capillary: 157 mg/dL — ABNORMAL HIGH (ref 70–99)
Glucose-Capillary: 141 mg/dL — ABNORMAL HIGH (ref 70–99)

## 2020-03-27 LAB — MAGNESIUM: Magnesium: 2.1 mg/dL (ref 1.7–2.4)

## 2020-03-27 LAB — CBC
HCT: 40.5 % (ref 39.0–52.0)
Hemoglobin: 13.5 g/dL (ref 13.0–17.0)
MCH: 30.8 pg (ref 26.0–34.0)
MCHC: 33.3 g/dL (ref 30.0–36.0)
MCV: 92.5 fL (ref 80.0–100.0)
Platelets: 236 10*3/uL (ref 150–400)
RBC: 4.38 MIL/uL (ref 4.22–5.81)
RDW: 14.5 % (ref 11.5–15.5)
WBC: 11 10*3/uL — ABNORMAL HIGH (ref 4.0–10.5)
nRBC: 0 % (ref 0.0–0.2)

## 2020-03-27 LAB — PHOSPHORUS: Phosphorus: 3.8 mg/dL (ref 2.5–4.6)

## 2020-03-27 LAB — BASIC METABOLIC PANEL
Anion gap: 6 (ref 5–15)
BUN: 21 mg/dL (ref 8–23)
CO2: 23 mmol/L (ref 22–32)
Calcium: 7.6 mg/dL — ABNORMAL LOW (ref 8.9–10.3)
Chloride: 107 mmol/L (ref 98–111)
Creatinine, Ser: 1.12 mg/dL (ref 0.61–1.24)
GFR, Estimated: >60 mL/min (ref >60)
Glucose, Bld: 161 mg/dL — ABNORMAL HIGH (ref 70–99)
Potassium: 3.6 mmol/L (ref 3.5–5.1)
Sodium: 136 mmol/L (ref 135–145)

## 2020-03-27 LAB — HEPARIN LEVEL (UNFRACTIONATED): Heparin Unfractionated: 0.36 IU/mL (ref 0.30–0.70)

## 2020-03-27 LAB — POTASSIUM: Potassium: 4 mmol/L (ref 3.5–5.1)

## 2020-03-27 MED ORDER — HYDROXYZINE HCL 10 MG PO TABS: 10.0000 mg | ORAL_TABLET | Freq: Three times a day (TID) | ORAL | Status: DC | PRN

## 2020-03-27 MED ORDER — POTASSIUM CHLORIDE 10 MEQ/100ML IV SOLN: 10.0000 meq | INTRAVENOUS | Status: DC

## 2020-03-27 MED ORDER — FUROSEMIDE 10 MG/ML IJ SOLN
40.0000 mg | Freq: Two times a day (BID) | INTRAMUSCULAR | Status: DC
  Administered 2020-03-27: 40 mg via INTRAVENOUS
  Filled 2020-03-27: qty 4

## 2020-03-27 MED ORDER — TRAVASOL 10 % IV SOLN
INTRAVENOUS | Status: AC
  Filled 2020-03-27: qty 998.4

## 2020-03-27 MED ORDER — POTASSIUM CHLORIDE 10 MEQ/100ML IV SOLN
10.0000 meq | INTRAVENOUS | Status: AC
  Administered 2020-03-27 (×2): 10 meq via INTRAVENOUS
  Filled 2020-03-27 (×2): qty 100

## 2020-03-27 MED ORDER — ACETAMINOPHEN 10 MG/ML IV SOLN
1000.0000 mg | Freq: Four times a day (QID) | INTRAVENOUS | Status: AC
  Administered 2020-03-27 – 2020-03-28 (×4): 1000 mg via INTRAVENOUS
  Filled 2020-03-27 (×4): qty 100

## 2020-03-27 MED ORDER — FUROSEMIDE 10 MG/ML IJ SOLN
40.0000 mg | Freq: Two times a day (BID) | INTRAMUSCULAR | Status: AC
  Administered 2020-03-27 – 2020-03-28 (×3): 40 mg via INTRAVENOUS
  Filled 2020-03-27 (×3): qty 4

## 2020-03-27 NOTE — Progress Notes (Addendum)
[facility]  PROGRESS NOTE  Daniel Aguirre WUJ:[id number] DOB: 01/08/1951 DOA: 03/19/2020 PCP: Daisy Florooss, Charles Alan, MD   Brief HPI:   70 year old male with medical history of hypertension, mild renal insufficiency, atrial fibrillation on Eliquis and admission in early January for diverticulitis with abscess presented to ED with complaints of severe abdominal pain.  Patient was managed conservatively 2 months ago for diverticulitis with abscess that was not amenable to IR drainage.  At that time he was discharged on 4 weeks of outpatient antibiotics with Augmentin.  Patient said that he was doing fine and did developed abdominal pain a week ago, he started taking Augmentin which was left over from previous prescription.  Patient waterbrash on his chest abdominal.  He was seen by telehealth provider and was given prednisone 50 mg for 2 days.  After that patient developed left lower quadrant pain and came to ED for further evaluation. In the ED CT scan of the abdomen pelvis showed new pneumoperitoneum, this was due to previously demonstrated sigmoid diverticulitis.  General surgery was consulted.  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  He has been started on TPN per pharmacy.  General surgery is following.  Cardiology also was consulted for atrial fibrillation with RVR.  He was started on amiodarone IV, cardiology recommends to stop amiodarone once he takes p.o. medications.  He is also on anticoagulation with IV heparin.    Subjective   Patient seen and examined, feels better this morning.  Blood pressure was significantly elevated yesterday, not improved with Catapres patch, IV metoprolol or IV hydralazine.  He was started on nicardipine drip yesterday.  Currently blood pressure is well controlled.  He is +12L fluid overload.   Assessment/Plan:     1. Sigmoid diverticulitis with perforation/fistulous tract-s/p sigmoid colectomy, colostomy and  Hartman's pouch formation.  Patient was diagnosed with diverticulitis with abscess 2 months ago, at that time abscess was not amenable for drainage per IR.  So patient was treated with IV antibiotics and discharged on p.o. Augmentin.  Patient again developed abdominal pain, came to ED and CT abdomen/pelvis showed pneumoperitoneum.  General surgery was consulted, plan was to initially treat with IV antibiotics including ceftriaxone and Flagyl and observe.  However patient was not progressing well, continue to have abdominal pain and tenderness.  So plan was to take him for surgery.  Underwent surgery yesterday , morphine PCA pump was discontinued.  Patient started on TNA per pharmacy. 2. Hypertensive urgency-blood pressure was significant elevated yesterday with systolic BP over [id number], blood pressure was not controlled after he was put on Catapres patch, given IV hydralazine as needed and also scheduled metoprolol.  Started him on IV nicardipine drip, continue nicardipine drip for now. 3. Fluid overload-patient has significant swelling of upper extremities and lower extremities, he also has +2 L fluid overload.  We will start him on Lasix 40 mg IV every 12 hours for 3 doses.  Follow BMP in am. 4. Left upper extremity swelling-venous duplex of left upper extremity was obtained which only showed superficial thrombosis of basilic vein.  Does not show DVT. 5. Atrial fibrillation with RVR-patient has history of A. fib and is on Eliquis at home.  He went into A. fib with RVR, initially started on IV metoprolol however blood pressure dropped yesterday and heart rate not controlled.  Patient was switched to IV Cardizem gtt. however despite max dose of Cardizem patient continued to be in A. fib with RVR.  Cardiology was  consulted and patient started on IV amiodarone.  Currently he is back in normal sinus rhythm.   Heparin GTT was started however it was stopped after patient developed hematochezia.  Discussed with general  surgery, okay to restart heparin today.  Cardiology recommends to stop IV amiodarone once patient is taking p.o. and restart home medications including metoprolol and Cardizem. 6. Hematochezia-patient had small amount of bright red blood per rectum.  Heparin was started initially which was held.  Will restart Eliquis.  Surgery has recommended to restart anticoagulation.  Heparin drip has been restarted. 7. Hypertension-blood pressure is mildly elevated, will change metoprolol to 5 mg IV every 8 hours scheduled, continue as needed hydralazine 10 mg IV every 4 hours as needed for BP more than 160/100.   8. CKD stage II-creatinine is stable at 1.13     COVID-19 Labs  No results for input(s): DDIMER, FERRITIN, LDH, CRP in the last 72 hours.  Lab Results  Component Value Date   SARSCOV2NAA NEGATIVE 03/20/2020   SARSCOV2NAA NEGATIVE 01/23/2020     Scheduled medications:   . Chlorhexidine Gluconate Cloth  6 each Topical Once  . Chlorhexidine Gluconate Cloth  6 each Topical Daily  . cloNIDine  0.1 mg Transdermal Weekly  . feeding supplement  1 Container Oral BID BM  . furosemide  40 mg Intravenous BID  . hydrALAZINE  10 mg Intravenous Once  . insulin aspart  0-9 Units Subcutaneous Q6H  . mouth rinse  15 mL Mouth Rinse BID  . metoprolol tartrate  5 mg Intravenous Q8H  . sodium chloride flush  10-40 mL Intracatheter Q12H         CBG: Recent Labs  Lab 03/26/20 1732 03/26/20 2311 03/27/20 0605 03/27/20 0738 03/27/20 1119  GLUCAP 144* 141* 168* 150* 147*    SpO2: 93 % O2 Flow Rate (L/min): 2 L/min FiO2 (%): 100 %    CBC: Recent Labs  Lab 03/22/20 0441 03/23/20 0759 03/24/20 0251 03/26/20 0807 03/27/20 0229  WBC 12.3* 11.2* 11.0* 11.4* 11.0*  NEUTROABS  --  9.2* 9.8* 9.1*  --   HGB 14.7 14.2 13.2 12.6* 13.5  HCT 44.0 43.4 40.7 38.6* 40.5  MCV 92.8 93.9 94.9 93.7 92.5  PLT 202 219 211 211 236    Basic Metabolic Panel: Recent Labs  Lab 03/21/20 0442  03/22/20 0441 03/23/20 0235 03/24/20 0251 03/25/20 0240 03/26/20 0807 03/27/20 0229  NA 136   < > 139 135 139 138 136  K 4.1   < > 3.4* 4.0 3.8 3.8 3.6  CL 104   < > 105 107 109 109 107  CO2 21*   < > 25 23 24  18* 23  GLUCOSE 125*   < > 119* 147* 131* 145* 161*  BUN 25*   < > 24* 20 22 22 21   CREATININE 1.35*   < > 1.38* 1.25* 1.13 1.03 1.12  CALCIUM 8.5*   < > 8.6* 7.1* 7.5* 7.7* 7.6*  MG 2.0  --   --  1.8 2.3 2.1 2.1  PHOS  --   --   --  3.0 2.1* 3.3 3.8   < > = values in this interval not displayed.     Liver Function Tests: Recent Labs  Lab 03/24/20 0251 03/26/20 0807  AST 12* 15  ALT 12 15  ALKPHOS 40 50  BILITOT 0.7 0.7  PROT 4.9* 5.3*  ALBUMIN 2.4* 2.5*     Antibiotics: Anti-infectives (From admission, onward)   Start  Dose/Rate Route Frequency Ordered Stop   03/23/20 1000  clindamycin (CLEOCIN) 900 mg, gentamicin (GARAMYCIN) 240 mg in sodium chloride 0.9 % 1,000 mL for intraperitoneal lavage  Status:  Discontinued         Irrigation To Surgery 03/23/20 0724 03/23/20 1705   03/23/20 0815  cefoTEtan (CEFOTAN) 2 g in sodium chloride 0.9 % 100 mL IVPB        2 g 200 mL/hr over 30 Minutes Intravenous On call to O.R. 03/23/20 0724 03/23/20 1332   03/20/20 1400  piperacillin-tazobactam (ZOSYN) IVPB 3.375 g  Status:  Discontinued        3.375 g 12.5 mL/hr over 240 Minutes Intravenous Every 8 hours 03/20/20 0623 03/20/20 0636   03/20/20 1200  cefTRIAXone (ROCEPHIN) 2 g in sodium chloride 0.9 % 100 mL IVPB        2 g 200 mL/hr over 30 Minutes Intravenous Every 24 hours 03/20/20 0636     03/20/20 1200  metroNIDAZOLE (FLAGYL) IVPB 500 mg        500 mg 100 mL/hr over 60 Minutes Intravenous Every 8 hours 03/20/20 0636     03/20/20 0530  piperacillin-tazobactam (ZOSYN) IVPB 3.375 g        3.375 g 100 mL/hr over 30 Minutes Intravenous  Once 03/20/20 0516 03/20/20 0609       DVT prophylaxis: SCDs  Code Status: Full code  Family Communication: No family at  bedside   Consultants:  General surgery  Procedures:      Objective   Vitals:   03/27/20 1030 03/27/20 1100 03/27/20 1130 03/27/20 1200  BP: (!) 164/75 (!) 152/66 (!) 157/121 (!) 143/104  Pulse: 84 88 (!) 102   Resp: (!) 23 (!) 21 19   Temp:    98.4 F (36.9 C)  TempSrc:    Oral  SpO2: 93% 94% 93%   Weight:      Height:        Intake/Output Summary (Last 24 hours) at 03/27/2020 1227 Last data filed at 03/27/2020 1200 Gross per 24 hour  Intake 4502.45 ml  Output 2350 ml  Net 2152.45 ml    03/06 1901 - 03/08 0700 In: 5488 [I.V.:4744.1] Out: 3100 [Urine:2525; Drains:160]  Filed Weights   03/23/20 1221 03/25/20 0500 03/26/20 0500  Weight: 79.4 kg 85.4 kg 85.5 kg    Physical Examination:  General-appears in no acute distress Heart-S1-S2, regular, grade 3/6 diastolic murmur auscultated at aortic area Lungs-clear to auscultation bilaterally, no wheezing or crackles auscultated Abdomen-soft, nontender, no organomegaly Extremities-left upper extremity is edematous Neuro-alert, oriented x3, no focal deficit noted  Status is: Inpatient  Dispo: The patient is from: Home              Anticipated d/c is to: Home              Anticipated d/c date is: 03/29/2020              Patient currently not stable for discharge  Barrier to discharge-ongoing management for diverticulitis abscess with perforation.        Data Reviewed:   Recent Results (from the past 240 hour(s))  Resp Panel by RT-PCR (Flu A&B, Covid) Nasopharyngeal Swab     Status: None   Collection Time: 03/20/20  5:42 AM   Specimen: Nasopharyngeal Swab; Nasopharyngeal(NP) swabs in vial transport medium  Result Value Ref Range Status   SARS Coronavirus 2 by RT PCR NEGATIVE NEGATIVE Final    Comment: (NOTE) SARS-CoV-2 target nucleic acids  are NOT DETECTED.  The SARS-CoV-2 RNA is generally detectable in upper respiratory specimens during the acute phase of infection. The lowest concentration of  SARS-CoV-2 viral copies this assay can detect is 138 copies/mL. A negative result does not preclude SARS-Cov-2 infection and should not be used as the sole basis for treatment or other patient management decisions. A negative result may occur with  improper specimen collection/handling, submission of specimen other than nasopharyngeal swab, presence of viral mutation(s) within the areas targeted by this assay, and inadequate number of viral copies(<138 copies/mL). A negative result must be combined with clinical observations, patient history, and epidemiological information. The expected result is Negative.  Fact Sheet for Patients:  BloggerCourse.com  Fact Sheet for Healthcare Providers:  SeriousBroker.it  This test is no t yet approved or cleared by the [place] FDA and  has been authorized for detection and/or diagnosis of SARS-CoV-2 by FDA under an Emergency Use Authorization (EUA). This EUA will remain  in effect (meaning this test can be used) for the duration of the COVID-19 declaration under Section 564(b)(1) of the Act, 21 U.S.C.section 360bbb-3(b)(1), unless the authorization is terminated  or revoked sooner.       Influenza A by PCR NEGATIVE NEGATIVE Final   Influenza B by PCR NEGATIVE NEGATIVE Final    Comment: (NOTE) The Xpert Xpress SARS-CoV-2/FLU/RSV plus assay is intended as an aid in the diagnosis of influenza from Nasopharyngeal swab specimens and should not be used as a sole basis for treatment. Nasal washings and aspirates are unacceptable for Xpert Xpress SARS-CoV-2/FLU/RSV testing.  Fact Sheet for Patients: BloggerCourse.com  Fact Sheet for Healthcare Providers: SeriousBroker.it  This test is not yet approved or cleared by the [place] FDA and has been authorized for detection and/or diagnosis of SARS-CoV-2 by FDA under an Emergency Use  Authorization (EUA). This EUA will remain in effect (meaning this test can be used) for the duration of the COVID-19 declaration under Section 564(b)(1) of the Act, 21 U.S.C. section 360bbb-3(b)(1), unless the authorization is terminated or revoked.  Performed at [facility], [address] [place], [place] [address]   MRSA PCR Screening     Status: None   Collection Time: 03/20/20  9:39 AM   Specimen: Nasal Mucosa; Nasopharyngeal  Result Value Ref Range Status   MRSA by PCR NEGATIVE NEGATIVE Final    Comment:        The GeneXpert MRSA Assay (FDA approved for NASAL specimens only), is one component of a comprehensive MRSA colonization surveillance program. It is not intended to diagnose MRSA infection nor to guide or monitor treatment for MRSA infections. Performed at [facility], [address], [place], [place] [address]     No results for input(s): LIPASE, AMYLASE in the last 168 hours.      Meredeth Ide   [facility] If 7PM-7AM, please contact night-coverage at [email or web address], Office  [phone]   03/27/2020, 12:27 PM  LOS: 7 days

## 2020-03-27 NOTE — Progress Notes (Signed)
PHARMACY - TOTAL PARENTERAL NUTRITION CONSULT NOTE   Indication: Prolonged ileus  Patient Measurements: Height: 5\' 8"  (172.7 cm) Weight: 85.5 kg (188 lb 7.9 oz) IBW/kg (Calculated) : 68.4 TPN AdjBW (KG): 79.4 Body mass index is 28.66 kg/m. 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 Patient was initially treated medically but decision was made for surgical intervention. Patient is now s/p sigmoid colectomy and colostomy with Hartman's pouch on 3/4.  He is now over 5 days with no PO intake for surgery on 3/4, and anticipate prolonged ileus.  He is high risk for refeeding syndrome.    Glucose / Insulin: CBGs 130-168 after starting TPN. 5 units SSI given / 24 hrs. No hx of DM noted. Electrolytes: goal K > 4 and Mg > 2 on amio; CorrCa 8.8.  Bicarb now wnl Renal: SCr 1.12 slight increase, BUN 21 Hepatic: AST/ALT, Tbili WNL, TG wnl Intake / Output; MIVF: I/O +2L yesterday, UOP 1.175L, net + 11 L since 3/2 - NG tube placed 3/4 to low intermittent suction w/ Bile/Brown drainage.225 ml output from ostomy - no mIVF Prealbumin:  12 (3/4) > 8.4 (3/5) > 13.4 (3/7) GI Surgeries / Procedures:  3/4 Sigmoid colectomy and colostomy and Hartman's pouch formation  Central access: Triple lumen PICC placed 3/5 TPN start date: 3/5  Nutritional Goals (per RD recommendation on 3/5): Kcal:  2100-2300, Protein:  120-135 gm, Fluid:  2.1-2.3 L  Goal TPN rate is 100 mL/hr (provides 125g of protein and 7 day average of 2194 kcals per day)  With Lipids (SMOF 22 g/L = 53g) on MWF only d/t national shortage: 2496 kcal/day  Without lipids on TTSS: 1968 kcal/day  Current Nutrition:  NPO and TPN  Plan:  Now: KCl 10 meq iv x 2 runs (4 ordered initially but MD prefers 2 and recheck; note Lasix ordered, net + 11 L this admission)  At 18:00: Due to volume status and hypertension, will not advance TPN to  goal today Contine TPN 80 mL/hr at 1800 Electrolytes in TPN: Na 71mEq/L, K 75mEq/L, Ca 32mEq/L, Mg 48mEq/L, and Phos  20 mmol/L. Cl:Ac 1:2 Standard MVI and trace elements to TPN CBGs and sensitive SSI q6h Consider decreasing checks once stable MIVF d/c Monitor TPN labs on Mon/Thurs. Recheck K this PM and replace if needed (lasix ordered bid) BMET, mg, and phos in AM  4m D  03/27/2020 7:07 AM

## 2020-03-27 NOTE — Progress Notes (Addendum)
ANTICOAGULATION CONSULT NOTE - Follow Up Consult  Pharmacy Consult for Heparin Indication: atrial fibrillation  Allergies  Allergen Reactions  . Amlodipine Palpitations  . Augmentin [Amoxicillin-Pot Clavulanate] Other (See Comments)    Rash developed in 2022- Patient initially tolerated ~ 4 weeks of therapy. When he re-started augmentin at a later date, he developed a rash about 1 week into therapy. No shortness of breath or swelling.  . Lisinopril Other (See Comments)    Elevated  creatinine     Patient Measurements: Height: 5\' 8"  (172.7 cm) Weight: 85.5 kg (188 lb 7.9 oz) IBW/kg (Calculated) : 68.4 Heparin Dosing Weight: TBW  Vital Signs: Temp: 98.2 F (36.8 C) (03/08 0400) Temp Source: Oral (03/08 0400) BP: 129/63 (03/08 0630) Pulse Rate: 72 (03/08 0630)  Labs: Recent Labs    03/25/20 0240 03/25/20 1559 03/26/20 0807 03/27/20 0229  HGB  --   --  12.6* 13.5  HCT  --   --  38.6* 40.5  PLT  --   --  211 236  HEPARINUNFRC  --  0.36 0.44 0.36  CREATININE 1.13  --  1.03 1.12    Estimated Creatinine Clearance: 68.1 mL/min (by C-G formula based on SCr of 1.12 mg/dL).   Assessment: 63 yoM admitted with diverticulitis, microperforation.  PMH includes Afib on chronic apixaban anticoagulation which was held on admission and transitioned to Heparin IV pending surgery.  Heparin was held on 3/3 for hematochezia, and he is now s/p ex lap, partial colectomy, colostomy on 3/4.  Prophylactic Lovenox dosing was started on POD1, 3/5.  Pharmacy is now consulted to resume dosing Heparin - no bolus per Surgery.    03/27/20 7:23 AM   HL 0.36 at goal  CBC stable  Of note, per doppler acute superficial vein thrombosis involving the left basilic vein  No bleeding reported  Goal of Therapy:  Heparin level 0.3-0.7 units/ml Monitor platelets by anticoagulation protocol: Yes   Plan:  Continue heparin infusion at 1600 units/hr Daily HL/CBC  Thank you for allowing pharmacy to  participate in this patient's care.    05/27/20 D 03/27/2020,7:23 AM

## 2020-03-27 NOTE — Progress Notes (Signed)
4 Days Post-Op    CC: Abdominal pain  Subjective: Patient sitting up in bed looks fairly comfortable.  He is in sinus rhythm this morning.  Midline incision looks good.  Dressing was changed.  Ostomy is putting out some green-colored stool and gas.  The ostomy is a bit swollen but looks pink and healthy.  He still somewhat distended.  Objective: Vital signs in last 24 hours: Temp:  [98.1 F (36.7 C)-98.8 F (37.1 C)] 98.2 F (36.8 C) (03/08 0400) Pulse Rate:  [64-104] 72 (03/08 0630) Resp:  [12-25] 15 (03/08 0630) BP: (111-237)/(59-133) 129/63 (03/08 0630) SpO2:  [89 %-98 %] 94 % (03/08 0630) Last BM Date: 03/26/20 Nothing p.o. recorded 3521 IV 1175 urine Drain 110 Stool 225 Afebrile, blood pressures improving heart rate in the 70-80's; sats good on room air Wt 79>> 85 Kg Potassium 3.6, glucose 161, magnesium 2.1, WBC 11.0 H/H 13.5/40.5 Platelets 236,000  Intake/Output from previous day: 03/07 0701 - 03/08 0700 In: 3521.1 [I.V.:2977; IV Piggyback:544.1] Out: 1550 [Urine:1175; Drains:110; Stool:225] Intake/Output this shift: Total I/O In: 1658.6 [I.V.:1512; IV Piggyback:146.6] Out: 750 [Urine:650; Drains:100]  General appearance: alert, cooperative and no distress Resp: clear to auscultation bilaterally and Reports moving 1250 on incentive spirometry GI: Still distended, midline incision looks good, JP drain is serosanguineous fluid that is clear.  Ostomy is pink but still somewhat edematous.  There is gas and green liquid stool coming from the ostomy.  Lab Results:  Recent Labs    03/26/20 0807 03/27/20 0229  WBC 11.4* 11.0*  HGB 12.6* 13.5  HCT 38.6* 40.5  PLT 211 236    BMET Recent Labs    03/26/20 0807 03/27/20 0229  NA 138 136  K 3.8 3.6  CL 109 107  CO2 18* 23  GLUCOSE 145* 161*  BUN 22 21  CREATININE 1.03 1.12  CALCIUM 7.7* 7.6*   PT/INR No results for input(s): LABPROT, INR in the last 72 hours.  Recent Labs  Lab 03/24/20 0251  03/26/20 0807  AST 12* 15  ALT 12 15  ALKPHOS 40 50  BILITOT 0.7 0.7  PROT 4.9* 5.3*  ALBUMIN 2.4* 2.5*     Lipase     Component Value Date/Time   LIPASE 37 03/20/2020 0014     Medications: . Chlorhexidine Gluconate Cloth  6 each Topical Once  . Chlorhexidine Gluconate Cloth  6 each Topical Daily  . cloNIDine  0.1 mg Transdermal Weekly  . feeding supplement  1 Container Oral BID BM  . hydrALAZINE  10 mg Intravenous Once  . insulin aspart  0-9 Units Subcutaneous Q6H  . mouth rinse  15 mL Mouth Rinse BID  . metoprolol tartrate  5 mg Intravenous Q8H  . sodium chloride flush  10-40 mL Intracatheter Q12H   . sodium chloride Stopped (03/27/20 0201)  . amiodarone 30 mg/hr (03/27/20 0432)  . cefTRIAXone (ROCEPHIN)  IV Stopped (03/26/20 1317)  . heparin 1,600 Units/hr (03/27/20 0431)  . methocarbamol (ROBAXIN) IV Stopped (03/27/20 0626)  . metronidazole Stopped (03/27/20 0301)  . niCARDipine 5 mg/hr (03/27/20 0335)  . potassium chloride    . 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 Luisa Hart on 03/23/20 POD #4 -starting to have colostomy function, but still seems bloated.  Continue sips of clears from the floor today -continue IS and pulm toilet -mobilize -cont abx therapy -BID dressing changes to midline wound -cont JP drain -WOC consult for new colostomy teaching  WNI:OEVO of clears/NPO/TNA ID: Rocephin/Flagyl DVT: SCDs;heparin gtt        LOS: 7 days    JENNINGS,WILLARD 03/27/2020 Please see Amion

## 2020-03-27 NOTE — Consult Note (Addendum)
WOC Nurse ostomy consult note Surgical team noted that os was pointing downward during their vist this morning and requested a pouch change.  Stoma type/location: LLQ colostomy Stomal assessment/size: Red and viable, flush with skin level, 1 1/2 oval stoma with os pointing downward as noted above  Peristomal assessment: intact  skin surrounding Output small amt soft green stool Ostomy pouching: Applied barrier ring Hart [place] # [phone]) and convex pouch Hart [place] # [phone]).  2 extra sets of barrier rings and pouches left at the bedside. Education provided: Reviewed pouching steps with patient.  He watched the procedure and asked appropriate questions.  WOC team member plans to perform another pouch change and teaching session Wednesday at noon with pt and wife.  Enrolled patient in [place] Secure Start DC program: NOT YET Cammie Mcgee MSN, RN, [place], [place], [place] [phone]

## 2020-03-28 DIAGNOSIS — I480 Paroxysmal atrial fibrillation: Secondary | ICD-10-CM

## 2020-03-28 DIAGNOSIS — N182 Chronic kidney disease, stage 2 (mild): Secondary | ICD-10-CM

## 2020-03-28 LAB — BASIC METABOLIC PANEL
Anion gap: 8 (ref 5–15)
BUN: 23 mg/dL (ref 8–23)
CO2: 24 mmol/L (ref 22–32)
Calcium: 7.7 mg/dL — ABNORMAL LOW (ref 8.9–10.3)
Chloride: 105 mmol/L (ref 98–111)
Creatinine, Ser: 1.21 mg/dL (ref 0.61–1.24)
GFR, Estimated: >60 mL/min (ref >60)
Glucose, Bld: 136 mg/dL — ABNORMAL HIGH (ref 70–99)
Potassium: 3.5 mmol/L (ref 3.5–5.1)
Sodium: 137 mmol/L (ref 135–145)

## 2020-03-28 LAB — PHOSPHORUS: Phosphorus: 4.2 mg/dL (ref 2.5–4.6)

## 2020-03-28 LAB — MAGNESIUM: Magnesium: 2 mg/dL (ref 1.7–2.4)

## 2020-03-28 LAB — CBC
HCT: 37.3 % — ABNORMAL LOW (ref 39.0–52.0)
Hemoglobin: 12.4 g/dL — ABNORMAL LOW (ref 13.0–17.0)
MCH: 30.5 pg (ref 26.0–34.0)
MCHC: 33.2 g/dL (ref 30.0–36.0)
MCV: 91.9 fL (ref 80.0–100.0)
Platelets: 241 10*3/uL (ref 150–400)
RBC: 4.06 MIL/uL — ABNORMAL LOW (ref 4.22–5.81)
RDW: 14.5 % (ref 11.5–15.5)
WBC: 11 10*3/uL — ABNORMAL HIGH (ref 4.0–10.5)
nRBC: 0 % (ref 0.0–0.2)

## 2020-03-28 LAB — GLUCOSE, CAPILLARY
Glucose-Capillary: 129 mg/dL — ABNORMAL HIGH (ref 70–99)
Glucose-Capillary: 136 mg/dL — ABNORMAL HIGH (ref 70–99)
Glucose-Capillary: 138 mg/dL — ABNORMAL HIGH (ref 70–99)
Glucose-Capillary: 140 mg/dL — ABNORMAL HIGH (ref 70–99)
Glucose-Capillary: 150 mg/dL — ABNORMAL HIGH (ref 70–99)
Glucose-Capillary: 162 mg/dL — ABNORMAL HIGH (ref 70–99)

## 2020-03-28 LAB — HEPARIN LEVEL (UNFRACTIONATED): Heparin Unfractionated: 0.43 IU/mL (ref 0.30–0.70)

## 2020-03-28 MED ORDER — EPINEPHRINE 0.3 MG/0.3ML IJ SOAJ
0.3000 mg | Freq: Once | INTRAMUSCULAR | Status: DC | PRN
  Filled 2020-03-28: qty 0.6

## 2020-03-28 MED ORDER — VITAMIN D 25 MCG (1000 UNIT) PO TABS
5000.0000 [IU] | ORAL_TABLET | Freq: Every day | ORAL | Status: DC
  Administered 2020-03-28: 5000 [IU] via ORAL
  Filled 2020-03-28: qty 5

## 2020-03-28 MED ORDER — POTASSIUM CHLORIDE 10 MEQ/50ML IV SOLN
10.0000 meq | INTRAVENOUS | Status: AC
  Administered 2020-03-28 (×4): 10 meq via INTRAVENOUS
  Filled 2020-03-28 (×4): qty 50

## 2020-03-28 MED ORDER — METOPROLOL TARTRATE 25 MG PO TABS
25.0000 mg | ORAL_TABLET | Freq: Two times a day (BID) | ORAL | Status: DC
  Administered 2020-03-28 – 2020-04-02 (×11): 25 mg via ORAL
  Filled 2020-03-28 (×11): qty 1

## 2020-03-28 MED ORDER — VITAMIN D 25 MCG (1000 UNIT) PO TABS
1000.0000 [IU] | ORAL_TABLET | Freq: Every day | ORAL | Status: DC
  Administered 2020-03-29 – 2020-04-02 (×5): 1000 [IU] via ORAL
  Filled 2020-03-28 (×5): qty 1

## 2020-03-28 MED ORDER — TRACE MINERALS CU-MN-SE-ZN 300-55-60-3000 MCG/ML IV SOLN
INTRAVENOUS | Status: AC
  Filled 2020-03-28: qty 832

## 2020-03-28 MED ORDER — TAMSULOSIN HCL 0.4 MG PO CAPS
0.4000 mg | ORAL_CAPSULE | Freq: Every day | ORAL | Status: DC
  Administered 2020-03-28 – 2020-04-02 (×6): 0.4 mg via ORAL
  Filled 2020-03-28 (×6): qty 1

## 2020-03-28 MED ORDER — ACETAMINOPHEN 325 MG PO TABS: 650.0000 mg | ORAL_TABLET | Freq: Four times a day (QID) | ORAL | Status: DC | PRN

## 2020-03-28 MED ORDER — DILTIAZEM HCL ER COATED BEADS 240 MG PO CP24
240.0000 mg | ORAL_CAPSULE | Freq: Every day | ORAL | Status: DC
  Administered 2020-03-28 – 2020-04-02 (×6): 240 mg via ORAL
  Filled 2020-03-28: qty 1
  Filled 2020-03-28: qty 2
  Filled 2020-03-28 (×4): qty 1

## 2020-03-28 NOTE — Progress Notes (Addendum)
5 Days Post-Op    CC: Abdominal pain  Subjective: He had some bleeding from his drain site last evening.  I took the dressing down stitches still in place drain continues to have serosanguineous fluid.  Midline incision looks okay, he has some liquid stool and gas in the bag this morning.  He feels a little less distended.  Objective: Vital signs in last 24 hours: Temp:  [97.7 F (36.5 C)-98.4 F (36.9 C)] 97.7 F (36.5 C) (03/09 0000) Pulse Rate:  [67-102] 70 (03/09 0300) Resp:  [14-24] 15 (03/09 0300) BP: (118-179)/(58-121) 118/59 (03/09 0300) SpO2:  [90 %-98 %] 97 % (03/09 0300) Weight:  [85.5 kg] 85.5 kg (03/09 0500) Last BM Date: 03/27/20 5137 IV 3515 urine 390 drain 325 ostomy Afebrile, blood pressure is stable but still on the nicardipine drip. Labs are stable. WT 81.6>>79.4>.85.5Kg Intake/Output from previous day: 03/08 0701 - 03/09 0700 In: 5137.2 [I.V.:4088.6; IV Piggyback:1048.5] Out: 4230 [Urine:3515; Drains:390; Stool:325] Intake/Output this shift: Total I/O In: 1814.1 [I.V.:1550.3; IV Piggyback:263.8] Out: 1075 [Urine:1050; Drains:25]  General appearance: alert, cooperative and no distress Resp: clear to auscultation bilaterally GI: Still mildly distended, he is up in the chair.  JP drain serosanguineous fluid.  Still in place.  Ostomy bag has liquid stool and gas in it.  Lab Results:  Recent Labs    03/27/20 0229 03/28/20 0550  WBC 11.0* 11.0*  HGB 13.5 12.4*  HCT 40.5 37.3*  PLT 236 241    BMET Recent Labs    03/27/20 0229 03/27/20 1245 03/28/20 0550  NA 136  --  137  K 3.6 4.0 3.5  CL 107  --  105  CO2 23  --  24  GLUCOSE 161*  --  136*  BUN 21  --  23  CREATININE 1.12  --  1.21  CALCIUM 7.6*  --  7.7*   PT/INR No results for input(s): LABPROT, INR in the last 72 hours.  Recent Labs  Lab 03/24/20 0251 03/26/20 0807  AST 12* 15  ALT 12 15  ALKPHOS 40 50  BILITOT 0.7 0.7  PROT 4.9* 5.3*  ALBUMIN 2.4* 2.5*     Lipase      Component Value Date/Time   LIPASE 37 03/20/2020 0014     Medications: . Chlorhexidine Gluconate Cloth  6 each Topical Once  . Chlorhexidine Gluconate Cloth  6 each Topical Daily  . cloNIDine  0.1 mg Transdermal Weekly  . feeding supplement  1 Container Oral BID BM  . furosemide  40 mg Intravenous BID  . hydrALAZINE  10 mg Intravenous Once  . insulin aspart  0-9 Units Subcutaneous Q6H  . mouth rinse  15 mL Mouth Rinse BID  . metoprolol tartrate  5 mg Intravenous Q8H  . sodium chloride flush  10-40 mL Intracatheter Q12H   . sodium chloride 10 mL/hr at 03/27/20 1600  . acetaminophen 1,000 mg (03/28/20 3818)  . amiodarone 30 mg/hr (03/28/20 0339)  . cefTRIAXone (ROCEPHIN)  IV Stopped (03/27/20 1237)  . heparin 1,600 Units/hr (03/28/20 0339)  . methocarbamol (ROBAXIN) IV 500 mg (03/28/20 2993)  . metronidazole Stopped (03/28/20 0310)  . niCARDipine 5 mg/hr (03/28/20 0644)  . 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 Luisa Hart on 03/23/20 POD #5 -starting to have colostomy function, but still seems bloated. Continue sips of clears from the floor today -continue IS and pulm toilet -mobilize -cont abx therapy -BID dressing changes to midline wound -cont JP drain -WOC consult for new colostomy teaching  TDD:UKGU of clears/NPO/TNA ID: cefotetan x 1 3/4 preop; Rocephin/Flagyl 3/1 >> day 9 DVT: SCDs;heparin gtt  Plan: Advance to clear liquids/nutrition consult to help with calorie and protein intake.  Continue antibiotics today we will check on discontinuing them tomorrow.  We can begin  weaning TPN tomorrow if he tolerates liquids well.   LOS: 8 days    JENNINGS,WILLARD 03/28/2020 Please see Amion

## 2020-03-28 NOTE — Plan of Care (Signed)

## 2020-03-28 NOTE — Progress Notes (Signed)
ANTICOAGULATION CONSULT NOTE - Follow Up Consult  Pharmacy Consult for Heparin Indication: atrial fibrillation  Allergies  Allergen Reactions  . Amlodipine Palpitations  . Augmentin [Amoxicillin-Pot Clavulanate] Other (See Comments)    Rash developed in 2022- Patient initially tolerated ~ 4 weeks of therapy. When he re-started augmentin at a later date, he developed a rash about 1 week into therapy. No shortness of breath or swelling.  . Lisinopril Other (See Comments)    Elevated  creatinine     Patient Measurements: Height: 5\' 8"  (172.7 cm) Weight: 85.5 kg (188 lb 7.9 oz) IBW/kg (Calculated) : 68.4 Heparin Dosing Weight: TBW  Vital Signs: Temp: 97.7 F (36.5 C) (03/09 0000) Temp Source: Oral (03/09 0000) BP: 146/61 (03/09 0700) Pulse Rate: 63 (03/09 0700)  Labs: Recent Labs    03/26/20 0807 03/27/20 0229 03/28/20 0550  HGB 12.6* 13.5 12.4*  HCT 38.6* 40.5 37.3*  PLT 211 236 241  HEPARINUNFRC 0.44 0.36 0.43  CREATININE 1.03 1.12 1.21    Estimated Creatinine Clearance: 63 mL/min (by C-G formula based on SCr of 1.21 mg/dL).   Assessment: 33 yoM admitted with diverticulitis, microperforation.  PMH includes Afib on chronic apixaban anticoagulation which was held on admission and transitioned to Heparin IV pending surgery.  Heparin was held on 3/3 for hematochezia, and he is now s/p ex lap, partial colectomy, colostomy on 3/4.  Prophylactic Lovenox dosing was started on POD1, 3/5.  Pharmacy is now consulted to resume dosing Heparin - no bolus per Surgery.    03/28/20 7:29 AM   HL 0.43 at goal  CBC stable  Of note, per doppler acute superficial vein thrombosis involving the left basilic vein  No bleeding reported  Goal of Therapy:  Heparin level 0.3-0.7 units/ml Monitor platelets by anticoagulation protocol: Yes   Plan:  Continue heparin infusion at 1600 units/hr Daily HL/CBC  Thank you for allowing pharmacy to participate in this patient's care.     05/28/20 D 03/28/2020,7:29 AM

## 2020-03-28 NOTE — Progress Notes (Signed)
PHARMACY - TOTAL PARENTERAL NUTRITION CONSULT NOTE   Indication: Prolonged ileus  Patient Measurements: Height: 5\' 8"  (172.7 cm) Weight: 85.5 kg (188 lb 7.9 oz) IBW/kg (Calculated) : 68.4 TPN AdjBW (KG): 79.4 Body mass index is 28.66 kg/m. 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 Patient was initially treated medically but decision was made for surgical intervention. Patient is now s/p sigmoid colectomy and colostomy with Hartman's pouch on 3/4.  He is now over 5 days with no PO intake for surgery on 3/4, and anticipate prolonged ileus.  He is high risk for refeeding syndrome.    Glucose / Insulin: CBGs 136-157 after starting TPN. 5 units SSI given / 24 hrs. No hx of DM noted. Electrolytes: goal K > 4 and Mg > 2 on amio; CorrCa 8.9.  Bicarb now wnl Renal: SCr 1.21 slight increase, BUN 23 Hepatic: AST/ALT, Tbili WNL, TG wnl Intake / Output; MIVF: I/O +1.5L yesterday, UOP 3.515L, net + 12 L since 3/2 325 ml output from ostomy - no mIVF Prealbumin:  12 (3/4) > 8.4 (3/5) > 13.4 (3/7) GI Surgeries / Procedures:  3/4 Sigmoid colectomy and colostomy and Hartman's pouch formation  Central access: Triple lumen PICC placed 3/5 TPN start date: 3/5  Nutritional Goals (per RD recommendation on 3/5): Kcal:  2100-2300, Protein:  120-135 gm, Fluid:  2.1-2.3 L  Goal TPN rate is 100 mL/hr (provides 125g of protein and 7 day average of 2194 kcals per day)  With Lipids (SMOF 22 g/L = 53g) on MWF only d/t national shortage: 2496 kcal/day  Without lipids on TTSS: 1968 kcal/day  Current Nutrition:  NPO and TPN  Plan:  Now: KCl 10 meq x 4 runs  At 18:00: Due to volume status and hypertension, will advance TPN to goal calories while maintaining same rate using concentrated amino acids  Lipids today (MWF) ~ 50 g Contine TPN 80 mL/hr at 1800 with Clinisol and a total of 125 g protein,  55 g lipids, and 2483 kcal Continue current electrolytes but consider increasing potassium, not increasing currently as expect decrease related to Lasix Electrolytes in TPN: Na 75mEq/L, K 79mEq/L, Ca 16mEq/L, Mg 26mEq/L, and Phos  20 mmol/L. Cl:Ac 1:2 Standard MVI and trace elements to TPN CBGs and sensitive SSI q6h Consider decreasing checks once if CBGs remain at goal tomorrow now that TPN is at goal  MIVF d/c Monitor TPN labs on Mon/Thurs.  4m D  03/28/2020 7:29 AM

## 2020-03-28 NOTE — TOC Progression Note (Signed)
Transition of Care [facility]) - Progression Note    Patient Details  Name: Daniel Aguirre MRN: [id number] Date of Birth: 10-29-1950  [facility]) CM/SW Contact  Golda Acre, RN Phone Number: 03/28/2020, 7:52 AM  Clinical Narrative:    5 Days Post-Op    CC: Abdominal pain  Subjective: Patient sitting up in bed looks fairly comfortable.  He is in sinus rhythm this morning.  Midline incision looks good.  Dressing was changed.  Ostomy is putting out some green-colored stool and gas.  The ostomy is a bit swollen but looks pink and healthy.  He still somewhat distended. PLAN_ to return to home following for toc and hhc needs with patient and new colostomy.  Expected Discharge Plan: Home/Self Care Barriers to Discharge: Continued Medical Work up  Expected Discharge Plan and Services Expected Discharge Plan: Home/Self Care   Discharge Planning Services: CM Consult   Living arrangements for the past 2 months: Single Family Home                                       Social Determinants of Health (SDOH) Interventions    Readmission Risk Interventions No flowsheet data found.

## 2020-03-28 NOTE — Consult Note (Signed)
WOC Nurse ostomy follow up Pouch applied yesterday remains intact.  Patient observes pouch emptying and steps involved.  Due to pouch change last 2 days, I will change Thursday to avoid medical adhesive related skin injury and stripping due to frequent pouch removal.  Patient is in agreement and his wife will be present for more teaching tomorrow.  Will follow.  Maple Hudson MSN, RN, FNP-BC CWON Wound, Ostomy, Continence Nurse Pager [phone]

## 2020-03-28 NOTE — Progress Notes (Signed)
PROGRESS NOTE   Daniel Aguirre  RJJ:[id number]    DOB: January 23, 1950    DOA: 03/19/2020  PCP: Daisy Floro, MD   I have briefly reviewed patients previous medical records in [facility].  Chief Complaint  Patient presents with  . Abdominal Pain    Brief Narrative:  70 year old male with medical history not limited to hypertension, CKD stage II, atrial fibrillation on Eliquis anticoagulation, recent hospitalization 01/22/2020-01/29/2020 for diverticulitis with perforation and abscess, not amenable to IR drainage secondary to location, general surgery and ID consulted, managed conservatively and discharged home on 4 weeks of oral Augmentin, had improved but then developed progressive left lower quadrant abdominal pain that started a week prior to admission.  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  Hospital course complicated by A. fib with RVR and uncontrolled hypertension, improved.   Assessment & Plan:  Principal Problem:   Bowel perforation (HCC) Active Problems:   A-fib (HCC)   Chronic kidney disease (CKD), stage II (mild)   Sigmoid diverticulitis with perforation/fistulous tract:  History as noted above with recent episode of diverticulitis with perforation and abscess in early January that was not amenable to IR drainage and was treated with IV followed by oral antibiotics.  CT abdomen and pelvis on admission showed pneumoperitoneum.  General surgery was consulted.  Plan was to initially treat with IV antibiotics and monitor.  Patient however did not progress well and continued to have abdominal pain and tenderness.  Now s/p sigmoid colectomy, colostomy and Hartman's pouch on 03/23/2020.  Now off morphine PCA.  Remains on TNA.  General surgery has advanced to clear liquid diet.  General surgery recommends continuing IV  antibiotics today and possible DC 3/10 and also possibly DC TPN on 3/10.  Continue to mobilize.  Hypertensive urgency/essential hypertension  SBP's on 3/7 were in the 200s, not controlled on clonidine patch, as needed IV hydralazine and scheduled IV metoprolol.  Thereby he was started on IV nicardipine drip.  Also received IV Lasix 40 mg every 12 hours x3 doses for fluid overload.  Blood pressures improved.  Now that oral intake is permitted by general surgery, resumed prior home dose of metoprolol 25 mg twice daily and diltiazem CD 240 mg daily.  Discontinued clonidine patch, discontinued Cardene drip an hour after oral antihypertensives resumed.  Improved.  Monitor closely.  Anasarca  Multifactorial due to volume resuscitation during acute illness earlier on, hypoalbuminemia and poor nutritional status.  +13.4 L since admission.  Treated with IV Lasix 40 mg every 12 hourly.  Put out 3.5 L urine yesterday.  Volume status is significantly improved.  Hold off on further Lasix.  Left upper extremity swelling  Likely related to anasarca and basilic vein SVT noted on venous Doppler.  Improved after IV Lasix.  Elevate upper extremity and monitor.  A. fib with RVR  Developed A. fib with RVR earlier in hospitalization.  This was not adequately controlled on IV metoprolol or switching to IV Cardizem drip.  Cardiology was consulted and patient was placed on IV amiodarone drip.  Patient reverted to normal sinus rhythm.  After discussing with general surgery, heparin anticoagulation was started.  Developed transient hematochezia which resolved.  Heparin IV was continued.  As per cardiology recommendation, IV amiodarone discontinued when oral meds started as noted above.  Since patient had some bleeding overnight at JP drain site, will continue IV heparin for today and  then consider transitioning to Eliquis in a.m.  Follows with Dr. Antoine Poche, cardiology as  outpatient.  Hematochezia  Earlier on patient had small amount of bright red bleeding per rectum.  Heparin was briefly held.  Appears to have resolved.  Continue to monitor.  Stage II chronic kidney disease  Stable.  Continue to trend daily BMP.  Body mass index is 28.66 kg/m.  Nutritional Status Nutrition Problem: Increased nutrient needs Etiology: acute illness (pneumoperitoneum r/t sigmoid diverticulitis with perforation/fistulous tract) Signs/Symptoms: estimated needs Interventions: TPN    DVT prophylaxis: SCDs Start: 03/20/20 0559.  Remains on IV heparin drip.   Code Status: Full Code Family Communication: Discussed in detail with patient's spouse via patient's speaker phone at bedside.  Updated care and answered all questions. Disposition:  Status is: Inpatient  Remains inpatient appropriate because:Inpatient level of care appropriate due to severity of illness   Dispo: The patient is from: Home              Anticipated d/c is to: Home              Patient currently is not medically stable to d/c.   Difficult to place patient No        Consultants:   General surgery Cardiology  Procedures:   Sigmoid colectomy, colostomy and Hartman's pouch formation 03/23/2020 Right upper arm PICC line.  Antimicrobials:    Anti-infectives (From admission, onward)   Start     Dose/Rate Route Frequency Ordered Stop   03/23/20 1000  clindamycin (CLEOCIN) 900 mg, gentamicin (GARAMYCIN) 240 mg in sodium chloride 0.9 % 1,000 mL for intraperitoneal lavage  Status:  Discontinued         Irrigation To Surgery 03/23/20 0724 03/23/20 1705   03/23/20 0815  cefoTEtan (CEFOTAN) 2 g in sodium chloride 0.9 % 100 mL IVPB        2 g 200 mL/hr over 30 Minutes Intravenous On call to O.R. 03/23/20 0724 03/23/20 1332   03/20/20 1400  piperacillin-tazobactam (ZOSYN) IVPB 3.375 g  Status:  Discontinued        3.375 g 12.5 mL/hr over 240 Minutes Intravenous Every 8 hours 03/20/20 0623 03/20/20  0636   03/20/20 1200  cefTRIAXone (ROCEPHIN) 2 g in sodium chloride 0.9 % 100 mL IVPB        2 g 200 mL/hr over 30 Minutes Intravenous Every 24 hours 03/20/20 0636     03/20/20 1200  metroNIDAZOLE (FLAGYL) IVPB 500 mg        500 mg 100 mL/hr over 60 Minutes Intravenous Every 8 hours 03/20/20 0636     03/20/20 0530  piperacillin-tazobactam (ZOSYN) IVPB 3.375 g        3.375 g 100 mL/hr over 30 Minutes Intravenous  Once 03/20/20 0516 03/20/20 0609        Subjective:  Patient seen this morning.  Reported that he was up in chair until 10 PM last night due to increased urine output from IV Lasix.  Did not sleep much last night until approximately 4:56 AM this morning.  States that he ambulated a couple times in the halls over the last 2 days.  Per nursing, had significant bleeding overnight at JP drain site which resolved after sustained pressure.  Patient denies pain  Objective:   Vitals:   03/28/20 1002 03/28/20 1100 03/28/20 1200 03/28/20 1300  BP: 137/71 (!) 142/93 (!) 142/82 135/74  Pulse: 79 70 66 75  Resp:  18 18 (!) 22  Temp:   98.2  F (36.8 C)   TempSrc:   Oral   SpO2:  90% 97% 97%  Weight:      Height:        General exam: Middle-age male, moderately built and nourished, lying comfortably propped up in bed without distress. Respiratory system: Clear to auscultation. Respiratory effort normal. Cardiovascular system: S1 & S2 heard, RRR. No JVD, murmurs, rubs, gallops or clicks.  Trace bilateral leg and upper extremity edema.  Telemetry personally reviewed: SR, BBB morphology.  QTC on telemetry 0.46 ms. Gastrointestinal system: Abdomen is nondistended, soft and nontender. No organomegaly or masses felt. Normal bowel sounds heard.  Midline surgical site dressing clean, dry and intact.  JP drain with minimal bloodstained fluid.  Hartman's pouch with mild dark liquid. Central nervous system: Alert and oriented. No focal neurological deficits. Extremities: Symmetric 5 x 5 power.   Right upper arm PICC line without acute findings.  Mild redness, swelling without warmth or tenderness on medial aspect of left lower part of upper arm. Skin: No rashes, lesions or ulcers Psychiatry: Judgement and insight appear normal. Mood & affect appropriate.     Data Reviewed:   I have personally reviewed following labs and imaging studies   CBC: Recent Labs  Lab 03/23/20 0759 03/24/20 0251 03/26/20 0807 03/27/20 0229 03/28/20 0550  WBC 11.2* 11.0* 11.4* 11.0* 11.0*  NEUTROABS 9.2* 9.8* 9.1*  --   --   HGB 14.2 13.2 12.6* 13.5 12.4*  HCT 43.4 40.7 38.6* 40.5 37.3*  MCV 93.9 94.9 93.7 92.5 91.9  PLT 219 211 211 236 241    Basic Metabolic Panel: Recent Labs  Lab 03/26/20 0807 03/27/20 0229 03/27/20 1245 03/28/20 0550  NA 138 136  --  137  K 3.8 3.6 4.0 3.5  CL 109 107  --  105  CO2 18* 23  --  24  GLUCOSE 145* 161*  --  136*  BUN 22 21  --  23  CREATININE 1.03 1.12  --  1.21  CALCIUM 7.7* 7.6*  --  7.7*  MG 2.1 2.1  --  2.0  PHOS 3.3 3.8  --  4.2    Liver Function Tests: Recent Labs  Lab 03/24/20 0251 03/26/20 0807  AST 12* 15  ALT 12 15  ALKPHOS 40 50  BILITOT 0.7 0.7  PROT 4.9* 5.3*  ALBUMIN 2.4* 2.5*    CBG: Recent Labs  Lab 03/27/20 2305 03/28/20 0538 03/28/20 1129  GLUCAP 141* 140* 136*    Microbiology Studies:   Recent Results (from the past 240 hour(s))  Resp Panel by RT-PCR (Flu A&B, Covid) Nasopharyngeal Swab     Status: None   Collection Time: 03/20/20  5:42 AM   Specimen: Nasopharyngeal Swab; Nasopharyngeal(NP) swabs in vial transport medium  Result Value Ref Range Status   SARS Coronavirus 2 by RT PCR NEGATIVE NEGATIVE Final    Comment: (NOTE) SARS-CoV-2 target nucleic acids are NOT DETECTED.  The SARS-CoV-2 RNA is generally detectable in upper respiratory specimens during the acute phase of infection. The lowest concentration of SARS-CoV-2 viral copies this assay can detect is 138 copies/mL. A negative result does not  preclude SARS-Cov-2 infection and should not be used as the sole basis for treatment or other patient management decisions. A negative result may occur with  improper specimen collection/handling, submission of specimen other than nasopharyngeal swab, presence of viral mutation(s) within the areas targeted by this assay, and inadequate number of viral copies(<138 copies/mL). A negative result must be combined with clinical  observations, patient history, and epidemiological information. The expected result is Negative.  Fact Sheet for Patients:  BloggerCourse.comhttps://www.fda.gov/media/152166/download  Fact Sheet for Healthcare Providers:  SeriousBroker.ithttps://www.fda.gov/media/152162/download  This test is no t yet approved or cleared by the [place] FDA and  has been authorized for detection and/or diagnosis of SARS-CoV-2 by FDA under an Emergency Use Authorization (EUA). This EUA will remain  in effect (meaning this test can be used) for the duration of the COVID-19 declaration under Section 564(b)(1) of the Act, 21 U.S.C.section 360bbb-3(b)(1), unless the authorization is terminated  or revoked sooner.       Influenza A by PCR NEGATIVE NEGATIVE Final   Influenza B by PCR NEGATIVE NEGATIVE Final    Comment: (NOTE) The Xpert Xpress SARS-CoV-2/FLU/RSV plus assay is intended as an aid in the diagnosis of influenza from Nasopharyngeal swab specimens and should not be used as a sole basis for treatment. Nasal washings and aspirates are unacceptable for Xpert Xpress SARS-CoV-2/FLU/RSV testing.  Fact Sheet for Patients: BloggerCourse.comhttps://www.fda.gov/media/152166/download  Fact Sheet for Healthcare Providers: SeriousBroker.ithttps://www.fda.gov/media/152162/download  This test is not yet approved or cleared by the [place] FDA and has been authorized for detection and/or diagnosis of SARS-CoV-2 by FDA under an Emergency Use Authorization (EUA). This EUA will remain in effect (meaning this test can be used) for the  duration of the COVID-19 declaration under Section 564(b)(1) of the Act, 21 U.S.C. section 360bbb-3(b)(1), unless the authorization is terminated or revoked.  Performed at [facility], [address] [place], [place] [address]   MRSA PCR Screening     Status: None   Collection Time: 03/20/20  9:39 AM   Specimen: Nasal Mucosa; Nasopharyngeal  Result Value Ref Range Status   MRSA by PCR NEGATIVE NEGATIVE Final    Comment:        The GeneXpert MRSA Assay (FDA approved for NASAL specimens only), is one component of a comprehensive MRSA colonization surveillance program. It is not intended to diagnose MRSA infection nor to guide or monitor treatment for MRSA infections. Performed at [facility], [address] [place], [place] [address]      Radiology Studies:  No results found.   Scheduled Meds:   . Chlorhexidine Gluconate Cloth  6 each Topical Once  . Chlorhexidine Gluconate Cloth  6 each Topical Daily  . [START ON 03/29/2020] cholecalciferol  1,000 Units Oral Daily  . diltiazem  240 mg Oral Daily  . feeding supplement  1 Container Oral BID BM  . furosemide  40 mg Intravenous BID  . insulin aspart  0-9 Units Subcutaneous Q6H  . mouth rinse  15 mL Mouth Rinse BID  . metoprolol tartrate  25 mg Oral BID  . sodium chloride flush  10-40 mL Intracatheter Q12H  . tamsulosin  0.4 mg Oral Daily    Continuous Infusions:   . sodium chloride 10 mL/hr at 03/27/20 1600  . amiodarone Stopped (03/28/20 1240)  . cefTRIAXone (ROCEPHIN)  IV Stopped (03/28/20 1227)  . heparin 1,600 Units/hr (03/28/20 1348)  . methocarbamol (ROBAXIN) IV 500 mg (03/28/20 1405)  . metronidazole Stopped (03/28/20 1056)  . niCARDipine Stopped (03/28/20 1104)  . TPN ADULT (ION) 80 mL/hr at 03/28/20 1348  . TPN ADULT (ION)       LOS: 8 days     Marcellus ScottAnand Valerian Jewel, MD, [place]FACP, [facility] CenterFHM. [facility]    To contact the attending provider between 7A-7P or the  covering provider during after hours 7P-7A, please log into the web site  [email or web address] and access using universal [place] password for that web site. If you do not have the password, please call the hospital operator.  03/28/2020, 2:17 PM

## 2020-03-29 DIAGNOSIS — N179 Acute kidney failure, unspecified: Secondary | ICD-10-CM | POA: Diagnosis not present

## 2020-03-29 DIAGNOSIS — K567 Ileus, unspecified: Secondary | ICD-10-CM

## 2020-03-29 DIAGNOSIS — K9189 Other postprocedural complications and disorders of digestive system: Secondary | ICD-10-CM | POA: Diagnosis not present

## 2020-03-29 DIAGNOSIS — N189 Chronic kidney disease, unspecified: Secondary | ICD-10-CM

## 2020-03-29 LAB — CBC
HCT: 36.3 % — ABNORMAL LOW (ref 39.0–52.0)
HCT: 37.4 % — ABNORMAL LOW (ref 39.0–52.0)
Hemoglobin: 12.1 g/dL — ABNORMAL LOW (ref 13.0–17.0)
Hemoglobin: 12.3 g/dL — ABNORMAL LOW (ref 13.0–17.0)
MCH: 30.9 pg (ref 26.0–34.0)
MCH: 30.7 pg (ref 26.0–34.0)
MCHC: 33.3 g/dL (ref 30.0–36.0)
MCHC: 32.9 g/dL (ref 30.0–36.0)
MCV: 92.8 fL (ref 80.0–100.0)
MCV: 93.3 fL (ref 80.0–100.0)
Platelets: 262 10*3/uL (ref 150–400)
Platelets: 288 10*3/uL (ref 150–400)
RBC: 3.91 MIL/uL — ABNORMAL LOW (ref 4.22–5.81)
RBC: 4.01 MIL/uL — ABNORMAL LOW (ref 4.22–5.81)
RDW: 14.3 % (ref 11.5–15.5)
RDW: 14.6 % (ref 11.5–15.5)
WBC: 12.6 10*3/uL — ABNORMAL HIGH (ref 4.0–10.5)
WBC: 13.7 10*3/uL — ABNORMAL HIGH (ref 4.0–10.5)
nRBC: 0 % (ref 0.0–0.2)
nRBC: 0 % (ref 0.0–0.2)

## 2020-03-29 LAB — COMPREHENSIVE METABOLIC PANEL
ALT: 18 U/L (ref 0–44)
AST: 12 U/L — ABNORMAL LOW (ref 15–41)
Albumin: 2.4 g/dL — ABNORMAL LOW (ref 3.5–5.0)
Alkaline Phosphatase: 55 U/L (ref 38–126)
Anion gap: 8 (ref 5–15)
BUN: 24 mg/dL — ABNORMAL HIGH (ref 8–23)
CO2: 23 mmol/L (ref 22–32)
Calcium: 7.7 mg/dL — ABNORMAL LOW (ref 8.9–10.3)
Chloride: 102 mmol/L (ref 98–111)
Creatinine, Ser: 1.29 mg/dL — ABNORMAL HIGH (ref 0.61–1.24)
GFR, Estimated: >60 mL/min (ref >60)
Glucose, Bld: 165 mg/dL — ABNORMAL HIGH (ref 70–99)
Potassium: 3.9 mmol/L (ref 3.5–5.1)
Sodium: 133 mmol/L — ABNORMAL LOW (ref 135–145)
Total Bilirubin: 0.6 mg/dL (ref 0.3–1.2)
Total Protein: 5 g/dL — ABNORMAL LOW (ref 6.5–8.1)

## 2020-03-29 LAB — GLUCOSE, CAPILLARY
Glucose-Capillary: 145 mg/dL — ABNORMAL HIGH (ref 70–99)
Glucose-Capillary: 144 mg/dL — ABNORMAL HIGH (ref 70–99)
Glucose-Capillary: 150 mg/dL — ABNORMAL HIGH (ref 70–99)

## 2020-03-29 LAB — MAGNESIUM: Magnesium: 2.1 mg/dL (ref 1.7–2.4)

## 2020-03-29 LAB — HEPARIN LEVEL (UNFRACTIONATED): Heparin Unfractionated: 0.63 IU/mL (ref 0.30–0.70)

## 2020-03-29 LAB — PHOSPHORUS: Phosphorus: 4.5 mg/dL (ref 2.5–4.6)

## 2020-03-29 MED ORDER — TRACE MINERALS CU-MN-SE-ZN 300-55-60-3000 MCG/ML IV SOLN
INTRAVENOUS | Status: AC
  Filled 2020-03-29: qty 416

## 2020-03-29 MED ORDER — PROSOURCE PLUS PO LIQD
30.0000 mL | Freq: Three times a day (TID) | ORAL | Status: DC
  Administered 2020-03-29 – 2020-04-02 (×12): 30 mL via ORAL
  Filled 2020-03-29 (×12): qty 30

## 2020-03-29 NOTE — Progress Notes (Addendum)
ANTICOAGULATION CONSULT NOTE - Follow Up Consult  Pharmacy Consult for Heparin Indication: atrial fibrillation  Allergies  Allergen Reactions  . Amlodipine Palpitations  . Augmentin [Amoxicillin-Pot Clavulanate] Other (See Comments)    Rash developed in 2022- Patient initially tolerated ~ 4 weeks of therapy. When he re-started augmentin at a later date, he developed a rash about 1 week into therapy. No shortness of breath or swelling.  . Lisinopril Other (See Comments)    Elevated  creatinine     Patient Measurements: Height: 5\' 8"  (172.7 cm) Weight: 85.3 kg (188 lb 0.8 oz) IBW/kg (Calculated) : 68.4 Heparin Dosing Weight: TBW  Vital Signs: Temp: 98.9 F (37.2 C) (03/10 1000) Temp Source: Oral (03/10 1000) BP: 158/76 (03/10 1000) Pulse Rate: 120 (03/10 1000)  Labs: Recent Labs    03/27/20 0229 03/28/20 0550 03/29/20 0442  HGB 13.5 12.4* 12.1*  HCT 40.5 37.3* 36.3*  PLT 236 241 262  HEPARINUNFRC 0.36 0.43 0.63  CREATININE 1.12 1.21 1.29*    Estimated Creatinine Clearance: 59.1 mL/min (A) (by C-G formula based on SCr of 1.29 mg/dL (H)).   Assessment: 63 yoM admitted with diverticulitis, microperforation.  PMH includes Afib on chronic apixaban anticoagulation which was held on admission and transitioned to Heparin IV pending surgery.  Heparin was held on 3/3 for hematochezia, and he is now s/p ex lap, partial colectomy, colostomy on 3/4.  Prophylactic Lovenox dosing was started on POD1, 3/5.  Pharmacy is now consulted to resume dosing Heparin - no bolus per Surgery.    Significant Events:  -3/3: Heparin held for hematochezia -3/4: Partial colectomy, colostomy -3/5: LMWH 40 mg DVT ppx -3/6: Heparin drip resumed post op with no bolus  03/29/20 11:55 AM   Patient now has oozing/frank blood through the JP drain. Heparin will be stopped and anticoagulation held per CCS.    Plan:   Heparin drip and associated monitoring labs discontinued  Pharmacy will sign off of  heparin consult. If/when determined safe to resume anticoagulation, please re-consult.  Thank you for allowing pharmacy to participate in this patient's care.    05/29/20, PharmD 03/29/20 12:15 PM

## 2020-03-29 NOTE — Progress Notes (Addendum)
6 Days Post-Op    CC: Abdominal pain  Subjective: He had some oozing from drain site during my exam.  I took the dressing down stitches still in place. New drain sponge placed.  Reports feeling hungry today. Tolerating CLD. Denies nausea or emesis. Got up to urinate a lot yesterday 2/2 lasix. Reports audible gas and a lot of stool in his ostomy pouch.  Objective: Vital signs in last 24 hours: Temp:  [97.7 F (36.5 C)-98.9 F (37.2 C)] 98.2 F (36.8 C) (03/10 0626) Pulse Rate:  [62-83] 71 (03/10 0626) Resp:  [13-22] 16 (03/10 0626) BP: (120-161)/(59-93) 134/76 (03/10 0626) SpO2:  [90 %-98 %] 97 % (03/10 0626) Weight:  [85.3 kg] 85.3 kg (03/10 0500) Last BM Date: 03/28/20 5137 IV 3515 urine 390 drain 325 ostomy Afebrile, blood pressure is stable but still on the nicardipine drip. Labs are stable. WT 81.6>>79.4>.85.5Kg Intake/Output from previous day: 03/09 0701 - 03/10 0700 In: 2289.5 [P.O.:220; I.V.:1346.8; IV Piggyback:722.7] Out: 4435 [Urine:3825; Drains:210; Stool:400] Intake/Output this shift: Total I/O In: 1380.9 [I.V.:1330.9; IV Piggyback:50] Out: -   General appearance: alert, cooperative and no distress Resp: clear to auscultation bilaterally GI: abd soft, appropriately tender, mild distention,  JP drain sanguinous fluid.  Still in place.  Ostomy bag has liquid stool and gas in it (just emptied overnight and 1/3 full during my exam, audible gas during my exam)  Lab Results:  Recent Labs    03/28/20 0550 03/29/20 0442  WBC 11.0* 12.6*  HGB 12.4* 12.1*  HCT 37.3* 36.3*  PLT 241 262    BMET Recent Labs    03/28/20 0550 03/29/20 0442  NA 137 133*  K 3.5 3.9  CL 105 102  CO2 24 23  GLUCOSE 136* 165*  BUN 23 24*  CREATININE 1.21 1.29*  CALCIUM 7.7* 7.7*   PT/INR No results for input(s): LABPROT, INR in the last 72 hours.  Recent Labs  Lab 03/24/20 0251 03/26/20 0807 03/29/20 0442  AST 12* 15 12*  ALT 12 15 18   ALKPHOS 40 50 55  BILITOT 0.7  0.7 0.6  PROT 4.9* 5.3* 5.0*  ALBUMIN 2.4* 2.5* 2.4*     Lipase     Component Value Date/Time   LIPASE 37 03/20/2020 0014     Medications: . Chlorhexidine Gluconate Cloth  6 each Topical Daily  . cholecalciferol  1,000 Units Oral Daily  . diltiazem  240 mg Oral Daily  . feeding supplement  1 Container Oral BID BM  . insulin aspart  0-9 Units Subcutaneous Q6H  . mouth rinse  15 mL Mouth Rinse BID  . metoprolol tartrate  25 mg Oral BID  . sodium chloride flush  10-40 mL Intracatheter Q12H  . tamsulosin  0.4 mg Oral Daily   . sodium chloride 10 mL/hr at 03/27/20 1600  . cefTRIAXone (ROCEPHIN)  IV Stopped (03/28/20 1227)  . heparin 1,600 Units/hr (03/29/20 05/29/20)  . methocarbamol (ROBAXIN) IV Stopped (03/29/20 0714)  . metronidazole 500 mg (03/29/20 0212)  . 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 0809 on 03/23/20 POD #6 - afebrile, VSS, WBC 12.5 from 11, re-check  in AM. - ostomy functioning, distention improving, appetite increasing - advance to soft diet, continue TPN, would be ok with decreasing TPN support in tonight's TPN bag but would not discontinue. -continue IS and pulm toilet -mobilize -cont abx therapy -BID dressing changes to midline wound -cont JP drain -WOC following for new colostomy teaching  QAE:SLPN/PYY ID: cefotetan x 1 3/4 preop; Rocephin/Flagyl 3/1 >> day 9 DVT: SCDs;heparin gtt  Plan: ileus resolving and patient clinically improving. Advance to Soft, continue TPN. Repeat CBC in AM - if WBC up-trending may need repeat CT abd/pelvis to rule out  post-operative abscess formation. Monitor JP drain - mild oozing on hep gtt but pt hemodynamically stable.  I spoke to the patients wife on the phone and answered her questions.   LOS: 9 days    Daniel Aguirre 03/29/2020 Please see Amion

## 2020-03-29 NOTE — Progress Notes (Signed)
PHARMACY - TOTAL PARENTERAL NUTRITION CONSULT NOTE   Indication: Prolonged ileus  Patient Measurements: Height: 5\' 8"  (172.7 cm) Weight: 85.3 kg (188 lb 0.8 oz) IBW/kg (Calculated) : 68.4 TPN AdjBW (KG): 79.4 Body mass index is 28.59 kg/m. 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 Patient was initially treated medically but decision was made for surgical intervention. Patient is now s/p sigmoid colectomy and colostomy with Hartman's pouch on 3/4.  He was over 5 days with no PO intake for surgery on 3/4, and anticipate prolonged ileus.  He is high risk for refeeding syndrome. Pharmacy was consulted to manage TPN  Glucose / Insulin: CBGs 129-165. Goal <180 -4 units SSI given / 24 hrs. No hx of DM noted. Electrolytes: Na (133) low; all others WNL including CorrCa (9). Amiodarone discontinued. Renal: SCr 1.29 slight increase, BUN 24 Hepatic: AST/ALT, Tbili WNL, TG wnl Intake / Output; MIVF: Unmeasured stool output x3; UOP 3825 mL, drain output 210 mL. - no mIVF Prealbumin:  12 (3/4) > 8.4 (3/5) > 13.4 (3/7) GI Surgeries / Procedures:  3/4 Sigmoid colectomy and colostomy and Hartman's pouch formation  Central access: Triple lumen PICC placed 3/5 TPN start date: 3/5  Nutritional Goals (per RD recommendation on 3/5): Kcal:  2100-2300, Protein:  120-135 gm, Fluid:  2.1-2.3 L  Goal TPN rate is 100 mL/hr (provides 125g of protein and 7 day average of 2194 kcals per day)  With Lipids (SMOF 22 g/L = 53g) on MWF only d/t national shortage: 2496 kcal/day  Without lipids on TTSS: 1968 kcal/day  -3/9: Due to volume status and hypertension, concentrated amino acids used to reduce total volume of TPN with new goal rate of 80 mL/hr providing 125 g protein, 55 g lipids, and 2483 kcal  Current Nutrition: TPN, soft diet -3/9:   CLD tolerated -3/10: Advancing to soft diet  Plan:  Per  CCS note, advancing diet today. Ok to start weaning TPN.  At 18:00:  Reduce TPN to 40 mL/hr (1/2 of goal rate)  No lipids. Lipids on MWF only due to shortage  Electrolytes in TPN: Increase Na  Na 32mEq/L  K 31mEq/L  Ca 23mEq/L  Mg 60mEq/L  Phos  20 mmol/L  Cl:Ac 1:2  Standard MVI and trace elements to TPN  CBGs and sensitive SSI q6h  MIVF per MD. None currently  Monitor TPN labs on Mon/Thurs. Recheck electrolytes with AM labs tomorrow  Monitor for ability to advance diet and potential to wean off of TPN tomorrow  4m, PharmD 03/29/2020 10:15 AM

## 2020-03-29 NOTE — Plan of Care (Signed)
  Problem: Education: Goal: Knowledge of General Education information will improve Description: Including pain rating scale, medication(s)/side effects and non-pharmacologic comfort measures Outcome: Progressing   Problem: Clinical Measurements: Goal: Will remain free from infection Outcome: Progressing   Problem: Nutrition: Goal: Adequate nutrition will be maintained Outcome: Progressing   Problem: Pain Managment: Goal: General experience of comfort will improve Outcome: Progressing   

## 2020-03-29 NOTE — Progress Notes (Signed)
Nutrition Follow-up  DOCUMENTATION CODES:   Not applicable  INTERVENTION:  - TPN weaning per Pharmacist and Surgery team. - will d/c Boost Breeze per patient request. - will order 30 ml Prosource Plus TID, each supplement provides 100 kcal and 15 grams protein.    NUTRITION DIAGNOSIS:   Increased nutrient needs related to acute illness (pneumoperitoneum r/t sigmoid diverticulitis with perforation/fistulous tract) as evidenced by estimated needs -ongoing  GOAL:   Patient will meet greater than or equal to 90% of their needs -met with TPN and PO intakes at this time.   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 PMH includes HTN, HLD, diastolic dysfunction, renal insufficiency, A fb, recent diverticulitis with abscess.  Significant Events: 3/1- admission for diverticulitis with interloop abscess, pelvic abscess, and SBO 3/2- diet advanced to CLD 3/4- NPO resumed; NGT placed; sigmoid colectomy and colostomy, Hartman's pouch formation 3/5- initial RD assessment; triple lumen PICC placed in R basilic; TPN initiated 3/6- NGT removed 3/9- diet advanced to CLD   Diet advanced from CLD to Soft today at [facility]. Patient was working on breakfast tray which was clears at the time of RD visit earlier this AM. He had drank a cup of juice and was planning to have jello.  He was interested in RD placing lunch order to arrive around the time his wife visits (noon): grits with butter, coffee with cream and sugar, orange juice, and chocolate pudding.  Patient has been having his wife feed him when she is present to ensure that he eats slowly to avoid discomfort or nausea. He reports not feeling up to trying frozen items  and that he often feels nauseated when he consumes broth.   Episodes of diverticulitis first began around Thanksgiving. He has cut back on alcohol since that time, although he was not drinking much at baseline prior to that. He has not noticed any other foods or beverages that have specifically been of concern to him. He does try to eat a balanced diet high in fiber and fruits and vegetables, and limited processed foods.   Weight has been stable since 3/6 but is up since admission. Non-pitting edema to BLE documented in the edema section of flow sheet.    He is receiving TPN at goal rate of 80 ml/hr but plan starting today at 1800 is to decrease to half rate of 40 ml/hr.  Patient does not like Boost Breeze and prefers not to receive it.    Labs reviewed; CBG: 145 mg/dl, Na: 133 mmol/l, BUN: 24 mg/dl, creatinine: 1.29 mg/dl, Ca: 7.7 mg/dl. Medications reviewed; 1000 units cholecalciferol/day, 40 mg IV lasix BID 3/8-3/10, sliding scale novolog, 10 mEq IV KCl x4 runs 3/9   Diet Order:   Diet Order            DIET SOFT Room service appropriate? Yes; Fluid consistency: Thin  Diet effective now                 EDUCATION NEEDS:   Education needs have been addressed  Skin:  Skin Assessment: Skin Integrity Issues: Skin Integrity Issues:: Incisions Incisions: abdominal (3/4)  Last BM:  3/10 (type 5 x1)  Height:   Ht Readings from Last 1 Encounters:  03/23/20 '5\' 8"'  (1.727 m)    Weight:   Wt Readings from  Last 1 Encounters:  03/29/20 85.3 kg     Estimated Nutritional Needs:  Kcal:  2100-2300 Protein:  120-135 gm Fluid:  2.1-2.3 L      Jarome Matin, MS, RD, LDN, CNSC Inpatient Clinical Dietitian RD pager # available in [place]  After hours/weekend pager # available in [facility]

## 2020-03-29 NOTE — Progress Notes (Signed)
ANTICOAGULATION CONSULT NOTE - Follow Up Consult  Pharmacy Consult for Heparin Indication: atrial fibrillation  Allergies  Allergen Reactions  . Amlodipine Palpitations  . Augmentin [Amoxicillin-Pot Clavulanate] Other (See Comments)    Rash developed in 2022- Patient initially tolerated ~ 4 weeks of therapy. When he re-started augmentin at a later date, he developed a rash about 1 week into therapy. No shortness of breath or swelling.  . Lisinopril Other (See Comments)    Elevated  creatinine     Patient Measurements: Height: 5\' 8"  (172.7 cm) Weight: 85.5 kg (188 lb 7.9 oz) IBW/kg (Calculated) : 68.4 Heparin Dosing Weight: TBW  Vital Signs: Temp: 97.7 F (36.5 C) (03/10 0130) Temp Source: Oral (03/10 0130) BP: 160/83 (03/10 0130) Pulse Rate: 75 (03/10 0130)  Labs: Recent Labs    03/27/20 0229 03/28/20 0550 03/29/20 0442  HGB 13.5 12.4* 12.1*  HCT 40.5 37.3* 36.3*  PLT 236 241 262  HEPARINUNFRC 0.36 0.43 0.63  CREATININE 1.12 1.21 1.29*    Estimated Creatinine Clearance: 59.1 mL/min (A) (by C-G formula based on SCr of 1.29 mg/dL (H)).   Assessment: 75 yoM admitted with diverticulitis, microperforation.  PMH includes Afib on chronic apixaban anticoagulation which was held on admission and transitioned to Heparin IV pending surgery.  Heparin was held on 3/3 for hematochezia, and he is now s/p ex lap, partial colectomy, colostomy on 3/4.  Prophylactic Lovenox dosing was started on POD1, 3/5.  Pharmacy is now consulted to resume dosing Heparin - no bolus per Surgery.    03/29/20 5:46 AM   HL 0.63 at goal  CBC stable  Of note, per doppler acute superficial vein thrombosis involving the left basilic vein  RN just changed dressing and reports some bleeding  Goal of Therapy:  Heparin level 0.3-0.7 units/ml Monitor platelets by anticoagulation protocol: Yes   Plan:  Continue heparin infusion at 1600 units/hr Follow for s/s of bleeding Daily HL/CBC  Thank you for  allowing pharmacy to participate in this patient's care.    05/29/20 RPh 03/29/2020, 5:47 AM

## 2020-03-29 NOTE — Consult Note (Signed)
WOC Nurse ostomy follow up Wife not able to make our arranged teaching today.  We agree to meet Friday at 11.  Spouse prefers she be present to learn with him. We will meet tomorrow as agreed.  Pouch is intact with soft green stool.  Maple Hudson MSN, RN, FNP-BC CWON Wound, Ostomy, Continence Nurse Pager [phone]

## 2020-03-29 NOTE — Progress Notes (Signed)
PROGRESS NOTE   Daniel Aguirre  ZOX:[id number]    DOB: 1950/05/05    DOA: 03/19/2020  PCP: Daisy Florooss, Charles Alan, MD   I have briefly reviewed patients previous medical records in [facility].  Chief Complaint  Patient presents with  . Abdominal Pain    Brief Narrative:  [age] year old male with medical history not limited to hypertension, CKD stage II, atrial fibrillation on Eliquis anticoagulation, recent hospitalization 01/22/2020-01/29/2020 for diverticulitis with perforation and abscess, not amenable to IR drainage secondary to location, general surgery and ID consulted, managed conservatively and discharged home on 4 weeks of oral Augmentin, had improved but then developed progressive left lower quadrant abdominal pain that started a week prior to admission.  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  Hospital course complicated by A. fib with RVR and uncontrolled hypertension, improved.   Assessment & Plan:  Principal Problem:   Bowel perforation (HCC) Active Problems:   A-fib (HCC)   Chronic kidney disease (CKD), stage II (mild)   Sigmoid diverticulitis with perforation/fistulous tract:  History as noted above with recent episode of diverticulitis with perforation and abscess in early January that was not amenable to IR drainage and was treated with IV followed by oral antibiotics.  CT abdomen and pelvis on admission showed pneumoperitoneum.  General surgery was consulted.  Plan was to initially treat with IV antibiotics and monitor.  Patient however did not progress well and continued to have abdominal pain and tenderness.  Now s/p sigmoid colectomy, colostomy and Hartman's pouch on 03/23/2020.  Now off morphine PCA.    Postop ileus improving.  Tolerating clear liquids and CCS advancing to soft diet today with plans to wean off  TPN.  WBC up to 12.5, continue IV antibiotics for today, follow CBC and if uptrending leukocytosis then may need to consider CT abdomen and pelvis to rule out postoperative abscess.  Currently with significant/frank blood in JP drain, remains on IV heparin, will discuss with CCS.  Hypertensive urgency/essential hypertension  SBP's on 3/7 were in the 200s, not controlled on clonidine patch, as needed IV hydralazine and scheduled IV metoprolol.  Thereby he was started on IV nicardipine drip.  Also received IV Lasix 40 mg every 12 hours x3 doses for fluid overload.  Much improved blood pressures.  Reasonable control.  IV nicardipine drip and clonidine patch discontinued 3/9.  Continue prior home dose of metoprolol 25 mg twice daily and diltiazem CD 240 mg daily.  Anasarca  Multifactorial due to volume resuscitation during acute illness earlier on, hypoalbuminemia and poor nutritional status.  +13.4 L since admission.  S/p IV Lasix 40 mg every 12 hourly x3 doses.  Volume status significantly improved.  Slight uptick in creatinine likely related to this.  Follow BMP in a.m.  Left upper extremity swelling  Likely related to anasarca and basilic vein SVT noted on venous Doppler.  Improved after IV Lasix.  Elevate upper extremity and monitor.  A. fib with RVR  Developed A. fib with RVR earlier in hospitalization.  This was not adequately controlled on IV metoprolol or switching to IV Cardizem drip.  Cardiology was consulted and patient was placed on IV amiodarone drip.  Patient reverted to normal sinus rhythm.  After discussing with general surgery, heparin anticoagulation was started.  Developed transient hematochezia which resolved.  Now has oozing of frank blood through the JP drain, remains on IV heparin, plan to discuss  with CCS.  IV amiodarone discontinued and started prior home metoprolol and Cardizem on 3/9.  Since patient has ongoing bleeding issues through JP drain site,  will continue IV heparin for today and then consider transitioning to Eliquis when bleeding no longer an issue.  Follows with Dr. Antoine Poche, cardiology as outpatient.  Remains in sinus rhythm.  Hematochezia  Earlier on patient had small amount of bright red bleeding per rectum.  Heparin was briefly held.  Resolved.  Continue to monitor.  Acute on stage II chronic kidney disease  Creatinine has gone up slightly from 1.21-1.29.  Likely Lasix related.  Follow BMP in a.m.  Body mass index is 28.59 kg/m.  Nutritional Status Nutrition Problem: Increased nutrient needs Etiology: acute illness (pneumoperitoneum r/t sigmoid diverticulitis with perforation/fistulous tract) Signs/Symptoms: estimated needs Interventions: TPN    DVT prophylaxis: SCDs Start: 03/20/20 0559.  Remains on IV heparin drip.   Code Status: Full Code Family Communication: Discussed in detail with patient's spouse via patient's speaker phone at bedside on 3/9.  Updated care and answered all questions.  Unable to reach spouse via phone today. Disposition:  Status is: Inpatient  Remains inpatient appropriate because:Inpatient level of care appropriate due to severity of illness   Dispo: The patient is from: Home              Anticipated d/c is to: Home              Patient currently is not medically stable to d/c.   Difficult to place patient No        Consultants:   General surgery Cardiology  Procedures:   Sigmoid colectomy, colostomy and Hartman's pouch formation 03/23/2020 Right upper arm PICC line.  Antimicrobials:    Anti-infectives (From admission, onward)   Start     Dose/Rate Route Frequency Ordered Stop   03/23/20 1000  clindamycin (CLEOCIN) 900 mg, gentamicin (GARAMYCIN) 240 mg in sodium chloride 0.9 % 1,000 mL for intraperitoneal lavage  Status:  Discontinued         Irrigation To Surgery 03/23/20 0724 03/23/20 1705   03/23/20 0815  cefoTEtan (CEFOTAN) 2 g in sodium chloride 0.9 % 100 mL  IVPB        2 g 200 mL/hr over 30 Minutes Intravenous On call to O.R. 03/23/20 0724 03/23/20 1332   03/20/20 1400  piperacillin-tazobactam (ZOSYN) IVPB 3.375 g  Status:  Discontinued        3.375 g 12.5 mL/hr over 240 Minutes Intravenous Every 8 hours 03/20/20 0623 03/20/20 0636   03/20/20 1200  cefTRIAXone (ROCEPHIN) 2 g in sodium chloride 0.9 % 100 mL IVPB        2 g 200 mL/hr over 30 Minutes Intravenous Every 24 hours 03/20/20 0636     03/20/20 1200  metroNIDAZOLE (FLAGYL) IVPB 500 mg        500 mg 100 mL/hr over 60 Minutes Intravenous Every 8 hours 03/20/20 0636     03/20/20 0530  piperacillin-tazobactam (ZOSYN) IVPB 3.375 g        3.375 g 100 mL/hr over 30 Minutes Intravenous  Once 03/20/20 0516 03/20/20 0609        Subjective:  Reports bleeding from JP drain that has been ongoing mildly overnight with dressing changes by nursing but seem to have somewhat worsened this morning.  Tolerated liquid diet.  Having some mucousy BMs.  Did not sleep well last night but declines sleeping aids.  Wants to get up and ambulate.   Objective:  Vitals:   03/28/20 2133 03/29/20 0130 03/29/20 0500 03/29/20 0626  BP: (!) 161/76 (!) 160/83  134/76  Pulse: 78 75  71  Resp: Temp: 98.9 F (37.2 C) 97.7 F (36.5 C)  98.2 F (36.8 C)  TempSrc: Oral Oral  Oral  SpO2: 98% 98%  97%  Weight:   85.3 kg   Height:        General exam: Middle-age male, moderately built and nourished, lying comfortably propped up in bed without distress. Respiratory system: Clear to auscultation.  No increased work of breathing. Cardiovascular system: S1 and S2 heard, RRR.  No JVD, murmurs.  Trace ankle edema.  Telemetry personally reviewed: Sinus rhythm with BBB morphology. Gastrointestinal system: Abdomen is nondistended, soft and nontender. No organomegaly or masses felt. Normal bowel sounds heard.  Midline surgical site dressing clean, dry and intact.  JP drain with small amount of frank red blood.   Hartman's pouch with liquid stools and gas. Central nervous system: Alert and oriented. No focal neurological deficits. Extremities: Symmetric 5 x 5 power.  Right upper arm PICC line without acute findings.  Mild redness, swelling without warmth or tenderness on medial aspect of left lower part of upper arm-improved. Skin: No rashes, lesions or ulcers Psychiatry: Judgement and insight appear normal. Mood & affect appropriate.     Data Reviewed:   I have personally reviewed following labs and imaging studies   CBC: Recent Labs  Lab 03/23/20 0759 03/24/20 0251 03/26/20 0807 03/27/20 0229 03/28/20 0550 03/29/20 0442  WBC 11.2* 11.0* 11.4* 11.0* 11.0* 12.6*  NEUTROABS 9.2* 9.8* 9.1*  --   --   --   HGB 14.2 13.2 12.6* 13.5 12.4* 12.1*  HCT 43.4 40.7 38.6* 40.5 37.3* 36.3*  MCV 93.9 94.9 93.7 92.5 91.9 92.8  PLT 219 211 211 236 241 262    Basic Metabolic Panel: Recent Labs  Lab 03/27/20 0229 03/27/20 1245 03/28/20 0550 03/29/20 0442  NA 136  --  137 133*  K 3.6 4.0 3.5 3.9  CL 107  --  105 102  CO2 23  --  24 23  GLUCOSE 161*  --  136* 165*  BUN 21  --  23 24*  CREATININE 1.12  --  1.21 1.29*  CALCIUM 7.6*  --  7.7* 7.7*  MG 2.1  --  2.0 2.1  PHOS 3.8  --  4.2 4.5    Liver Function Tests: Recent Labs  Lab 03/24/20 0251 03/26/20 0807 03/29/20 0442  AST 12* 15 12*  ALT ALKPHOS 40 50 55  BILITOT 0.7 0.7 0.6  PROT 4.9* 5.3* 5.0*  ALBUMIN 2.4* 2.5* 2.4*    CBG: Recent Labs  Lab 03/28/20 1840 03/28/20 2333 03/29/20 0559  GLUCAP 162* 150* 145*    Microbiology Studies:   Recent Results (from the past 240 hour(s))  Resp Panel by RT-PCR (Flu A&B, Covid) Nasopharyngeal Swab     Status: None   Collection Time: 03/20/20  5:42 AM   Specimen: Nasopharyngeal Swab; Nasopharyngeal(NP) swabs in vial transport medium  Result Value Ref Range Status   SARS Coronavirus 2 by RT PCR NEGATIVE NEGATIVE Final    Comment: (NOTE) SARS-CoV-2 target nucleic acids  are NOT DETECTED.  The SARS-CoV-2 RNA is generally detectable in upper respiratory specimens during the acute phase of infection. The lowest concentration of SARS-CoV-2 viral copies this assay can detect is 138 copies/mL. A negative result does not preclude SARS-Cov-2 infection and should  not be used as the sole basis for treatment or other patient management decisions. A negative result may occur with  improper specimen collection/handling, submission of specimen other than nasopharyngeal swab, presence of viral mutation(s) within the areas targeted by this assay, and inadequate number of viral copies(<138 copies/mL). A negative result must be combined with clinical observations, patient history, and epidemiological information. The expected result is Negative.  Fact Sheet for Patients:  BloggerCourse.com  Fact Sheet for Healthcare Providers:  SeriousBroker.it  This test is no t yet approved or cleared by the [place] FDA and  has been authorized for detection and/or diagnosis of SARS-CoV-2 by FDA under an Emergency Use Authorization (EUA). This EUA will remain  in effect (meaning this test can be used) for the duration of the COVID-19 declaration under Section 564(b)(1) of the Act, 21 U.S.C.section 360bbb-3(b)(1), unless the authorization is terminated  or revoked sooner.       Influenza A by PCR NEGATIVE NEGATIVE Final   Influenza B by PCR NEGATIVE NEGATIVE Final    Comment: (NOTE) The Xpert Xpress SARS-CoV-2/FLU/RSV plus assay is intended as an aid in the diagnosis of influenza from Nasopharyngeal swab specimens and should not be used as a sole basis for treatment. Nasal washings and aspirates are unacceptable for Xpert Xpress SARS-CoV-2/FLU/RSV testing.  Fact Sheet for Patients: BloggerCourse.com  Fact Sheet for Healthcare Providers: SeriousBroker.it  This test is  not yet approved or cleared by the [place] FDA and has been authorized for detection and/or diagnosis of SARS-CoV-2 by FDA under an Emergency Use Authorization (EUA). This EUA will remain in effect (meaning this test can be used) for the duration of the COVID-19 declaration under Section 564(b)(1) of the Act, 21 U.S.C. section 360bbb-3(b)(1), unless the authorization is terminated or revoked.  Performed at [facility], [address] [place], [place] [address]   MRSA PCR Screening     Status: None   Collection Time: 03/20/20  9:39 AM   Specimen: Nasal Mucosa; Nasopharyngeal  Result Value Ref Range Status   MRSA by PCR NEGATIVE NEGATIVE Final    Comment:        The GeneXpert MRSA Assay (FDA approved for NASAL specimens only), is one component of a comprehensive MRSA colonization surveillance program. It is not intended to diagnose MRSA infection nor to guide or monitor treatment for MRSA infections. Performed at [facility], [address], [place], [place] [address]      Radiology Studies:  No results found.   Scheduled Meds:   . Chlorhexidine Gluconate Cloth  6 each Topical Daily  . cholecalciferol  1,000 Units Oral Daily  . diltiazem  240 mg Oral Daily  . feeding supplement  1 Container Oral BID BM  . insulin aspart  0-9 Units Subcutaneous Q6H  . mouth rinse  15 mL Mouth Rinse BID  . metoprolol tartrate  25 mg Oral BID  . sodium chloride flush  10-40 mL Intracatheter Q12H  . tamsulosin  0.4 mg Oral Daily    Continuous Infusions:   . sodium chloride 10 mL/hr at 03/27/20 1600  . cefTRIAXone (ROCEPHIN)  IV Stopped (03/28/20 1227)  . heparin 1,600 Units/hr (03/29/20 1601)  . methocarbamol (ROBAXIN) IV Stopped (03/29/20 0714)  . metronidazole 500 mg (03/29/20 0212)  . TPN ADULT (ION) 80 mL/hr at 03/29/20 0808     LOS: 9 days     Marcellus Scott, MD, [place], [facility]. [facility]    To contact the attending  provider between 7A-7P or the covering provider during after hours 7P-7A, please log into the web site [email or web address] and access using universal [place] password for that web site. If you do not have the password, please call the hospital operator.  03/29/2020, 10:47 AM

## 2020-03-30 DIAGNOSIS — K567 Ileus, unspecified: Secondary | ICD-10-CM | POA: Diagnosis not present

## 2020-03-30 DIAGNOSIS — N179 Acute kidney failure, unspecified: Secondary | ICD-10-CM | POA: Diagnosis not present

## 2020-03-30 DIAGNOSIS — K9189 Other postprocedural complications and disorders of digestive system: Secondary | ICD-10-CM | POA: Diagnosis not present

## 2020-03-30 DIAGNOSIS — N189 Chronic kidney disease, unspecified: Secondary | ICD-10-CM | POA: Diagnosis not present

## 2020-03-30 LAB — CBC
HCT: 35.8 % — ABNORMAL LOW (ref 39.0–52.0)
Hemoglobin: 11.5 g/dL — ABNORMAL LOW (ref 13.0–17.0)
MCH: 30.4 pg (ref 26.0–34.0)
MCHC: 32.1 g/dL (ref 30.0–36.0)
MCV: 94.7 fL (ref 80.0–100.0)
Platelets: 258 10*3/uL (ref 150–400)
RBC: 3.78 MIL/uL — ABNORMAL LOW (ref 4.22–5.81)
RDW: 14.5 % (ref 11.5–15.5)
WBC: 12 10*3/uL — ABNORMAL HIGH (ref 4.0–10.5)
nRBC: 0 % (ref 0.0–0.2)

## 2020-03-30 LAB — BASIC METABOLIC PANEL
Anion gap: 8 (ref 5–15)
BUN: 27 mg/dL — ABNORMAL HIGH (ref 8–23)
CO2: 24 mmol/L (ref 22–32)
Calcium: 8.2 mg/dL — ABNORMAL LOW (ref 8.9–10.3)
Chloride: 104 mmol/L (ref 98–111)
Creatinine, Ser: 1.29 mg/dL — ABNORMAL HIGH (ref 0.61–1.24)
GFR, Estimated: >60 mL/min (ref >60)
Glucose, Bld: 113 mg/dL — ABNORMAL HIGH (ref 70–99)
Potassium: 4.8 mmol/L (ref 3.5–5.1)
Sodium: 136 mmol/L (ref 135–145)

## 2020-03-30 LAB — GLUCOSE, CAPILLARY
Glucose-Capillary: 110 mg/dL — ABNORMAL HIGH (ref 70–99)
Glucose-Capillary: 111 mg/dL — ABNORMAL HIGH (ref 70–99)
Glucose-Capillary: 117 mg/dL — ABNORMAL HIGH (ref 70–99)
Glucose-Capillary: 120 mg/dL — ABNORMAL HIGH (ref 70–99)

## 2020-03-30 LAB — PHOSPHORUS: Phosphorus: 4.2 mg/dL (ref 2.5–4.6)

## 2020-03-30 LAB — MAGNESIUM: Magnesium: 2 mg/dL (ref 1.7–2.4)

## 2020-03-30 MED ORDER — HEPARIN (PORCINE) 25000 UT/250ML-% IV SOLN
1400.0000 [IU]/h | INTRAVENOUS | Status: DC
  Administered 2020-03-30 – 2020-03-31 (×2): 1400 [IU]/h via INTRAVENOUS
  Filled 2020-03-30 (×2): qty 250

## 2020-03-30 NOTE — Progress Notes (Incomplete)
Pharmacy - IV heparin  Assessment:    Please see note from Christena Flake, PharmD earlier today for full details.  Briefly, 70 y.o. male on IV heparin for Afib while Eliquis on hold. Heparin held yesterday d/t bleeding around JP drain site, restarted today with reduced rate and heparin goal 0.3-0.5   Most recent heparin level ***therapeutic at *** on *** units/hr  *** bleeding or infusion issues per RN  Plan:   *** heparin *** units/hr  ***  Bernadene Person, PharmD, BCPS [phone] 03/30/2020, 2:48 PM

## 2020-03-30 NOTE — Progress Notes (Signed)
7 Days Post-Op    CC: Abdominal pain  Subjective: He had some oozing from drain site during my exam.  I took the dressing down stitches still in place. New drain sponge placed.  Reports feeling hungry today. Tolerating CLD. Denies nausea or emesis. Got up to urinate a lot yesterday 2/2 lasix. Reports audible gas and a lot of stool in his ostomy pouch.  Objective: Vital signs in last 24 hours: Temp:  [98.5 F (36.9 C)-98.9 F (37.2 C)] 98.5 F (36.9 C) (03/11 1027) Pulse Rate:  [69-82] 82 (03/11 1027) Resp:  [12-20] 12 (03/11 1027) BP: (121-141)/(71-84) 141/81 (03/11 1027) SpO2:  [96 %-100 %] 99 % (03/11 1027) Weight:  [86.2 kg] 86.2 kg (03/11 0529) Last BM Date: 03/30/20 5137 IV 3515 urine 390 drain 325 ostomy Afebrile, blood pressure is stable but still on the nicardipine drip. Labs are stable. WT 81.6>>79.4>.85.5Kg Intake/Output from previous day: 03/10 0701 - 03/11 0700 In: 3409.4 [P.O.:240; I.V.:2601; IV Piggyback:568.4] Out: 2640 [Urine:1850; Drains:40; Stool:750] Intake/Output this shift: Total I/O In: 472.8 [P.O.:240; I.V.:182.8; IV Piggyback:50] Out: 125 [Urine:125]  General appearance: alert, cooperative and no distress Resp: clear to auscultation bilaterally GI: abd soft, appropriately tender, mild distention,  JP drain sanguinous fluid.  Still in place.  Ostomy bag has liquid stool and gas in it (just emptied overnight and 1/3 full during my exam, audible gas during my exam)  Lab Results:  Recent Labs    03/29/20 1502 03/30/20 0335  WBC 13.7* 12.0*  HGB 12.3* 11.5*  HCT 37.4* 35.8*  PLT 288 258    BMET Recent Labs    03/29/20 0442 03/30/20 0335  NA 133* 136  K 3.9 4.8  CL 102 104  CO2 23 24  GLUCOSE 165* 113*  BUN 24* 27*  CREATININE 1.29* 1.29*  CALCIUM 7.7* 8.2*   PT/INR No results for input(s): LABPROT, INR in the last 72 hours.  Recent Labs  Lab 03/24/20 0251 03/26/20 0807 03/29/20 0442  AST 12* 15 12*  ALT 12 15 18   ALKPHOS 40  50 55  BILITOT 0.7 0.7 0.6  PROT 4.9* 5.3* 5.0*  ALBUMIN 2.4* 2.5* 2.4*     Lipase     Component Value Date/Time   LIPASE 37 03/20/2020 0014     Medications: . (feeding supplement) PROSource Plus  30 mL Oral TID BM  . Chlorhexidine Gluconate Cloth  6 each Topical Daily  . cholecalciferol  1,000 Units Oral Daily  . diltiazem  240 mg Oral Daily  . mouth rinse  15 mL Mouth Rinse BID  . metoprolol tartrate  25 mg Oral BID  . sodium chloride flush  10-40 mL Intracatheter Q12H  . tamsulosin  0.4 mg Oral Daily   . sodium chloride 10 mL/hr at 03/27/20 1600  . cefTRIAXone (ROCEPHIN)  IV Stopped (03/29/20 1259)  . methocarbamol (ROBAXIN) IV Stopped (03/30/20 05/30/20)  . metronidazole 500 mg (03/30/20 1035)  . TPN ADULT (ION) 40 mL/hr at 03/30/20 1019    Assessment/Plan Atrial fibrillation- on Eliquis pre admit;  -  heparin gtt and amio gtt curently Hypertension  - nicardipine gtt Mild renal insufficiency Hx C diff colitis K+ 3.6  >> getting supplement and work towards K+4.0  Complex H[place] 3 diverticulitis with interloop abscess, pelvic abscess, and small bowel obstruction Sigmoid colectomy and colostomy, Hartman's pouch formation Dr. 05/30/20 on 03/23/20 POD #7 - afebrile, VSS, WBC 12 from 13.7, monitor - ostomy functioning, distention improving, tolerating 100% of meals - continue soft diet,  D/C TPN - no further bleeding around JP, hgb 11.5 from 12.3 - relatively stable. Ok to resume hep gtt with no bolus. And repeat CBC in AM. -continue IS and pulm toilet -mobilize -BID dressing changes to midline wound -cont JP drain -WOC following for new colostomy teaching  UEK:CMKL/KJZ ID: cefotetan x 1 3/4 preop; Rocephin/Flagyl 3/1 >> day 9 DVT: SCDs;heparin gtt  Plan: resume hep gtt and monitor for bleeding, D/C TPN, continue current wound care. Will discuss abx therapy with MD. Will continue for now given leukocytosis and risk for post-op abscess. If WBC downtrending in Am then  would likely D/C abx. If increasing then may order repeat CT scan to rule out post-op abscess.     LOS: 10 days    Daniel Aguirre 03/30/2020 Please see Amion

## 2020-03-30 NOTE — Progress Notes (Signed)
PHARMACY - TOTAL PARENTERAL NUTRITION CONSULT NOTE   Indication: Prolonged ileus  Current Nutrition: TPN at 1/2 rate, soft diet -3/9:   CLD tolerated -3/10: Advancing to soft diet -3/11: 100% meal eaten  Plan:  Per CCS note, advancing diet today. TPN weaning started yesterday.  OK to stop TPN today per E. Simaan.   Stop TPN at 1800 today  Discontinue insulin  Discontinue associated TPN orders/labs  Thank you for allowing pharmacy to participate in this patient's care.   Carrson Lightcap P. Casimiro Needle, PharmD, BCPS Clinical Pharmacist [place] Please utilize Amion for appropriate phone number to reach the unit pharmacist [facility]) 03/30/2020 10:14 AM

## 2020-03-30 NOTE — Consult Note (Signed)
WOC Nurse ostomy follow up Wife at bedside and performs pouch change.   Stoma type/location: LLQ colostomy pink patent and producing liquid green stool.  I emptied 300 Ml from pouch prior to pouch change.  Stomal assessment/size: oval stoma 1.5 cm x 4 cm  with 0.3 separation noted at 9 o'clock.  This is covered with barrier ring.  Peristomal assessment: intact Treatment options for stomal/peristomal skin: barrier ring Output liquid green stool Ostomy pouching: 1pc.convex pouch with barrier ring Education provided: Custom pattern drawn onto pouch for cutting.  Explained why round hole is not ideal for his stoma She removes old pouch, cleans skin and applied barrier ring. She cuts new pouch to fit and applies to skin.  Rolls closed.  She feels more comfortable after this. Discuss emptying when 1/3 full and patient slides to end of chair and we simulate him emptying into toilet and how to clean opening with toilet paper wick. He is going to begin to practice this. Informed him he will need to be sure he has 5 pouches and barrier rings prior to discharge.  Unit does not have stock on the floor today.  Enrolled patient in [facility] Secure Start Discharge program: Yes Will follow.  Maple Hudson MSN, RN, FNP-BC CWON Wound, Ostomy, Continence Nurse Pager [phone]

## 2020-03-30 NOTE — Progress Notes (Signed)
PROGRESS NOTE   Daniel Aguirre  EEF:[id number]    DOB: 06/01/1950    DOA: 03/19/2020  PCP: Daisy Floro, MD   I have briefly reviewed patients previous medical records in [facility]  Chief Complaint  Patient presents with  . Abdominal Pain    Brief Narrative:  70 year old male with medical history not limited to hypertension, CKD stage II, atrial fibrillation on Eliquis anticoagulation, recent hospitalization 01/22/2020-01/29/2020 for diverticulitis with perforation and abscess, not amenable to IR drainage secondary to location, general surgery and ID consulted, managed conservatively and discharged home on 4 weeks of oral Augmentin, had improved but then developed progressive left lower quadrant abdominal pain that started a week prior to admission.  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  Hospital course complicated by A. fib with RVR and uncontrolled hypertension, improved.   Assessment & Plan:  Principal Problem:   Bowel perforation (HCC) Active Problems:   A-fib (HCC)   Chronic kidney disease (CKD), stage II (mild)   Sigmoid diverticulitis with perforation/fistulous tract:  History as noted above with recent episode of diverticulitis with perforation and abscess in early January that was not amenable to IR drainage and was treated with IV followed by oral antibiotics.  CT abdomen and pelvis on admission showed pneumoperitoneum.  General surgery was consulted.  Plan was to initially treat with IV antibiotics and monitor.  Patient however did not progress well and continued to have abdominal pain and tenderness.  Now s/p sigmoid colectomy, colostomy and Hartman's pouch on 03/23/2020.  Now off morphine PCA.    Postop ileus resolved.  Continue soft diet.  Plan to DC TPN tonight.  Patient had significant frank blood in JP  drain around drain site on 3/10.  After discussing with general surgery, IV heparin had been held.  No further active bleeding in or around JP drain today, hemoglobin relatively stable, discussed with surgical team in person and resumed heparin drip without bolus.  Monitor closely.  CC asked to advise regarding duration of antibiotics, continuing for now.  Hypertensive urgency/essential hypertension  SBP's on 3/7 were in the 200s, not controlled on clonidine patch, as needed IV hydralazine and scheduled IV metoprolol.  Thereby he was started on IV nicardipine drip.  Also received IV Lasix 40 mg every 12 hours x3 doses for fluid overload.  IV nicardipine drip and clonidine patch discontinued 3/9.    Continue prior home dose of metoprolol 25 mg twice daily and diltiazem CD 240 mg daily.  Reasonable control with occasional fluctuations  Anasarca  Multifactorial due to volume resuscitation during acute illness earlier on, hypoalbuminemia and poor nutritional status.  +13.4 L since admission.  S/p IV Lasix 40 mg every 12 hourly x3 doses couple of days back.  Volume status significantly improved.  Slight uptick in creatinine likely related to this.  Creatinine stable at 1.29.  Continue to trend daily BMP.  Left upper extremity swelling  Likely related to anasarca and basilic vein SVT noted on venous Doppler.  Improved after IV Lasix.  Elevate upper extremity and monitor.  A. fib with RVR  Developed A. fib with RVR earlier in hospitalization.  This was not adequately controlled on IV metoprolol or switching to IV Cardizem drip.  Cardiology was consulted and patient was placed on IV amiodarone drip.  Patient reverted to normal sinus rhythm.  After discussing with general surgery, heparin anticoagulation was started.  Developed transient hematochezia which resolved.  Heparin drip again held on 3/10 due to bleeding from and around JP drain site.  Resumed heparin back again 3/11.   Monitor for 24 hours and if no recurrence of bleeding then consider transitioning to home dose of Eliquis.  IV amiodarone discontinued and started prior home metoprolol and Cardizem on 3/9.  Follows with Dr. Antoine PocheHochrein, cardiology as outpatient.  Remains in sinus rhythm.  Hematochezia  Earlier on patient had small amount of bright red bleeding per rectum.  Heparin was briefly held.  Resolved.  Continue to monitor.  Acute on stage II chronic kidney disease  Creatinine has gone up slightly from 1.21-1.29 >1.29.  Likely Lasix related.  Follow BMP in a.m.  Body mass index is 28.89 kg/m.  Nutritional Status Nutrition Problem: Increased nutrient needs Etiology: acute illness (pneumoperitoneum r/t sigmoid diverticulitis with perforation/fistulous tract) Signs/Symptoms: estimated needs Interventions: TPN,Prostat    DVT prophylaxis: SCDs Start: 03/20/20 0559.  Remains on IV heparin drip.   Code Status: Full Code Family Communication: None at bedside today. Disposition:  Status is: Inpatient  Remains inpatient appropriate because:Inpatient level of care appropriate due to severity of illness   Dispo: The patient is from: Home              Anticipated d/c is to: Home probably in the next 3 to 4 days.              Patient currently is not medically stable to d/c.   Difficult to place patient No        Consultants:   General surgery Cardiology  Procedures:   Sigmoid colectomy, colostomy and Hartman's pouch formation 03/23/2020 Right upper arm PICC line.  Antimicrobials:    Anti-infectives (From admission, onward)   Start     Dose/Rate Route Frequency Ordered Stop   03/23/20 1000  clindamycin (CLEOCIN) 900 mg, gentamicin (GARAMYCIN) 240 mg in sodium chloride 0.9 % 1,000 mL for intraperitoneal lavage  Status:  Discontinued         Irrigation To Surgery 03/23/20 0724 03/23/20 1705   03/23/20 0815  cefoTEtan (CEFOTAN) 2 g in sodium chloride 0.9 % 100 mL IVPB        2 g 200  mL/hr over 30 Minutes Intravenous On call to O.R. 03/23/20 0724 03/23/20 1332   03/20/20 1400  piperacillin-tazobactam (ZOSYN) IVPB 3.375 g  Status:  Discontinued        3.375 g 12.5 mL/hr over 240 Minutes Intravenous Every 8 hours 03/20/20 0623 03/20/20 0636   03/20/20 1200  cefTRIAXone (ROCEPHIN) 2 g in sodium chloride 0.9 % 100 mL IVPB        2 g 200 mL/hr over 30 Minutes Intravenous Every 24 hours 03/20/20 0636     03/20/20 1200  metroNIDAZOLE (FLAGYL) IVPB 500 mg        500 mg 100 mL/hr over 60 Minutes Intravenous Every 8 hours 03/20/20 0636     03/20/20 0530  piperacillin-tazobactam (ZOSYN) IVPB 3.375 g        3.375 g 100 mL/hr over 30 Minutes Intravenous  Once 03/20/20 0516 03/20/20 [id number]        Subjective:  Feels much better.  Able to eat a full dinner last night and breakfast this morning.  Having stools in the Hartman's pouch.  No further bleeding from the JP drain site.  Slept much better last night.  Objective:   Vitals:   03/29/20 2100 03/30/20 0529 03/30/20 1027 03/30/20 1238  BP: 134/72  126/84 (!) 141/81 (!) 170/82  Pulse: 70 70 82 75  Resp:   12 16  Temp: 98.7 F (37.1 C) 98.9 F (37.2 C) 98.5 F (36.9 C) 98.6 F (37 C)  TempSrc: Oral Oral Oral Oral  SpO2: 98% 100% 99% 97%  Weight:  86.2 kg    Height:        General exam: Middle-age male, moderately built and nourished, comfortably sleeping on reclining chair on my arrival into the room.  Woke up and appeared to be in great spirits compared to yesterday. Respiratory system: Clear to auscultation.  No increased work of breathing. Cardiovascular system: S1 and S2 heard, RRR.  No JVD, murmurs or pedal edema.  Telemetry personally reviewed: Sinus rhythm. Gastrointestinal system: Abdomen is nondistended, soft and nontender. No organomegaly or masses felt. Normal bowel sounds heard.  Midline surgical site dressing clean, dry and intact.  JP drain with some dried blood likely from yesterday but no active bleeding  inside or around the drain.  Hartman's pouch with liquid stools and gas. Central nervous system: Alert and oriented. No focal neurological deficits. Extremities: Symmetric 5 x 5 power.  Right upper arm PICC line without acute findings.  Mild redness, swelling without warmth or tenderness on medial aspect of left lower part of upper arm-improved. Skin: No rashes, lesions or ulcers Psychiatry: Judgement and insight appear normal. Mood & affect appropriate.     Data Reviewed:   I have personally reviewed following labs and imaging studies   CBC: Recent Labs  Lab 03/24/20 0251 03/26/20 0807 03/27/20 0229 03/29/20 0442 03/29/20 1502 03/30/20 0335  WBC 11.0* 11.4*   < > 12.6* 13.7* 12.0*  NEUTROABS 9.8* 9.1*  --   --   --   --   HGB 13.2 12.6*   < > 12.1* 12.3* 11.5*  HCT 40.7 38.6*   < > 36.3* 37.4* 35.8*  MCV 94.9 93.7   < > 92.8 93.3 94.7  PLT 211 211   < > 262 288 258   < > = values in this interval not displayed.    Basic Metabolic Panel: Recent Labs  Lab 03/28/20 0550 03/29/20 0442 03/30/20 0335  NA 137 133* 136  K 3.5 3.9 4.8  CL 105 102 104  CO2 24 23 24   GLUCOSE 136* 165* 113*  BUN 23 24* 27*  CREATININE 1.21 1.29* 1.29*  CALCIUM 7.7* 7.7* 8.2*  MG 2.0 2.1 2.0  PHOS 4.2 4.5 4.2    Liver Function Tests: Recent Labs  Lab 03/24/20 0251 03/26/20 0807 03/29/20 0442  AST 12* 15 12*  ALT 12 15 18   ALKPHOS 40 50 55  BILITOT 0.7 0.7 0.6  PROT 4.9* 5.3* 5.0*  ALBUMIN 2.4* 2.5* 2.4*    CBG: Recent Labs  Lab 03/30/20 0007 03/30/20 0607 03/30/20 1236  GLUCAP 120* 117* 110*    Microbiology Studies:   No results found for this or any previous visit (from the past 240 hour(s)).   Radiology Studies:  No results found.   Scheduled Meds:   . (feeding supplement) PROSource Plus  30 mL Oral TID BM  . Chlorhexidine Gluconate Cloth  6 each Topical Daily  . cholecalciferol  1,000 Units Oral Daily  . diltiazem  240 mg Oral Daily  . mouth rinse  15 mL  Mouth Rinse BID  . metoprolol tartrate  25 mg Oral BID  . sodium chloride flush  10-40 mL Intracatheter Q12H  . tamsulosin  0.4 mg Oral Daily  Continuous Infusions:   . sodium chloride 10 mL/hr at 03/27/20 1600  . cefTRIAXone (ROCEPHIN)  IV Stopped (03/30/20 1326)  . heparin 1,400 Units/hr (03/30/20 1214)  . methocarbamol (ROBAXIN) IV Stopped (03/30/20 0539)  . metronidazole 500 mg (03/30/20 1035)  . TPN ADULT (ION) 40 mL/hr at 03/30/20 1019     LOS: 10 days     Marcellus Scott, MD, [place], [facility]. [facility]    To contact the attending provider between 7A-7P or the covering provider during after hours 7P-7A, please log into the web site [email or web address] and access using universal [place] password for that web site. If you do not have the password, please call the hospital operator.  03/30/2020, 1:40 PM

## 2020-03-30 NOTE — Progress Notes (Signed)
ANTICOAGULATION CONSULT NOTE - Initial Consult  Pharmacy Consult for heparin infusion Indication: atrial fibrillation  Allergies  Allergen Reactions  . Amlodipine Palpitations  . Augmentin [Amoxicillin-Pot Clavulanate] Other (See Comments)    Rash developed in 2022- Patient initially tolerated ~ 4 weeks of therapy. When he re-started augmentin at a later date, he developed a rash about 1 week into therapy. No shortness of breath or swelling.  . Lisinopril Other (See Comments)    Elevated  creatinine     Patient Measurements: Height: 5\' 8"  (172.7 cm) Weight: 86.2 kg (190 lb 0.6 oz) IBW/kg (Calculated) : 68.4 Heparin Dosing Weight: 86.2  Vital Signs: Temp: 98.5 F (36.9 C) (03/11 1027) Temp Source: Oral (03/11 1027) BP: 141/81 (03/11 1027) Pulse Rate: 82 (03/11 1027)  Labs: Recent Labs    03/28/20 0550 03/29/20 0442 03/29/20 1502 03/30/20 0335  HGB 12.4* 12.1* 12.3* 11.5*  HCT 37.3* 36.3* 37.4* 35.8*  PLT 241 262 288 258  HEPARINUNFRC 0.43 0.63  --   --   CREATININE 1.21 1.29*  --  1.29*    Estimated Creatinine Clearance: 59.3 mL/min (A) (by C-G formula based on SCr of 1.29 mg/dL (H)).   Medical History: Past Medical History:  Diagnosis Date  . CKD (chronic kidney disease), stage III (HCC)   . Diastolic dysfunction    a. 09/2015 Echo: EF 65-70%, no rwma, Gr1 DD, Mod AI. Asc AO 52mm. Mildly dil Asc Ao.  LA 62mm.  50m Dyslipidemia   . Hypertension   . Kidney stones   . Moderate aortic insufficiency    a. 09/2015 Echo: Mod AI.  10/2015 Right bundle branch block   . Testicular hypofunction     Medications:  PTA apixaban for afib Lovenox x 1 3/5 Heparin infusion 3/1-3/3, 3/6-3/10, 3/11 >>  Assessment: PMH includesAfib on chronic apixaban anticoagulation which was held on admission and transitioned to Heparin IV pending surgery. Heparin was held on 3/3 for hematochezia, and he is now s/p ex lap, partial colectomy, colostomyon 3/4.Prophylactic Lovenox dosing was  started on POD1, 3/5 and changed to heparin from 3/6-3/10.Heparin stopped yesterday due to oozing/frank blood through the JP drain and when heparin was infusing at 1600 units/hr.  Today, Pharmacy consulted toresumedosingHeparin - no bolus and use lower end of goal heparin level per Dr 02-08-1977.   Goal of Therapy:  Heparin level 0.3-0.5 units/ml Monitor platelets by anticoagulation protocol: Yes   Plan:  No heparin bolus Restart IV heparin at 1400 units/hr 6 hour heparin level Monitor daily heparin level, CBC, s/s bleeding F/U resuming oral anticoagulation when clinically appropriate  Suzane Vanderweide P. Waymon Amato, PharmD, BCPS Clinical Pharmacist [place] Please utilize Amion for appropriate phone number to reach the unit pharmacist [facility]) 03/30/2020 12:13 PM

## 2020-03-31 ENCOUNTER — Encounter (HOSPITAL_COMMUNITY): Payer: Self-pay | Admitting: Family Medicine

## 2020-03-31 DIAGNOSIS — I359 Nonrheumatic aortic valve disorder, unspecified: Secondary | ICD-10-CM | POA: Insufficient documentation

## 2020-03-31 DIAGNOSIS — R400 Somnolence: Secondary | ICD-10-CM | POA: Diagnosis present

## 2020-03-31 DIAGNOSIS — K567 Ileus, unspecified: Secondary | ICD-10-CM | POA: Diagnosis not present

## 2020-03-31 DIAGNOSIS — N189 Chronic kidney disease, unspecified: Secondary | ICD-10-CM | POA: Diagnosis not present

## 2020-03-31 DIAGNOSIS — E559 Vitamin D deficiency, unspecified: Secondary | ICD-10-CM | POA: Diagnosis present

## 2020-03-31 DIAGNOSIS — Z933 Colostomy status: Secondary | ICD-10-CM

## 2020-03-31 DIAGNOSIS — E291 Testicular hypofunction: Secondary | ICD-10-CM | POA: Diagnosis present

## 2020-03-31 DIAGNOSIS — R011 Cardiac murmur, unspecified: Secondary | ICD-10-CM | POA: Insufficient documentation

## 2020-03-31 DIAGNOSIS — H9319 Tinnitus, unspecified ear: Secondary | ICD-10-CM | POA: Diagnosis present

## 2020-03-31 DIAGNOSIS — N179 Acute kidney failure, unspecified: Secondary | ICD-10-CM | POA: Diagnosis not present

## 2020-03-31 DIAGNOSIS — K9189 Other postprocedural complications and disorders of digestive system: Secondary | ICD-10-CM | POA: Diagnosis not present

## 2020-03-31 LAB — CBC
HCT: 36.5 % — ABNORMAL LOW (ref 39.0–52.0)
Hemoglobin: 11.7 g/dL — ABNORMAL LOW (ref 13.0–17.0)
MCH: 30.4 pg (ref 26.0–34.0)
MCHC: 32.1 g/dL (ref 30.0–36.0)
MCV: 94.8 fL (ref 80.0–100.0)
Platelets: 297 10*3/uL (ref 150–400)
RBC: 3.85 MIL/uL — ABNORMAL LOW (ref 4.22–5.81)
RDW: 14.7 % (ref 11.5–15.5)
WBC: 11.7 10*3/uL — ABNORMAL HIGH (ref 4.0–10.5)
nRBC: 0 % (ref 0.0–0.2)

## 2020-03-31 LAB — BASIC METABOLIC PANEL
Anion gap: 8 (ref 5–15)
BUN: 28 mg/dL — ABNORMAL HIGH (ref 8–23)
CO2: 24 mmol/L (ref 22–32)
Calcium: 8.3 mg/dL — ABNORMAL LOW (ref 8.9–10.3)
Chloride: 102 mmol/L (ref 98–111)
Creatinine, Ser: 1.37 mg/dL — ABNORMAL HIGH (ref 0.61–1.24)
GFR, Estimated: 57 mL/min — ABNORMAL LOW (ref >60)
Glucose, Bld: 154 mg/dL — ABNORMAL HIGH (ref 70–99)
Potassium: 4.4 mmol/L (ref 3.5–5.1)
Sodium: 134 mmol/L — ABNORMAL LOW (ref 135–145)

## 2020-03-31 LAB — GLUCOSE, CAPILLARY
Glucose-Capillary: 103 mg/dL — ABNORMAL HIGH (ref 70–99)
Glucose-Capillary: 111 mg/dL — ABNORMAL HIGH (ref 70–99)
Glucose-Capillary: 124 mg/dL — ABNORMAL HIGH (ref 70–99)

## 2020-03-31 LAB — HEPARIN LEVEL (UNFRACTIONATED)
Heparin Unfractionated: 0.43 IU/mL (ref 0.30–0.70)
Heparin Unfractionated: 0.3 IU/mL (ref 0.30–0.70)

## 2020-03-31 MED ORDER — LACTATED RINGERS IV SOLN: INTRAVENOUS | Status: DC

## 2020-03-31 MED ORDER — APIXABAN 5 MG PO TABS
5.0000 mg | ORAL_TABLET | Freq: Two times a day (BID) | ORAL | Status: DC
  Administered 2020-03-31 – 2020-04-02 (×5): 5 mg via ORAL
  Filled 2020-03-31 (×5): qty 1

## 2020-03-31 NOTE — Plan of Care (Signed)
  Problem: Health Behavior/Discharge Planning: Goal: Ability to manage health-related needs will improve Outcome: Progressing   Problem: Activity: Goal: Risk for activity intolerance will decrease Outcome: Progressing   Problem: Nutrition: Goal: Adequate nutrition will be maintained Outcome: Progressing   

## 2020-03-31 NOTE — Progress Notes (Signed)
Pharmacy - IV heparin  Assessment:    Please see note from Christena Flake, PharmD earlier today for full details.  Briefly, 70 y.o. male on IV heparin for Afib while Eliquis on hold. Heparin held yesterday d/t bleeding around JP drain site, restarted today with reduced rate and heparin goal 0.3-0.5   Most recent heparin level 0.43 therapeutic on 1400 units/hr  No bleeding or infusion issues per RN  Plan:   Continue heparin drip at 1400 units/hr  Heparin level with am labs  Arley Phenix RPh 03/31/2020, 12:08 AM

## 2020-03-31 NOTE — Progress Notes (Signed)
ANTICOAGULATION CONSULT NOTE - Follow Up Consult  Pharmacy Consult for heparin infusion Indication: atrial fibrillation  Allergies  Allergen Reactions  . Amlodipine Palpitations  . Augmentin [Amoxicillin-Pot Clavulanate] Other (See Comments)    Rash developed in 2022- Patient initially tolerated ~ 4 weeks of therapy. When he re-started augmentin at a later date, he developed a rash about 1 week into therapy. No shortness of breath or swelling.  . Lisinopril Other (See Comments)    Elevated  creatinine     Patient Measurements: Height: 5\' 8"  (172.7 cm) Weight: 78 kg (171 lb 15.3 oz) IBW/kg (Calculated) : 68.4 Heparin Dosing Weight: 86.2  Vital Signs: Temp: 98.6 F (37 C) (03/12 0605) Temp Source: Oral (03/12 0605) BP: 121/67 (03/12 0605) Pulse Rate: 64 (03/12 0605)  Labs: Recent Labs    03/29/20 0442 03/29/20 1502 03/30/20 0335 03/30/20 2000 03/31/20 0314  HGB 12.1* 12.3* 11.5*  --  11.7*  HCT 36.3* 37.4* 35.8*  --  36.5*  PLT 262 288 258  --  297  HEPARINUNFRC 0.63  --   --  0.43 0.30  CREATININE 1.29*  --  1.29*  --  1.37*    Estimated Creatinine Clearance: 50.6 mL/min (A) (by C-G formula based on SCr of 1.37 mg/dL (H)).   Medications:  PTA apixaban for afib Lovenox x 1 3/5 Heparin infusion 3/1-3/3, 3/6-3/10, 3/11 >>  Assessment: PMH includesAfib on chronic apixaban anticoagulation which was held on admission and transitioned to Heparin IV pending surgery. Heparin was held on 3/3 for hematochezia, and he is now s/p ex lap, partial colectomy, colostomyon 3/4.Prophylactic Lovenox dosing was started on POD1, 3/5 and changed to heparin from 3/6-3/10.Heparin stopped yesterday due to oozing/frank blood through the JP drain and when heparin was infusing at 1600 units/hr.  Heparin infusion restarted 3/11 at 1400 units/hr with plan to not bolus and use lower end of therapeutic goal.  Goal of Therapy:  Heparin level 0.3-0.5 units/ml Monitor platelets by  anticoagulation protocol: Yes   Plan:  Continue IV heparin at 1400 units/hour Monitor daily heparin level, CBC, s/s bleeding F/U resuming oral anticoagulation when clinically appropriate  Vishnu Moeller P. 5/11, PharmD, BCPS Clinical Pharmacist [place] Please utilize Amion for appropriate phone number to reach the unit pharmacist [facility]) 03/31/2020 8:23 AM

## 2020-03-31 NOTE — Progress Notes (Signed)
Pt and spouse participated in incisional care. Pt teaching complete for incision care, spouse demonstrated proper technique, to change dressing and care of abdominal wound, using sterile technique, cleansing wound, applying wet to dry dressing, 4 x 4 folded and ABD pad as cover dressing and application of tape and proper technique of taping of wound with ostomy appliance lateral of incision. Questions addressed. Ongoing practical demonstration continues. SRP, RN

## 2020-03-31 NOTE — Progress Notes (Signed)
ANTICOAGULATION CONSULT NOTE - Follow Up Consult  Pharmacy Consult for heparin >> resume apixaban Indication: atrial fibrillation  Allergies  Allergen Reactions  . Amlodipine Palpitations  . Augmentin [Amoxicillin-Pot Clavulanate] Other (See Comments)    Rash developed in 2022- Patient initially tolerated ~ 4 weeks of therapy. When he re-started augmentin at a later date, he developed a rash about 1 week into therapy. No shortness of breath or swelling.  . Lisinopril Other (See Comments)    Elevated  creatinine     Patient Measurements: Height: 5\' 8"  (172.7 cm) Weight: 78 kg (171 lb 15.3 oz) IBW/kg (Calculated) : 68.4 Heparin Dosing Weight: 86.2  Vital Signs: Temp: 98.6 F (37 C) (03/12 1438) Temp Source: Oral (03/12 1438) BP: 151/83 (03/12 1438) Pulse Rate: 70 (03/12 1438)  Labs: Recent Labs    03/29/20 0442 03/29/20 1502 03/30/20 0335 03/30/20 2000 03/31/20 0314  HGB 12.1* 12.3* 11.5*  --  11.7*  HCT 36.3* 37.4* 35.8*  --  36.5*  PLT 262 288 258  --  297  HEPARINUNFRC 0.63  --   --  0.43 0.30  CREATININE 1.29*  --  1.29*  --  1.37*    Estimated Creatinine Clearance: 50.6 mL/min (A) (by C-G formula based on SCr of 1.37 mg/dL (H)).   Medications:  PTA apixaban for afib Lovenox x 1 3/5 Heparin infusion 3/1-3/3, 3/6-3/10, 3/11 >>  Assessment: PMH includesAfib on chronic apixaban anticoagulation which was held on admission and transitioned to Heparin IV pending surgery. Heparin was held on 3/3 for hematochezia, and he is now s/p ex lap, partial colectomy, colostomyon 3/4.Prophylactic Lovenox dosing was started on POD1, 3/5 and changed to heparin from 3/6-3/10.Heparin stopped yesterday due to oozing/frank blood through the JP drain and when heparin was infusing at 1600 units/hr.  Heparin infusion restarted 3/11 at 1400 units/hr with plan to not bolus and use lower end of therapeutic goal.  03/31/2020  To transition back to apixaban (ok per CCS and TRH)  Hgb  stable 11.7, Plts wnl  SCr increased 1.37  No ongoing active bleeding  Goal of Therapy:  Therapeutic anticoagulation, prevention of VTE/stoke Monitor platelets by anticoagulation protocol: Yes   Plan:  Stop IV heparin Resume apixaban 5mg  BID Pharmacy will sign off note writing but will continue to monitor for signs/symptoms of bleeding  05/31/2020, PharmD, BCPS Pharmacy: [phone] Please utilize Amion for appropriate phone number to reach the unit pharmacist [facility]) 03/31/2020 2:42 PM

## 2020-03-31 NOTE — Progress Notes (Signed)
8 Days Post-Op   Subjective/Chief Complaint: Patient doing well.  No more oozing from his drain site.  Ostomy working well.  He feels good and his appetite is improving.   Objective: Vital signs in last 24 hours: Temp:  [98.3 F (36.8 C)-98.6 F (37 C)] 98.6 F (37 C) (03/12 0605) Pulse Rate:  [64-80] 64 (03/12 0605) Resp:  [15-18] 15 (03/12 0605) BP: (121-170)/(67-85) 121/67 (03/12 0605) SpO2:  [94 %-98 %] 94 % (03/12 0605) Weight:  [78 kg] 78 kg (03/12 0500) Last BM Date: 03/31/20  Intake/Output from previous day: 03/11 0701 - 03/12 0700 In: 1498.8 [P.O.:360; I.V.:577.2; IV Piggyback:561.5] Out: 2530 [Urine:2000; Drains:30; Stool:500] Intake/Output this shift: No intake/output data recorded.   General appearance: alert, cooperative and no distress Resp: clear to auscultation bilaterally GI: abd soft, appropriately tender, mild distention,  JP drain sanguinous fluid.  Still in place.  Ostomy bag has liquid stool and gas in it wound is clean. Lab Results:  Recent Labs    03/30/20 0335 03/31/20 0314  WBC 12.0* 11.7*  HGB 11.5* 11.7*  HCT 35.8* 36.5*  PLT 258 297   BMET Recent Labs    03/30/20 0335 03/31/20 0314  NA 136 134*  K 4.8 4.4  CL 104 102  CO2 24 24  GLUCOSE 113* 154*  BUN 27* 28*  CREATININE 1.29* 1.37*  CALCIUM 8.2* 8.3*   PT/INR No results for input(s): LABPROT, INR in the last 72 hours. ABG No results for input(s): PHART, HCO3 in the last 72 hours.  Invalid input(s): PCO2, PO2  Studies/Results: No results found.  Anti-infectives: Anti-infectives (From admission, onward)   Start     Dose/Rate Route Frequency Ordered Stop   03/23/20 1000  clindamycin (CLEOCIN) 900 mg, gentamicin (GARAMYCIN) 240 mg in sodium chloride 0.9 % 1,000 mL for intraperitoneal lavage  Status:  Discontinued         Irrigation To Surgery 03/23/20 0724 03/23/20 1705   03/23/20 0815  cefoTEtan (CEFOTAN) 2 g in sodium chloride 0.9 % 100 mL IVPB        2 g 200 mL/hr  over 30 Minutes Intravenous On call to O.R. 03/23/20 0724 03/23/20 1332   03/20/20 1400  piperacillin-tazobactam (ZOSYN) IVPB 3.375 g  Status:  Discontinued        3.375 g 12.5 mL/hr over 240 Minutes Intravenous Every 8 hours 03/20/20 0623 03/20/20 0636   03/20/20 1200  cefTRIAXone (ROCEPHIN) 2 g in sodium chloride 0.9 % 100 mL IVPB        2 g 200 mL/hr over 30 Minutes Intravenous Every 24 hours 03/20/20 0636     03/20/20 1200  metroNIDAZOLE (FLAGYL) IVPB 500 mg        500 mg 100 mL/hr over 60 Minutes Intravenous Every 8 hours 03/20/20 0636     03/20/20 0530  piperacillin-tazobactam (ZOSYN) IVPB 3.375 g        3.375 g 100 mL/hr over 30 Minutes Intravenous  Once 03/20/20 0516 03/20/20 [id number]      Assessment/Plan:     Atrial fibrillation-on Eliquis pre admit; - heparin gtt and amio gtt curently Hypertension - nicardipine gtt Mild renal insufficiency Hx C diff colitis K+ 3.6 >>getting supplement and work towards K+4.0  Complex Hinchey 3 diverticulitis with interloop abscess, pelvic abscess, and small bowel obstruction Sigmoid colectomy and colostomy, Hartman's pouch formationDr. Trenten Watchman on 03/23/20 POD #7 - afebrile, VSS, WBC 11.7  from 13.7, monitor - ostomy functioning, distention improving, tolerating 100% of meals - continue soft  diet, D/C TPN - no further bleeding around JP, hgb 11.5 from 12.3 - relatively stable. Ok to resume hep gtt with no bolus. And repeat CBC in AM. -continue IS and pulm toilet -mobilize -BID dressing changes to midline wound -cont JP drain -WOC following for new colostomy teaching  ZSW:FUXN/ATF ID: cefotetan x 1 3/4 preop; Rocephin/Flagyl 3/1 >> day 9 DVT: SCDs;heparin gtt  Dortha Schwalbe MD  03/31/2020

## 2020-03-31 NOTE — Progress Notes (Signed)
PROGRESS NOTE   Daniel Aguirre  ZOX:[id number]    DOB: 07/12/1950    DOA: 03/19/2020  PCP: Daisy Floro, MD   I have briefly reviewed patients previous medical records in [facility].  Chief Complaint  Patient presents with  . Abdominal Pain    Brief Narrative:  70 year old male with medical history not limited to hypertension, CKD stage II, atrial fibrillation on Eliquis anticoagulation, recent hospitalization 01/22/2020-01/29/2020 for diverticulitis with perforation and abscess, not amenable to IR drainage secondary to location, general surgery and ID consulted, managed conservatively and discharged home on 4 weeks of oral Augmentin, had improved but then developed progressive left lower quadrant abdominal pain that started a week prior to admission.  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  Hospital course complicated by A. fib with RVR and uncontrolled hypertension, improved.   Assessment & Plan:  Principal Problem:   Bowel perforation (HCC) Active Problems:   A-fib (HCC)   Chronic kidney disease (CKD), stage II (mild)   Sigmoid diverticulitis with perforation/fistulous tract:  History as noted above with recent episode of diverticulitis with perforation and abscess in early January that was not amenable to IR drainage and was treated with IV followed by oral antibiotics.  CT abdomen and pelvis on admission showed pneumoperitoneum.  General surgery was consulted.  Plan was to initially treat with IV antibiotics and monitor.  Patient however did not progress well and continued to have abdominal pain and tenderness.  Now s/p sigmoid colectomy, colostomy and Hartman's pouch on 03/23/2020.  Postop ileus resolved.  Tolerating diet.  TPN discontinued.  Patient had significant frank blood in JP drain around drain site on 3/10.   After discussing with general surgery, IV heparin was briefly held, bleeding stopped, IV heparin resumed, no further ongoing active bleeding and hemoglobin stable.  I discussed with CCS MD on call and okay to transition to home Eliquis.  CCS to advise regarding duration of antibiotics, continuing for now.  Hypertensive urgency/essential hypertension  SBP's on 3/7 were in the 200s, not controlled on clonidine patch, as needed IV hydralazine and scheduled IV metoprolol.  Thereby he was started on IV nicardipine drip.  Also received IV Lasix 40 mg every 12 hours x3 doses for fluid overload.  IV nicardipine drip and clonidine patch discontinued 3/9.    Continue prior home dose of metoprolol 25 mg twice daily and diltiazem CD 240 mg daily.  Reasonable control with occasional fluctuations  Anasarca  Multifactorial due to volume resuscitation during acute illness earlier on, hypoalbuminemia and poor nutritional status.  +13.4 L since admission.  S/p IV Lasix 40 mg every 12 hourly x3 doses couple of days back.  Volume status significantly improved.   Left upper extremity swelling  Likely related to anasarca and basilic vein SVT noted on venous Doppler.  Improved after IV Lasix.  Elevate upper extremity and monitor.  A. fib with RVR  Developed A. fib with RVR earlier in hospitalization.  This was not adequately controlled on IV metoprolol or switching to IV Cardizem drip.  Cardiology was consulted and patient was placed on IV amiodarone drip.  Patient reverted to normal sinus rhythm.  After discussing with general surgery, heparin anticoagulation was started.  Developed transient hematochezia which resolved.  Heparin drip again held on 3/10 due to bleeding from and around JP drain site.  Resumed heparin back again 3/11.  Monitor for 24 hours  and if no recurrence of bleeding then consider transitioning to home dose of Eliquis.  IV amiodarone discontinued and started prior home  metoprolol and Cardizem on 3/9.  Follows with Dr. Antoine Poche, cardiology as outpatient.  Remains in sinus rhythm.  Discontinued telemetry 3/12  Hematochezia  Earlier on patient had small amount of bright red bleeding per rectum.  Heparin was briefly held.  Resolved.  Continue to monitor.  Acute on stage II chronic kidney disease  Creatinine has steadily increased 1.21-1.29 >1.29 >1.37.  Oral intake seems to be adequate.  Encouraged increased fluid intake ad lib.  Gentle IV fluids.  Follow BMP in a.m.  Avoid nephrotoxic's.  Body mass index is 26.15 kg/m.  Nutritional Status Nutrition Problem: Increased nutrient needs Etiology: acute illness (pneumoperitoneum r/t sigmoid diverticulitis with perforation/fistulous tract) Signs/Symptoms: estimated needs Interventions: TPN,Prostat    DVT prophylaxis: SCDs Start: 03/20/20 0559.  Remains on IV heparin drip.  We will transition to Eliquis today.   Code Status: Full Code Family Communication: None at bedside today. Disposition:  Status is: Inpatient  Remains inpatient appropriate because:Inpatient level of care appropriate due to severity of illness   Dispo: The patient is from: Home              Anticipated d/c is to: Home probably in the next 48 hours              Patient currently is not medically stable to d/c.   Difficult to place patient No        Consultants:   General surgery Cardiology  Procedures:   Sigmoid colectomy, colostomy and Hartman's pouch formation 03/23/2020 Right upper arm PICC line.  Antimicrobials:    Anti-infectives (From admission, onward)   Start     Dose/Rate Route Frequency Ordered Stop   03/23/20 1000  clindamycin (CLEOCIN) 900 mg, gentamicin (GARAMYCIN) 240 mg in sodium chloride 0.9 % 1,000 mL for intraperitoneal lavage  Status:  Discontinued         Irrigation To Surgery 03/23/20 0724 03/23/20 1705   03/23/20 0815  cefoTEtan (CEFOTAN) 2 g in sodium chloride 0.9 % 100 mL IVPB        2  g 200 mL/hr over 30 Minutes Intravenous On call to O.R. 03/23/20 0724 03/23/20 1332   03/20/20 1400  piperacillin-tazobactam (ZOSYN) IVPB 3.375 g  Status:  Discontinued        3.375 g 12.5 mL/hr over 240 Minutes Intravenous Every 8 hours 03/20/20 0623 03/20/20 0636   03/20/20 1200  cefTRIAXone (ROCEPHIN) 2 g in sodium chloride 0.9 % 100 mL IVPB        2 g 200 mL/hr over 30 Minutes Intravenous Every 24 hours 03/20/20 0636     03/20/20 1200  metroNIDAZOLE (FLAGYL) IVPB 500 mg        500 mg 100 mL/hr over 60 Minutes Intravenous Every 8 hours 03/20/20 0636     03/20/20 0530  piperacillin-tazobactam (ZOSYN) IVPB 3.375 g        3.375 g 100 mL/hr over 30 Minutes Intravenous  Once 03/20/20 0516 03/20/20 0609        Subjective:  Continues to do well.  No bleeding from or around JP site.  No abdominal pain.  Tolerating diet well.  Learning to work the [employer] by himself.  Objective:   Vitals:   03/30/20 1238 03/30/20 2001 03/31/20 0500 03/31/20 0605  BP: (!) 170/82 (!) 155/85  121/67  Pulse: 75 80  64  Resp: 16 18  15  Temp: 98.6 F (37 C) 98.3 F (36.8 C)  98.6 F (37 C)  TempSrc: Oral Oral  Oral  SpO2: 97% 98%  94%  Weight:   78 kg   Height:        General exam: Middle-age male, moderately built and nourished, sitting up comfortably in reclining chair this morning. Respiratory system: Clear to auscultation.  No increased work of breathing. Cardiovascular system: S1 and S2 heard, RRR.  No JVD, murmurs or pedal edema.  Telemetry personally reviewed: Sinus rhythm. Gastrointestinal system: Abdomen is nondistended, soft and nontender. No organomegaly or masses felt. Normal bowel sounds heard.  Midline surgical site dressing clean, dry and intact.  JP drain without active bleeding.  Hartman's pouch with liquid stools and gas. Central nervous system: Alert and oriented. No focal neurological deficits. Extremities: Symmetric 5 x 5 power.  Right upper arm PICC line without acute  findings.  Mild redness, swelling without warmth or tenderness on medial aspect of left lower part of upper arm-improved. Skin: No rashes, lesions or ulcers Psychiatry: Judgement and insight appear normal. Mood & affect appropriate.     Data Reviewed:   I have personally reviewed following labs and imaging studies   CBC: Recent Labs  Lab 03/26/20 0807 03/27/20 0229 03/29/20 1502 03/30/20 0335 03/31/20 0314  WBC 11.4*   < > 13.7* 12.0* 11.7*  NEUTROABS 9.1*  --   --   --   --   HGB 12.6*   < > 12.3* 11.5* 11.7*  HCT 38.6*   < > 37.4* 35.8* 36.5*  MCV 93.7   < > 93.3 94.7 94.8  PLT 211   < > 288 258 297   < > = values in this interval not displayed.    Basic Metabolic Panel: Recent Labs  Lab 03/28/20 0550 03/29/20 0442 03/30/20 0335 03/31/20 0314  NA 137 133* 136 134*  K 3.5 3.9 4.8 4.4  CL 105 102 104 102  CO2 24 23 24 24   GLUCOSE 136* 165* 113* 154*  BUN 23 24* 27* 28*  CREATININE 1.21 1.29* 1.29* 1.37*  CALCIUM 7.7* 7.7* 8.2* 8.3*  MG 2.0 2.1 2.0  --   PHOS 4.2 4.5 4.2  --     Liver Function Tests: Recent Labs  Lab 03/26/20 0807 03/29/20 0442  AST 15 12*  ALT 15 18  ALKPHOS 50 55  BILITOT 0.7 0.6  PROT 5.3* 5.0*  ALBUMIN 2.5* 2.4*    CBG: Recent Labs  Lab 03/30/20 2349 03/31/20 0621 03/31/20 1143  GLUCAP 111* 103* 111*    Microbiology Studies:   No results found for this or any previous visit (from the past 240 hour(s)).   Radiology Studies:  No results found.   Scheduled Meds:   . (feeding supplement) PROSource Plus  30 mL Oral TID BM  . Chlorhexidine Gluconate Cloth  6 each Topical Daily  . cholecalciferol  1,000 Units Oral Daily  . diltiazem  240 mg Oral Daily  . mouth rinse  15 mL Mouth Rinse BID  . metoprolol tartrate  25 mg Oral BID  . sodium chloride flush  10-40 mL Intracatheter Q12H  . tamsulosin  0.4 mg Oral Daily    Continuous Infusions:   . sodium chloride 1,000 mL (03/30/20 2341)  . cefTRIAXone (ROCEPHIN)  IV 2  g (03/31/20 1210)  . heparin 1,400 Units/hr (03/31/20 1117)  . lactated ringers 50 mL/hr at 03/31/20 0613  . methocarbamol (ROBAXIN) IV 500 mg (03/31/20 1302)  .  metronidazole 500 mg (03/31/20 0951)     LOS: 11 days     Marcellus Scott, MD, [place], [facility]. [facility]    To contact the attending provider between 7A-7P or the covering provider during after hours 7P-7A, please log into the web site [email or web address] and access using universal [place] password for that web site. If you do not have the password, please call the hospital operator.  03/31/2020, 2:22 PM

## 2020-04-01 DIAGNOSIS — N179 Acute kidney failure, unspecified: Secondary | ICD-10-CM | POA: Diagnosis not present

## 2020-04-01 LAB — GLUCOSE, CAPILLARY
Glucose-Capillary: 114 mg/dL — ABNORMAL HIGH (ref 70–99)
Glucose-Capillary: 128 mg/dL — ABNORMAL HIGH (ref 70–99)

## 2020-04-01 LAB — BASIC METABOLIC PANEL
Anion gap: 8 (ref 5–15)
BUN: 28 mg/dL — ABNORMAL HIGH (ref 8–23)
CO2: 24 mmol/L (ref 22–32)
Calcium: 8.2 mg/dL — ABNORMAL LOW (ref 8.9–10.3)
Chloride: 103 mmol/L (ref 98–111)
Creatinine, Ser: 1.39 mg/dL — ABNORMAL HIGH (ref 0.61–1.24)
GFR, Estimated: 56 mL/min — ABNORMAL LOW (ref >60)
Glucose, Bld: 112 mg/dL — ABNORMAL HIGH (ref 70–99)
Potassium: 4.5 mmol/L (ref 3.5–5.1)
Sodium: 135 mmol/L (ref 135–145)

## 2020-04-01 LAB — CBC
HCT: 35.5 % — ABNORMAL LOW (ref 39.0–52.0)
Hemoglobin: 11.7 g/dL — ABNORMAL LOW (ref 13.0–17.0)
MCH: 30.9 pg (ref 26.0–34.0)
MCHC: 33 g/dL (ref 30.0–36.0)
MCV: 93.7 fL (ref 80.0–100.0)
Platelets: 332 10*3/uL (ref 150–400)
RBC: 3.79 MIL/uL — ABNORMAL LOW (ref 4.22–5.81)
RDW: 14.6 % (ref 11.5–15.5)
WBC: 8.8 10*3/uL (ref 4.0–10.5)
nRBC: 0 % (ref 0.0–0.2)

## 2020-04-01 NOTE — Progress Notes (Signed)
Dressing change completed to midline abdominal incision and RLQ drain site, wound pink with granulation tissue, saline moistened gauze placed and then covered with dry gauze and an ABD pad, drain sponge changed around JP drain site, hyperfix tape placed along edges.

## 2020-04-01 NOTE — Plan of Care (Signed)
  Problem: Education: Goal: Knowledge of General Education information will improve Description: Including pain rating scale, medication(s)/side effects and non-pharmacologic comfort measures Outcome: Progressing   Problem: Clinical Measurements: Goal: Ability to maintain clinical measurements within normal limits will improve Outcome: Progressing Goal: Will remain free from infection Outcome: Progressing   

## 2020-04-01 NOTE — Progress Notes (Addendum)
Spouse completed incisional care, using sterile technique and following orders. Pt and spouse completed wound care with minimal assistance/instructions. Questions addressed. Reviewed briefly basic signs of healthy wound/incision healing from nursing care. Starter supplies given to pt and wife for home care...24 hrs dressing supplies until home health team initial visit. SRP, RN

## 2020-04-01 NOTE — Progress Notes (Signed)
9 Days Post-Op   Subjective/Chief Complaint: Doing well  No complaints  Wife available by phone   Objective: Vital signs in last 24 hours: Temp:  [98.1 F (36.7 C)-98.6 F (37 C)] 98.1 F (36.7 C) (03/13 0649) Pulse Rate:  [67-79] 67 (03/13 0649) Resp:  [14-20] 14 (03/13 0649) BP: (120-160)/(63-83) 120/63 (03/13 0649) SpO2:  [96 %-97 %] 97 % (03/13 0649) Last BM Date: 03/31/20  Intake/Output from previous day: 03/12 0701 - 03/13 0700 In: 2753.9 [P.O.:1200; I.V.:1065.5; IV Piggyback:488.4] Out: 2105 [Urine:2100; Drains:5] Intake/Output this shift: Total I/O In: 385.3 [I.V.:385.3] Out: 400 [Urine:400]   General appearance: alert, cooperative and no distress Resp: clear to auscultation bilaterally GI: abd soft, appropriately tender, mild distention, JP drain sanguinous fluid. Still in place. Ostomy bag has liquid stool and gas in it wound is clean.    Lab Results:  Recent Labs    03/31/20 0314 04/01/20 0500  WBC 11.7* 8.8  HGB 11.7* 11.7*  HCT 36.5* 35.5*  PLT 297 332   BMET Recent Labs    03/31/20 0314 04/01/20 0500  NA 134* 135  K 4.4 4.5  CL 102 103  CO2 24 24  GLUCOSE 154* 112*  BUN 28* 28*  CREATININE 1.37* 1.39*  CALCIUM 8.3* 8.2*   PT/INR No results for input(s): LABPROT, INR in the last 72 hours. ABG No results for input(s): PHART, HCO3 in the last 72 hours.  Invalid input(s): PCO2, PO2  Studies/Results: No results found.  Anti-infectives: Anti-infectives (From admission, onward)   Start     Dose/Rate Route Frequency Ordered Stop   03/23/20 1000  clindamycin (CLEOCIN) 900 mg, gentamicin (GARAMYCIN) 240 mg in sodium chloride 0.9 % 1,000 mL for intraperitoneal lavage  Status:  Discontinued         Irrigation To Surgery 03/23/20 0724 03/23/20 1705   03/23/20 0815  cefoTEtan (CEFOTAN) 2 g in sodium chloride 0.9 % 100 mL IVPB        2 g 200 mL/hr over 30 Minutes Intravenous On call to O.R. 03/23/20 0724 03/23/20 1332   03/20/20 1400   piperacillin-tazobactam (ZOSYN) IVPB 3.375 g  Status:  Discontinued        3.375 g 12.5 mL/hr over 240 Minutes Intravenous Every 8 hours 03/20/20 0623 03/20/20 0636   03/20/20 1200  cefTRIAXone (ROCEPHIN) 2 g in sodium chloride 0.9 % 100 mL IVPB        2 g 200 mL/hr over 30 Minutes Intravenous Every 24 hours 03/20/20 0636     03/20/20 1200  metroNIDAZOLE (FLAGYL) IVPB 500 mg        500 mg 100 mL/hr over 60 Minutes Intravenous Every 8 hours 03/20/20 0636     03/20/20 0530  piperacillin-tazobactam (ZOSYN) IVPB 3.375 g        3.375 g 100 mL/hr over 30 Minutes Intravenous  Once 03/20/20 0516 03/20/20 3235      Assessment/Plan: Atrial fibrillation-on Eliquis pre admit; - heparin gtt and amio gtt curently Hypertension - nicardipine gtt Mild renal insufficiency Hx C diff colitis K+ 3.6 >>getting supplement and work towards K+4.0  Complex Hinchey 3 diverticulitis with interloop abscess, pelvic abscess, and small bowel obstruction Sigmoid colectomy and colostomy, Hartman's pouch formationDr. Cornett on 03/23/20 POD #12   - afebrile, VSS, WBC8- ostomy functioning, distention improving,tolerating 100% of meals -continue soft diet, D/C TPN - no further bleeding around JP,  D/C ABX Monday D/C drain Monday  Ok to D/C Monday as long as Cr stable   -continue  IS and pulm toilet -mobilize -BID dressing changes to midline wound -cont JP drain -WOC following for new colostomy teaching  ION:GEXB ID: [email or web address] x 12 3/4 preop; Rocephin/Flagyl 12 d/c monady  DVT: SCDs;eloquis         LOS: 12 days    Dortha Schwalbe MD  04/01/2020

## 2020-04-01 NOTE — Discharge Instructions (Addendum)
04/01/20: Spouse has starter dressing change supplies for abdominal incision: NS, ABD pads, 4 x 4's, 2 x 2's, hypafix tape, 4 pairs of sterile gloves until Home Health Nurse begin initial visit.   WOUND CARE: - dressing to be changed twice daily - supplies: sterile saline, kerlix/guaze, scissors, ABD pads, tape  - remove dressing and all packing carefully, moistening with sterile saline as needed to avoid packing/internal dressing sticking to the wound. - clean edges of skin around the wound with water/gauze, making sure there is no tape debris or leakage left on skin that could cause skin irritation or breakdown. - dampen clean kerlix/gauze with sterile saline and pack wound from wound base to skin level, making sure to take note of any possible areas of wound tracking, tunneling and packing appropriately. Wound can be packed loosely. Trim kerlix/gauze to size if a whole roll/piece is not required. - cover wound with a dry ABD pad and secure with tape.  - write the date/time on the dry dressing/tape to better track when the last dressing change occurred. - apply any skin protectant/powder recommended by clinician to protect skin/skin folds. - change dressing as needed if leakage occurs, wound gets contaminated, or patient requests to shower. - patient may shower daily with wound open and following the shower the wound should be dried and a clean dressing placed.   [facility]      [facility], [place] [phone]  OPEN ABDOMINAL SURGERY: POST OP INSTRUCTIONS  Always review your discharge instruction sheet given to you by the facility where your surgery was performed.  IF YOU HAVE DISABILITY OR FAMILY LEAVE FORMS, YOU MUST BRING THEM TO THE OFFICE FOR PROCESSING.  PLEASE DO NOT GIVE THEM TO YOUR DOCTOR.  1. A prescription for pain medication may be given to you upon discharge.  Take your pain medication as prescribed, if needed.  If narcotic pain medicine is not needed, then you may take  acetaminophen (Tylenol) or ibuprofen (Advil) as needed. 2. Take your usually prescribed medications unless otherwise directed. 3. If you need a refill on your pain medication, please contact your pharmacy. They will contact our office to request authorization.  Prescriptions will not be filled after 5pm or on week-ends. 4. You should follow a light diet the first few days after arrival home, such as soup and crackers, pudding, etc.unless your doctor has advised otherwise. A high-fiber, low fat diet can be resumed as tolerated.   Be sure to include lots of fluids daily. Most patients will experience some swelling and bruising on the chest and neck area.  Ice packs will help.  Swelling and bruising can take several days to resolve 5. Most patients will experience some swelling and bruising in the area of the incision. Ice pack will help. Swelling and bruising can take several days to resolve..  6. It is common to experience some constipation if taking pain medication after surgery.  Increasing fluid intake and taking a stool softener will usually help or prevent this problem from occurring.  A mild laxative (Milk of Magnesia or Miralax) should be taken according to package directions if there are no bowel movements after 48 hours. 7.  You may have steri-strips (small skin tapes) in place directly over the incision.  These strips should be left on the skin for 7-10 days.  If your surgeon used skin glue on the incision, you may shower in 24 hours.  The glue will flake off over the next 2-3 weeks.  Any sutures  or staples will be removed at the office during your follow-up visit. You may find that a light gauze bandage over your incision may keep your staples from being rubbed or pulled. You may shower and replace the bandage daily. 8. ACTIVITIES:  You may resume regular (light) daily activities beginning the next day--such as daily self-care, walking, climbing stairs--gradually increasing activities as tolerated.   You may have sexual intercourse when it is comfortable.  Refrain from any heavy lifting or straining until approved by your doctor. a. You may drive when you no longer are taking prescription pain medication, you can comfortably wear a seatbelt, and you can safely maneuver your car and apply brakes - at least 2 weeks from now if able  9. You should see your doctor in the office for a follow-up appointment approximately two weeks after your surgery.  Make sure that you call for this appointment within a day or two after you arrive home to insure a convenient appointment time.  WHEN TO CALL YOUR DOCTOR: 1. Fever over 101.0 2. Inability to urinate 3. Nausea and/or vomiting 4. Extreme swelling or bruising 5. Continued bleeding from incision. 6. Increased pain, redness, or drainage from the incision. 7. Difficulty swallowing or breathing 8. Muscle cramping or spasms. 9. Numbness or tingling in hands or feet or around lips.  The clinic staff is available to answer your questions during regular business hours.  Please don't hesitate to call and ask to speak to one of the nurses if you have concerns.  For further questions, please visit [email or web address]   Protein Content in Foods Protein is a necessary nutrient in any diet. It helps build and repair muscles, bones, and skin. Depending on your overall health, you may need more or less protein in your diet. You are encouraged to eat a variety of protein foods to ensure that you get all the essential nutrients that are found in different protein foods. Talk to your health care provider or dietitian about how much protein you need each day and which sources of protein are best for you. Protein is especially important for:  Repairing and making cells and tissues.  Fighting infection.  Energy.  Growth and development. See the following list for the protein content of some common foods. What are tips for getting more protein in your  diet?  Try to replace processed carbohydrates with high-quality protein.  Snack on nuts and seeds instead of chips.  Replace baked desserts with [place] yogurt.  Eat protein foods from both plant and animal sources.  Replace red meat with seafood choices. What foods are high in proteins? High-protein foods contain 4 grams (g) or more of protein per serving. They include: Meat protein  Beef, ground sirloin (cooked) -- 3 oz (85 g) have 24 g of protein.  Chicken breast, boneless and skinless (cooked) -- 3 oz (85 g) have 25 g of protein.  Egg -- 1 egg has 6 g of protein.  Fish, filet (cooked) -- 1 oz (28 g) has 6-7 g of protein.  Lamb (cooked) -- 3 oz (85 g) has 24 g of protein.  Pork tenderloin (cooked) -- 3 oz (85 g) has 23 g of protein.  Tuna (canned in water) -- 3 oz (85 g) has 20 g of protein. Dairy  Cottage cheese -- 1/2 cup (114 g) has 13.4 g of protein.  Milk -- 1 cup (250 mL) has 8 g of protein.  Cheese (hard) -- 1 oz (28 g) has 7 g  of protein.  Yogurt, regular -- 6 oz (170 g) has 8 g of protein.  Greek yogurt -- 6 oz (200 g) has 18 g protein. Plant protein  Garbanzo beans (canned or cooked) -- 1/2 cup (130 g) has 6-7 g of protein.  Kidney beans (canned or cooked) -- 1/2 cup (130 g) has 6-7 g of protein.  Nuts (peanuts, pistachios, almonds) -- 1 oz (28 g) has 6 g of protein.  Peanut butter -- 1 oz (32 g) has 7-8 g of protein.  Pumpkin seeds -- 1 oz (28 g) has 8.5 g of protein.  Soybeans (roasted) -- 1 oz (28 g) has 8 g of protein.  Soybeans (cooked) -- 1/2 cup (90 g) has 11 g of protein.  Soy milk -- 1 cup (250 mL) has 5-10 g of protein.  Soy or vegetable patty -- 1 patty has 11 g of protein.  Sunflower seeds -- 1 oz (28 g) has 5.5 g of protein.  Tofu (firm) -- 1/2 cup (124 g) has 20 g of protein.  Tempeh -- 1/2 cup (83 g) has 16 g of protein. The items listed above may not be a complete list of foods high in protein. Actual amounts of protein may  differ depending on processing. Contact a dietitian for more information.   What foods are low in protein? Low-protein foods contain 3 grams (g) or less of protein per serving. They include: Fruits  Fruit or vegetable juice -- 1/2 cup (125 mL) has 1 g of protein. Vegetables  Beets (raw or cooked) -- 1/2 cup (68 g) has 1.5 g of protein.  Broccoli (raw or cooked) -- 1/2 cup (44 g) has 2 g of protein.  Collard greens (raw or cooked) -- 1/2 cup (42 g) has 2 g of protein.  Green beans (raw or cooked) -- 1/2 cup (83 g) has 1 g of protein.  Green peas (canned) -- 1/2 cup (80 g) has 3.5 g of protein.  Potato (baked with skin) -- 1 medium potato (173 g) has 3 g of protein.  Spinach (cooked) -- 1/2 cup (90 g) has 3 g of protein.  Squash (cooked) -- 1/2 cup (90 g) has 1.5 g of protein.  Avocado -- 1 cup (146 g) has 2.7 g of protein. Grains  Bran cereal -- 1/2 cup (30 g) has 2-3 g of protein.  Bread -- 1 slice has 2.5 g of protein.  Corn (fresh or cooked) -- 1/2 cup (77 g) has 2 g of protein.  Flour tortilla -- One 6-inch (15 cm) tortilla has 2.5 g of protein.  Muffins -- 1 small muffin (2 oz or 57 g) has 3 g of protein.  Oatmeal (cooked) -- 1/2 cup (40 g) has 3 g of protein.  Rice (cooked) -- 1/2 cup (79 g) has 2.5-3.5 g of protein. Dairy  Cream cheese -- 1 oz (29 g) has 2 g of protein.  Creamer (half-and-half) -- 1 oz (29 mL) has 1 g of protein.  Frozen yogurt -- 1/2 cup (72 g) has 3 g of protein.  Sour cream -- 1/2 cup (75 g) has 2.5 g of protein. The items listed above may not be a complete list of foods low in protein. Actual amounts of protein may differ depending on processing. Contact a dietitian for more information.   Summary  Protein is a nutrient that your body needs for growth and development, repairing and making cells and tissues, fighting infection, and providing energy.  Protein is  in both plant and animal foods. Some of these foods have more protein than  others.  Depending on your overall health, you may need more or less protein in your diet. Talk to your health care provider about how much protein you need. This information is not intended to replace advice given to you by your health care provider. Make sure you discuss any questions you have with your health care provider. Document Revised: 01/05/2019 Document Reviewed: 01/05/2019 Elsevier Patient Education  2021 [employer].   Additional discharge instructions  Please get your medications reviewed and adjusted by your Primary MD.  Please request your Primary MD to go over all Hospital Tests and Procedure/Radiological results at the follow up, please get all Hospital records sent to your Prim MD by signing hospital release before you go home.  If you had Pneumonia of Lung problems at the Hospital: Please get a 2 view Chest X ray done in 6-8 weeks after hospital discharge or sooner if instructed by your Primary MD.  If you have Congestive Heart Failure: Please call your Cardiologist or Primary MD anytime you have any of the following symptoms:  1) 3 pound weight gain in 24 hours or 5 pounds in 1 week  2) shortness of breath, with or without a dry hacking cough  3) swelling in the hands, feet or stomach  4) if you have to sleep on extra pillows at night in order to breathe  Follow cardiac low salt diet and 1.5 lit/day fluid restriction.  If you have diabetes Accuchecks 4 times/day, Once in AM empty stomach and then before each meal. Log in all results and show them to your primary doctor at your next visit. If any glucose reading is under 80 or above 300 call your primary MD immediately.  If you have Seizure/Convulsions/Epilepsy: Please do not drive, operate heavy machinery, participate in activities at heights or participate in high speed sports until you have seen by Primary MD or a Neurologist and advised to do so again.  If you had Gastrointestinal Bleeding: Please ask your  Primary MD to check a complete blood count within one week of discharge or at your next visit. Your endoscopic/colonoscopic biopsies that are pending at the time of discharge, will also need to followed by your Primary MD.  Get Medicines reviewed and adjusted. Please take all your medications with you for your next visit with your Primary MD  Please request your Primary MD to go over all hospital tests and procedure/radiological results at the follow up, please ask your Primary MD to get all Hospital records sent to his/her office.  If you experience worsening of your admission symptoms, develop shortness of breath, life threatening emergency, suicidal or homicidal thoughts you must seek medical attention immediately by calling 911 or calling your MD immediately  if symptoms less severe.  You must read complete instructions/literature along with all the possible adverse reactions/side effects for all the Medicines you take and that have been prescribed to you. Take any new Medicines after you have completely understood and accpet all the possible adverse reactions/side effects.   Do not drive or operate heavy machinery when taking Pain medications.   Do not take more than prescribed Pain, Sleep and Anxiety Medications  Special Instructions: If you have smoked or chewed Tobacco  in the last 2 yrs please stop smoking, stop any regular Alcohol  and or any Recreational drug use.  Wear Seat belts while driving.  Please note You were cared for  by a hospitalist during your hospital stay. If you have any questions about your discharge medications or the care you received while you were in the hospital after you are discharged, you can call the unit and asked to speak with the hospitalist on call if the hospitalist that took care of you is not available. Once you are discharged, your primary care physician will handle any further medical issues. Please note that NO REFILLS for any discharge medications  will be authorized once you are discharged, as it is imperative that you return to your primary care physician (or establish a relationship with a primary care physician if you do not have one) for your aftercare needs so that they can reassess your need for medications and monitor your lab values.  You can reach the hospitalist office at phone [phone] or fax [phone]   If you do not have a primary care physician, you can call [phone] for a physician referral.

## 2020-04-01 NOTE — Plan of Care (Signed)
  Problem: Pain Managment: Goal: General experience of comfort will improve Outcome: Progressing   Problem: Elimination: Goal: Will not experience complications related to bowel motility Outcome: Progressing Goal: Will not experience complications related to urinary retention Outcome: Progressing   Problem: Safety: Goal: Ability to remain free from injury will improve Outcome: Progressing   

## 2020-04-01 NOTE — Progress Notes (Addendum)
PROGRESS NOTE   CHOU BUSLER  DGL:[id number]    DOB: May 17, 1950    DOA: 03/19/2020  PCP: Daisy Floro, MD   I have briefly reviewed patients previous medical records in [facility].  Chief Complaint  Patient presents with  . Abdominal Pain    Brief Narrative:  70 year old male with medical history not limited to hypertension, CKD stage II, atrial fibrillation on Eliquis anticoagulation, recent hospitalization 01/22/2020-01/29/2020 for diverticulitis with perforation and abscess, not amenable to IR drainage secondary to location, general surgery and ID consulted, managed conservatively and discharged home on 4 weeks of oral Augmentin, had improved but then developed progressive left lower quadrant abdominal pain that started a week prior to admission.  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  Hospital course complicated by A. fib with RVR and uncontrolled hypertension, improved.   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 Active Problems:   Atrial fibrillation (HCC)   Essential hypertension   HLD (hyperlipidemia)   Chronic kidney disease with active medical management without dialysis, stage 3 (moderate) (HCC)   Nonrheumatic aortic valve insufficiency   Intra-abdominal abscess (HCC)   BPH (benign prostatic hyperplasia)   Daytime somnolence   Testicular hypofunction   Tinnitus   Vitamin D deficiency   Colostomy in place [facility])   Sigmoid diverticulitis with perforation/fistulous tract:  History as noted above with recent episode of diverticulitis with perforation and abscess in early January that was not amenable to IR drainage and was treated with IV followed by oral antibiotics.  CT abdomen and pelvis on admission showed  pneumoperitoneum.  General surgery was consulted.  Plan was to initially treat with IV antibiotics and monitor.  Patient however did not progress well and continued to have abdominal pain and tenderness.  Now s/p sigmoid colectomy, colostomy and Hartman's pouch on 03/23/2020.  Postop ileus resolved.  Tolerating diet.  TPN discontinued.  Patient had significant frank blood in JP drain around drain site on 3/10.  After discussing with general surgery, IV heparin was briefly held, bleeding stopped, IV heparin resumed, no further ongoing active bleeding and hemoglobin stable.  Heparin changed back to home Eliquis on 3/12 without recurrence of bleeding.  Overall doing well.  Tolerating diet.  Ambulating.  Having stools through ostomy.  Patient and spouse have been educated and able to manage it by themselves.  [facility] follow-up appreciated.  Discussed with Dr. Luisa Hart.  Plan for DC antibiotics, DC drain and likely DC home on 3/14.  Hypertensive urgency/essential hypertension  SBP's on 3/7 were in the 200s, not controlled on clonidine patch, as needed IV hydralazine and scheduled IV metoprolol.  Thereby he was started on IV nicardipine drip.  Also received IV Lasix 40 mg every 12 hours x3 doses for fluid overload.  IV nicardipine drip and clonidine patch discontinued 3/9.    Continue prior home dose of metoprolol 25 mg twice daily and diltiazem CD 240 mg daily.  Reasonable control with occasional fluctuations  Anasarca  Multifactorial due to volume resuscitation during acute illness earlier on, hypoalbuminemia and poor nutritional status.  +13.4 L since admission.  S/p IV Lasix 40 mg every 12 hourly x3 doses couple of days back.  Volume status significantly improved.   Left upper extremity swelling  Likely related to anasarca and basilic vein SVT noted on venous Doppler.  Improved after IV  Lasix.  Elevate upper extremity and monitor.  A. fib with RVR  Developed A. fib with RVR  earlier in hospitalization.  This was not adequately controlled on IV metoprolol or switching to IV Cardizem drip.  Cardiology was consulted and patient was placed on IV amiodarone drip.  Patient reverted to normal sinus rhythm.  Back on Eliquis since 3/12.  IV amiodarone discontinued and started prior home metoprolol and Cardizem on 3/9.  Follows with Dr. Antoine PocheHochrein, cardiology as outpatient.  Remains in sinus rhythm.  Discontinued telemetry 3/12  Hematochezia  Mild.  Resolved without recurrence.  Acute on stage II chronic kidney disease  Creatinine has steadily increased 1.21-1.29 >1.29 >1.37 >1.39.  Oral intake seems to be adequate.  Encouraged increased fluid intake ad lib.  Despite brief IV fluids yesterday, creatinine has plateaued.  Clinically euvolemic.  Encouraged ad lib. oral fluid intake.  Follow BMP in a.m. and if no worsening, outpatient follow-up.  Avoid nephrotoxic's.  Check bladder scan to make sure he does not have urinary retention-low suspicion.  Body mass index is 26.15 kg/m.  Nutritional Status Nutrition Problem: Increased nutrient needs Etiology: acute illness (pneumoperitoneum r/t sigmoid diverticulitis with perforation/fistulous tract) Signs/Symptoms: estimated needs Interventions: TPN,Prostat    DVT prophylaxis: SCDs Start: 03/20/20 0559.  On Eliquis.   Code Status: Full Code Family Communication: None at bedside today. Disposition:  Status is: Inpatient  Remains inpatient appropriate because:Inpatient level of care appropriate due to severity of illness   Dispo: The patient is from: Home              Anticipated d/c is to: Home likely 3/14.              Patient currently is not medically stable to d/c.   Difficult to place patient No        Consultants:   General surgery Cardiology  Procedures:   Sigmoid colectomy, colostomy and Hartman's pouch formation 03/23/2020 Right upper arm PICC line-to be removed prior to  discharge.  Antimicrobials:    Anti-infectives (From admission, onward)   Start     Dose/Rate Route Frequency Ordered Stop   03/23/20 1000  clindamycin (CLEOCIN) 900 mg, gentamicin (GARAMYCIN) 240 mg in sodium chloride 0.9 % 1,000 mL for intraperitoneal lavage  Status:  Discontinued         Irrigation To Surgery 03/23/20 0724 03/23/20 1705   03/23/20 0815  cefoTEtan (CEFOTAN) 2 g in sodium chloride 0.9 % 100 mL IVPB        2 g 200 mL/hr over 30 Minutes Intravenous On call to O.R. 03/23/20 0724 03/23/20 1332   03/20/20 1400  piperacillin-tazobactam (ZOSYN) IVPB 3.375 g  Status:  Discontinued        3.375 g 12.5 mL/hr over 240 Minutes Intravenous Every 8 hours 03/20/20 0623 03/20/20 0636   03/20/20 1200  cefTRIAXone (ROCEPHIN) 2 g in sodium chloride 0.9 % 100 mL IVPB        2 g 200 mL/hr over 30 Minutes Intravenous Every 24 hours 03/20/20 0636     03/20/20 1200  metroNIDAZOLE (FLAGYL) IVPB 500 mg        500 mg 100 mL/hr over 60 Minutes Intravenous Every 8 hours 03/20/20 0636     03/20/20 0530  piperacillin-tazobactam (ZOSYN) IVPB 3.375 g        3.375 g 100 mL/hr over 30 Minutes Intravenous  Once 03/20/20 0516 03/20/20 0609        Subjective:  No complaints.  Tolerating diet well.  Ambulating comfortably in the halls independently.  He and spouse have been educated to manage the ostomy.  No bleeding from JP drain.  Objective:   Vitals:   03/31/20 0605 03/31/20 1438 03/31/20 2017 04/01/20 0649  BP: 121/67 (!) 151/83 (!) 160/73 120/63  Pulse: 64 70 79 67  Resp: 15 20 19 14   Temp: 98.6 F (37 C) 98.6 F (37 C) 98.5 F (36.9 C) 98.1 F (36.7 C)  TempSrc: Oral Oral Oral Oral  SpO2: 94% 96% 97% 97%  Weight:      Height:        General exam: Middle-age male, moderately built and nourished, sitting up comfortably in reclining chair this morning. Respiratory system: Clear to auscultation.  No increased work of breathing. Cardiovascular system: S1 and S2 heard, RRR.  No JVD,  murmurs or pedal edema.  Off of telemetry now. Gastrointestinal system: Abdomen is nondistended, soft and nontender. No organomegaly or masses felt. Normal bowel sounds heard.  Midline surgical site dressing clean, dry and intact.  JP drain with some old blood but no active bleeding.  Hartman's pouch with liquid stools and gas. Central nervous system: Alert and oriented. No focal neurological deficits. Extremities: Symmetric 5 x 5 power.  Right upper arm PICC line without acute findings.  Mild redness, swelling without warmth or tenderness on medial aspect of left lower part of upper arm-improved. Skin: No rashes, lesions or ulcers Psychiatry: Judgement and insight appear normal. Mood & affect appropriate.     Data Reviewed:   I have personally reviewed following labs and imaging studies   CBC: Recent Labs  Lab 03/26/20 0807 03/27/20 0229 03/30/20 0335 03/31/20 0314 04/01/20 0500  WBC 11.4*   < > 12.0* 11.7* 8.8  NEUTROABS 9.1*  --   --   --   --   HGB 12.6*   < > 11.5* 11.7* 11.7*  HCT 38.6*   < > 35.8* 36.5* 35.5*  MCV 93.7   < > 94.7 94.8 93.7  PLT 211   < > 258 297 332   < > = values in this interval not displayed.    Basic Metabolic Panel: Recent Labs  Lab 03/28/20 0550 03/29/20 0442 03/30/20 0335 03/31/20 0314 04/01/20 0500  NA 137 133* 136 134* 135  K 3.5 3.9 4.8 4.4 4.5  CL 105 102 104 102 103  CO2 24 23 24 24 24   GLUCOSE 136* 165* 113* 154* 112*  BUN 23 24* 27* 28* 28*  CREATININE 1.21 1.29* 1.29* 1.37* 1.39*  CALCIUM 7.7* 7.7* 8.2* 8.3* 8.2*  MG 2.0 2.1 2.0  --   --   PHOS 4.2 4.5 4.2  --   --     Liver Function Tests: Recent Labs  Lab 03/26/20 0807 03/29/20 0442  AST 15 12*  ALT 15 18  ALKPHOS 50 55  BILITOT 0.7 0.6  PROT 5.3* 5.0*  ALBUMIN 2.5* 2.4*    CBG: Recent Labs  Lab 03/31/20 1716 04/01/20 0000 04/01/20 0611  GLUCAP 124* 128* 114*    Microbiology Studies:   No results found for this or any previous visit (from the past 240  hour(s)).   Radiology Studies:  No results found.   Scheduled Meds:   . (feeding supplement) PROSource Plus  30 mL Oral TID BM  . apixaban  5 mg Oral BID  . Chlorhexidine Gluconate Cloth  6 each Topical Daily  . cholecalciferol  1,000 Units Oral Daily  . diltiazem  240 mg Oral Daily  .  mouth rinse  15 mL Mouth Rinse BID  . metoprolol tartrate  25 mg Oral BID  . sodium chloride flush  10-40 mL Intracatheter Q12H  . tamsulosin  0.4 mg Oral Daily    Continuous Infusions:   . sodium chloride 1,000 mL (03/30/20 2341)  . cefTRIAXone (ROCEPHIN)  IV 2 g (03/31/20 1210)  . methocarbamol (ROBAXIN) IV 500 mg (04/01/20 0532)  . metronidazole 500 mg (04/01/20 0931)     LOS: 12 days     Marcellus Scott, MD, [place], [facility]. [facility]    To contact the attending provider between 7A-7P or the covering provider during after hours 7P-7A, please log into the web site [email or web address] and access using universal [place] password for that web site. If you do not have the password, please call the hospital operator.  04/01/2020, 10:20 AM

## 2020-04-02 DIAGNOSIS — K5720 Diverticulitis of large intestine with perforation and abscess without bleeding: Secondary | ICD-10-CM

## 2020-04-02 LAB — BASIC METABOLIC PANEL
Anion gap: 11 (ref 5–15)
BUN: 26 mg/dL — ABNORMAL HIGH (ref 8–23)
CO2: 21 mmol/L — ABNORMAL LOW (ref 22–32)
Calcium: 8.3 mg/dL — ABNORMAL LOW (ref 8.9–10.3)
Chloride: 103 mmol/L (ref 98–111)
Creatinine, Ser: 1.36 mg/dL — ABNORMAL HIGH (ref 0.61–1.24)
GFR, Estimated: 57 mL/min — ABNORMAL LOW (ref >60)
Glucose, Bld: 112 mg/dL — ABNORMAL HIGH (ref 70–99)
Potassium: 4.3 mmol/L (ref 3.5–5.1)
Sodium: 135 mmol/L (ref 135–145)

## 2020-04-02 LAB — CBC
HCT: 35.5 % — ABNORMAL LOW (ref 39.0–52.0)
Hemoglobin: 11.7 g/dL — ABNORMAL LOW (ref 13.0–17.0)
MCH: 31.1 pg (ref 26.0–34.0)
MCHC: 33 g/dL (ref 30.0–36.0)
MCV: 94.4 fL (ref 80.0–100.0)
Platelets: 353 10*3/uL (ref 150–400)
RBC: 3.76 MIL/uL — ABNORMAL LOW (ref 4.22–5.81)
RDW: 14.7 % (ref 11.5–15.5)
WBC: 8.4 10*3/uL (ref 4.0–10.5)
nRBC: 0 % (ref 0.0–0.2)

## 2020-04-02 MED ORDER — ACETAMINOPHEN 325 MG PO TABS: 650.0000 mg | ORAL_TABLET | Freq: Four times a day (QID) | ORAL | Status: DC | PRN

## 2020-04-02 MED ORDER — METHOCARBAMOL 500 MG PO TABS: 500.0000 mg | ORAL_TABLET | Freq: Four times a day (QID) | ORAL | 1 refills | Status: AC | PRN

## 2020-04-02 NOTE — Consult Note (Signed)
[place] Nurse ostomy follow up Wife performs pouch change today.  Stoma type/location: LLQ colostomy patient is independent with emptying at this time. Wife at bedside and would like to complete one more pouch change.  We empty the pouch first.   Stomal assessment/size: oval:  1.5 cm x 4 cm separation remains unchanged Peristomal assessment: separation noted Treatment options for stomal/peristomal skin: barrier ring and 1 piece convex pouch.  Output soft green stool Ostomy pouching: 1pc.convex with barrier ring Education provided: Wife performs pouch change.  We discuss oval stoma and pattern she is to use.  If stoma changes shape drastically, she will need to make a new one.  [place] nurse to continue teaching at home.  New pouch applied and patient and wife feel confident in process.  I have provided 4 additional pouch sets and rings.  They have those now.   Enrolled patient in [place] Start Discharge program: Yes kit has arrived.  Will not follow at this time.  Please re-consult if needed.  Domenic Moras MSN, RN, FNP-BC CWON Wound, Ostomy, Continence Nurse Pager [phone]

## 2020-04-02 NOTE — Plan of Care (Signed)
  Problem: Clinical Measurements: Goal: Will remain free from infection Outcome: Progressing   Problem: Skin Integrity: Goal: Risk for impaired skin integrity will decrease Outcome: Progressing   Problem: Elimination: Goal: Will not experience complications related to bowel motility Outcome: Progressing

## 2020-04-02 NOTE — Discharge Summary (Signed)
Physician Discharge Summary  Daniel Aguirre:[id number] DOB: 1950/05/27  PCP: Daisy Floro, MD  Admitted from: Home Discharged to: Home  Admit date: 03/19/2020 Discharge date: 04/02/2020  Recommendations for Outpatient Follow-up:    Follow-up Information    Harriette Bouillon, MD. Go on 04/24/2020.   Specialty: General Surgery Why: 10:30 AM. Please arrive 30 min prior to appointment time. Bring photo ID and insurance information with you.  Contact information: [address] [place] [address] [phone]        Daisy Floro, MD. Schedule an appointment as soon as possible for a visit in 1 week(s).   Specialty: Family Medicine Why: To be seen with repeat labs (CBC & BMP). Contact information: [address] [place] [address] [phone]        Rollene Rotunda, MD. Schedule an appointment as soon as possible for a visit in 1 week(s).   Specialty: Cardiology Contact information: 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 Active Problems:   Atrial fibrillation (HCC)   Essential hypertension   HLD (hyperlipidemia)   Chronic kidney disease with active medical management without dialysis, stage 3 (moderate) (HCC)   Nonrheumatic aortic valve insufficiency   Intra-abdominal abscess (HCC)   BPH (benign prostatic hyperplasia)   Daytime somnolence   Testicular hypofunction   Tinnitus   Vitamin D deficiency   Colostomy in place [facility])   Brief Summary: 70 year old  male with medical history significant for but not limited to hypertension, CKD stage II, atrial fibrillation on Eliquis anticoagulation, recent hospitalization 01/22/2020-01/29/2020 for diverticulitis with perforation and abscess, not amenable to IR drainage secondary to location, general surgery and ID had consulted, managed conservatively and discharged home on 4 weeks of oral Augmentin, had improved but then developed progressive left lower quadrant abdominal pain that started a week prior to admission.  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  Hospital course complicated by A. fib with RVR and uncontrolled hypertension.   Assessment & Plan:   Sigmoid diverticulitis with perforation/fistulous tract:  History as noted above with recent episode of diverticulitis with perforation and abscess in early January that was not amenable to IR drainage and was treated with IV followed by oral antibiotics.  CT abdomen and pelvis on admission showed pneumoperitoneum.  General surgery was consulted.  Plan was to initially treat with IV antibiotics and monitor.  Patient however did not progress well and continued to have abdominal pain and tenderness.  Now s/p sigmoid colectomy, colostomy and Hartman's pouch on 03/23/2020.  Postop ileus resolved.  Tolerating diet.  TPN discontinued.  Patient had significant frank blood in JP drain around drain site on 3/10.  After discussing with general surgery, IV heparin was briefly held, bleeding stopped, IV heparin resumed, no further ongoing active bleeding and hemoglobin stable.  Heparin changed back to home Eliquis on 3/12 without recurrence of bleeding.  Overall doing well.  Tolerating diet.  Ambulating.  Having stools through ostomy.  Patient and spouse have been educated and able to manage it by themselves.  Discussed with  CCS team, Trixie Deis, PA-C and Dr. Michaell Cowing.  JP drain removed this morning.  Antibiotics discontinued.  They have cleared patient for discharge and have arranged outpatient follow-up.  Hypertensive urgency/essential hypertension  SBP's on 3/7 were in the 200s, not controlled on clonidine patch, as needed IV hydralazine and scheduled IV metoprolol.  Thereby he was started on IV nicardipine drip.  Also received IV Lasix 40 mg every 12 hours x3 doses for fluid overload.  IV nicardipine drip and clonidine patch discontinued 3/9.    Continue prior home dose of metoprolol 25 mg twice daily and diltiazem CD 240 mg daily.  Reasonable control with occasional fluctuations  Anasarca  Multifactorial due to volume resuscitation during acute illness earlier on, hypoalbuminemia and poor nutritional status.  S/p IV Lasix 40 mg every 12 hourly x3 doses couple of days back.  Resolved and appears clinically euvolemic at time of discharge.  Left upper extremity swelling  Likely related to anasarca and basilic vein SVT noted on venous Doppler.  Resolved  A. fib with RVR  Developed A. fib with RVR earlier in hospitalization.  This was not adequately controlled on IV metoprolol or switching to IV Cardizem drip.  Cardiology was consulted and patient was placed on IV amiodarone drip.  Patient reverted to normal sinus rhythm.  Back on Eliquis since 3/12.  IV amiodarone discontinued and started prior home metoprolol and Cardizem on 3/9.  Follows with Dr. Antoine Poche, Cardiology as outpatient.  Remains in sinus rhythm.  Discontinued telemetry 3/12  Hematochezia  Mild.  Resolved without recurrence.  Acute on stage II chronic kidney disease  Creatinine has steadily increased 1.21-1.29 >1.29 >1.37 >1.39.  Oral intake seems to be adequate.   Despite brief IV fluids yesterday, creatinine has plateaued.  Clinically euvolemic.  Encouraged ad lib. oral fluid intake.    Avoid nephrotoxic's and  patient counseled to avoid all NSAIDs.  Checked bladder scan yesterday, no urinary retention.  Creatinine plateaued at 1.3 over the last 3 days.  As per surgical team, elevated creatinine can occur with IV Robaxin.  Follow BMP closely as outpatient  Body mass index is 26.15 kg/m.  Nutritional Status Nutrition Problem: Increased nutrient needs Etiology: acute illness (pneumoperitoneum r/t sigmoid diverticulitis with perforation/fistulous tract) Signs/Symptoms: estimated needs Interventions: TPN,Prostat       Consultants:   General surgery Cardiology  Procedures:   Sigmoid colectomy, colostomy and Hartman's pouch formation 03/23/2020 Right upper arm PICC line-removed PTA.  Antimicrobials:     Discharge Instructions  Discharge Instructions    Call MD for:  difficulty breathing, headache or visual disturbances   Complete by: As directed    Call MD for:  extreme fatigue   Complete by: As directed    Call MD for:  persistant dizziness or light-headedness   Complete by: As directed    Call MD for:  persistant nausea and vomiting   Complete by: As directed    Call MD for:  redness, tenderness, or signs of infection (pain, swelling, redness, odor or green/yellow discharge around incision site)   Complete by: As directed    Call MD for:  severe uncontrolled pain   Complete by: As directed    Call MD for:  temperature >100.4   Complete  by: As directed    Diet - low sodium heart healthy   Complete by: As directed    Discharge wound care:   Complete by: As directed    Kindly follow instructions that were provided by the surgeons.   Increase activity slowly   Complete by: As directed        Medication List    STOP taking these medications   acetaminophen 650 MG CR tablet Commonly known as: TYLENOL Replaced by: acetaminophen 325 MG tablet   amoxicillin-clavulanate 875-125 MG tablet Commonly known as: AUGMENTIN   predniSONE 50 MG tablet Commonly known as:  DELTASONE     TAKE these medications   acetaminophen 325 MG tablet Commonly known as: TYLENOL Take 2 tablets (650 mg total) by mouth every 6 (six) hours as needed for mild pain or fever. Replaces: acetaminophen 650 MG CR tablet   diltiazem 240 MG 24 hr capsule Commonly known as: CARDIZEM CD Take 1 capsule (240 mg total) by mouth daily.   Eliquis 5 MG Tabs tablet Generic drug: apixaban TAKE 1 TABLET BY MOUTH TWICE A DAY What changed: how much to take   EPINEPHrine 0.3 mg/0.3 mL Soaj injection Commonly known as: EPI-PEN Inject 0.3 mLs into the muscle once as needed for anaphylaxis.   fluticasone 0.005 % ointment Commonly known as: CUTIVATE Apply 1 application topically 2 (two) times daily as needed (rash/dry skin).   methocarbamol 500 MG tablet Commonly known as: Robaxin Take 1 tablet (500 mg total) by mouth every 6 (six) hours as needed for muscle spasms (pain).   metoprolol tartrate 25 MG tablet Commonly known as: LOPRESSOR Take 25 mg by mouth 2 (two) times daily.   psyllium 58.6 % powder Commonly known as: METAMUCIL Take 2 packets by mouth daily. 2 tablespoons daily   tamsulosin 0.4 MG Caps capsule Commonly known as: FLOMAX Take 0.4 mg by mouth daily.   testosterone 50 MG/5GM (1%) Gel Commonly known as: ANDROGEL Place 2.5-5 g onto the skin daily.  on Monday Wednesday Friday 2.5mg  on Tuesday thursdsay Saturday sunday   Vitamin D3 125 MCG (5000 UT) Caps Take 5,000 Units by mouth daily.      Allergies  Allergen Reactions  . Amlodipine Palpitations  . Augmentin [Amoxicillin-Pot Clavulanate] Other (See Comments)    Rash developed in 2022- Patient initially tolerated ~ 4 weeks of therapy. When he re-started augmentin at a later date, he developed a rash about 1 week into therapy. No shortness of breath or swelling.  . Lisinopril Other (See Comments)    Elevated  creatinine       Procedures/Studies: CT ABDOMEN PELVIS W CONTRAST  Result Date:  03/20/2020 CLINICAL DATA:  Abdominal pain EXAM: CT ABDOMEN AND PELVIS WITH CONTRAST TECHNIQUE: Multidetector CT imaging of the abdomen and pelvis was performed using the standard protocol following bolus administration of intravenous contrast. CONTRAST:  80mL OMNIPAQUE IOHEXOL 300 MG/ML  SOLN COMPARISON:  February 20, 2020 FINDINGS: Lower chest: The lung bases are clear. The heart size is normal. Hepatobiliary: The liver is normal. Status post cholecystectomy.There is no biliary ductal dilation. Pancreas: Normal contours without ductal dilatation. No peripancreatic fluid collection. Spleen: Unremarkable. Adrenals/Urinary Tract: --Adrenal glands: Unremarkable. --Right kidney/ureter: No hydronephrosis or radiopaque kidney stones. --Left kidney/ureter: No hydronephrosis or radiopaque kidney stones. --Urinary bladder: Unremarkable. Stomach/Bowel: --Stomach/Duodenum: No hiatal hernia or other gastric abnormality. Normal duodenal course and caliber. --Small bowel: There is diffuse circumferential wall thickening of multiple small bowel loops in the low midline abdomen. There are adjacent  pockets of gas and free fluid. There is associated wall thickening of the nearby small bowel. There is adjacent fat stranding. --Colon: Again noted are colonic diverticula following the sigmoid colon. There is an apparent fistulous tract coursing superiorly from the sigmoid colon towards pockets of gas and fluid in the mid abdomen. --Appendix: Normal. Vascular/Lymphatic: Normal course and caliber of the major abdominal vessels. --No retroperitoneal lymphadenopathy. --No mesenteric lymphadenopathy. --No pelvic or inguinal lymphadenopathy. Reproductive: Unremarkable Other: There is new free air scattered throughout the abdomen. The abdominal wall is normal. Musculoskeletal. No acute displaced fractures. IMPRESSION: New pneumoperitoneum. This is felt to be secondary to the previously demonstrated sigmoid diverticulitis. However, there are  loops of small bowel in the low midline abdomen that demonstrate wall thickening and hyperenhancement. While these are felt to be reactive, they may also be the source of free air. These results will be called to the ordering clinician or representative by the Radiologist Assistant, and communication documented in the PACS or [employer]. Electronically Signed   By: Katherine Mantle M.D.   On: 03/20/2020 03:53   VAS [place] UPPER EXTREMITY VENOUS DUPLEX  Result Date: 03/26/2020 UPPER VENOUS STUDY  Indications: Edema, and Erythema Limitations: Unable to position arm correctly as patient refused to move from recliner to bed. Comparison Study: No prior study Performing Technologist: Sherren Kerns RVS  Examination Guidelines: A complete evaluation includes B-mode imaging, spectral Doppler, color Doppler, and power Doppler as needed of all accessible portions of each vessel. Bilateral testing is considered an integral part of a complete examination. Limited examinations for reoccurring indications may be performed as noted.  Right Findings: +----------+------------+---------+-----------+----------+-------+ RIGHT     CompressiblePhasicitySpontaneousPropertiesSummary +----------+------------+---------+-----------+----------+-------+ Subclavian               Yes       Yes                      +----------+------------+---------+-----------+----------+-------+  Left Findings: +----------+------------+---------+-----------+----------+-------+ LEFT      CompressiblePhasicitySpontaneousPropertiesSummary +----------+------------+---------+-----------+----------+-------+ IJV           Full       Yes       Yes                      +----------+------------+---------+-----------+----------+-------+ Subclavian    Full       Yes       Yes                      +----------+------------+---------+-----------+----------+-------+ Axillary      Full       Yes       Yes                       +----------+------------+---------+-----------+----------+-------+ Brachial      Full       Yes       Yes                      +----------+------------+---------+-----------+----------+-------+ Radial        Full                                          +----------+------------+---------+-----------+----------+-------+ Ulnar         Full                                          +----------+------------+---------+-----------+----------+-------+  Cephalic      Full                                          +----------+------------+---------+-----------+----------+-------+ Basilic       None                                   Acute  +----------+------------+---------+-----------+----------+-------+  Summary:  Right: No evidence of thrombosis in the subclavian.  Left: No evidence of deep vein thrombosis in the upper extremity. Findings consistent with acute superficial vein thrombosis involving the left basilic vein. Basilic thrombus noted from proximal forearm to just above AC.  *See table(s) above for measurements and observations.  Diagnosing physician: Fabienne Brunsharles Fields MD Electronically signed by Fabienne Brunsharles Fields MD on 03/26/2020 at 9:16:16 PM.    Final    US EKG SITE RITE  Result Date: 03/23/2020 If Site Rite image not attached, placement could not be confirmed due to current cardiac rhythm.    Subjective: Seen this morning shortly after the surgical team had evaluated him.  JP drain had been removed.  Denies complaints.  Anxious to get home.  Discharge Exam:  Vitals:   04/01/20 2101 04/02/20 0500 04/02/20 0504 04/02/20 0950  BP: (!) 153/81  (!) 142/76 140/73  Pulse: 85  75 78  Resp: 20  15   Temp: 98.3 F (36.8 C)  98.5 F (36.9 C)   TempSrc:      SpO2: 97%  96%   Weight:  76.2 kg    Height:        General exam: Middle-age male, moderately built and nourished, sitting up comfortably in reclining chair this morning. Respiratory system: Clear to auscultation.  No  increased work of breathing. Cardiovascular system: S1 and S2 heard, RRR.  No JVD, murmurs or pedal edema.  Off of telemetry now. Gastrointestinal system: Abdomen is nondistended, soft and nontender. No organomegaly or masses felt. Normal bowel sounds heard.  Midline surgical site dressing clean, dry and intact.  JP drain now out.  Hartman's pouch with liquid stools and gas. Central nervous system: Alert and oriented. No focal neurological deficits. Extremities: Symmetric 5 x 5 power.  Right upper arm PICC line without acute findings.    Left upper arm swelling resolved. Skin: No rashes, lesions or ulcers Psychiatry: Judgement and insight appear normal. Mood & affect appropriate.     The results of significant diagnostics from this hospitalization (including imaging, microbiology, ancillary and laboratory) are listed below for reference.     Microbiology: No results found for this or any previous visit (from the past 240 hour(s)).   Labs: CBC: Recent Labs  Lab 03/29/20 1502 03/30/20 0335 03/31/20 0314 04/01/20 0500 04/02/20 0633  WBC 13.7* 12.0* 11.7* 8.8 8.4  HGB 12.3* 11.5* 11.7* 11.7* 11.7*  HCT 37.4* 35.8* 36.5* 35.5* 35.5*  MCV 93.3 94.7 94.8 93.7 94.4  PLT 288 258 297 332 353    Basic Metabolic Panel: Recent Labs  Lab 03/27/20 0229 03/27/20 1245 03/28/20 0550 03/29/20 0442 03/30/20 0335 03/31/20 0314 04/01/20 0500 04/02/20 0633  NA 136  --  137 133* 136 134* 135 135  K 3.6   < > 3.5 3.9 4.8 4.4 4.5 4.3  CL 107  --  105 102 104 102 103 103  CO2 23  --  24 23  24 24 24  21*  GLUCOSE 161*  --  136* 165* 113* 154* 112* 112*  BUN 21  --  23 24* 27* 28* 28* 26*  CREATININE 1.12  --  1.21 1.29* 1.29* 1.37* 1.39* 1.36*  CALCIUM 7.6*  --  7.7* 7.7* 8.2* 8.3* 8.2* 8.3*  MG 2.1  --  2.0 2.1 2.0  --   --   --   PHOS 3.8  --  4.2 4.5 4.2  --   --   --    < > = values in this interval not displayed.    Liver Function Tests: Recent Labs  Lab 03/29/20 0442  AST 12*   ALT 18  ALKPHOS 55  BILITOT 0.6  PROT 5.0*  ALBUMIN 2.4*    CBG: Recent Labs  Lab 03/31/20 0621 03/31/20 1143 03/31/20 1716 04/01/20 0000 04/01/20 0611  GLUCAP 103* 111* 124* 128* 114*    Urinalysis    Component Value Date/Time   COLORURINE YELLOW 03/20/2020 0330   APPEARANCEUR CLEAR 03/20/2020 0330   LABSPEC 1.035 (H) 03/20/2020 0330   PHURINE 5.0 03/20/2020 0330   GLUCOSEU NEGATIVE 03/20/2020 0330   HGBUR NEGATIVE 03/20/2020 0330   BILIRUBINUR NEGATIVE 03/20/2020 0330   KETONESUR NEGATIVE 03/20/2020 0330   PROTEINUR NEGATIVE 03/20/2020 0330   NITRITE NEGATIVE 03/20/2020 0330   LEUKOCYTESUR NEGATIVE 03/20/2020 0330      Time coordinating discharge: 40 minutes  SIGNED:  05/20/2020, MD, FACP, [facility]. [facility]  To contact the attending provider between 7A-7P or the covering provider during after hours 7P-7A, please log into the web site [email or web address] and access using universal [place] password for that web site. If you do not have the password, please call the hospital operator.

## 2020-04-02 NOTE — TOC Progression Note (Signed)
Transition of Care [facility]) - Progression Note    Patient Details  Name: Daniel Aguirre MRN: [id number] Date of Birth: February 02, 1950  [facility] CM/SW Contact  Geni Bers, RN Phone Number: 04/02/2020, 3:25 PM  Clinical Narrative:    Tried several home health agencies for [facility] for this pt. [facility], [facility], [facility], [facility], [facility], no one is in network and could not offer home health services. Explained this to pt's wife. Encouraged her to call the telephone number on pt's insurance card for assistance. Wife did not have insurance card with her.    Expected Discharge Plan: Home/Self Care Barriers to Discharge: Continued Medical Work up  Expected Discharge Plan and Services Expected Discharge Plan: Home/Self Care   Discharge Planning Services: CM Consult   Living arrangements for the past 2 months: Single Family Home Expected Discharge Date: 04/02/20                                     Social Determinants of Health (SDOH) Interventions    Readmission Risk Interventions No flowsheet data found.

## 2020-04-02 NOTE — Progress Notes (Signed)
Progress Note  10 Days Post-Op  Subjective: Patient denies abdominal pain this AM. He is tolerating soft diet and denies nausea. Having colostomy output and has been emptying on his own. His wife is planning on coming in today for a little more teaching on colostomy care prior to discharge.   Objective: Vital signs in last 24 hours: Temp:  [97.8 F (36.6 C)-98.5 F (36.9 C)] 98.5 F (36.9 C) (03/14 0504) Pulse Rate:  [75-85] 75 (03/14 0504) Resp:  [15-20] 15 (03/14 0504) BP: (120-153)/(76-81) 142/76 (03/14 0504) SpO2:  [96 %-99 %] 96 % (03/14 0504) Weight:  [76.2 kg] 76.2 kg (03/14 0500) Last BM Date: 04/02/20  Intake/Output from previous day: 03/13 0701 - 03/14 0700 In: 2812.7 [P.O.:1400; I.V.:684.7; IV Piggyback:728] Out: 3270 [Urine:2700; Drains:20; Stool:550] Intake/Output this shift: No intake/output data recorded.  PE: General: pleasant, WD, WN male who is sitting in chair in NAD Heart: regular, rate, and rhythm.  Lungs: CTAB, no wheezes, rhonchi, or rales noted.  Respiratory effort nonlabored Abd: soft, NT, ND, +BS, JP drain removed and dry dressing applied, midline wound clean with healthy granulation tissue present, colostomy functioning with brown stool in appliance MS: all 4 extremities are symmetrical with no cyanosis, clubbing, or edema. Skin: warm and dry with no masses, lesions, or rashes Neuro: Cranial nerves 2-12 grossly intact, sensation is normal throughout Psych: A&Ox3 with an appropriate affect.    Lab Results:  Recent Labs    04/01/20 0500 04/02/20 0633  WBC 8.8 8.4  HGB 11.7* 11.7*  HCT 35.5* 35.5*  PLT 332 353   BMET Recent Labs    04/01/20 0500 04/02/20 0633  NA 135 135  K 4.5 4.3  CL 103 103  CO2 24 21*  GLUCOSE 112* 112*  BUN 28* 26*  CREATININE 1.39* 1.36*  CALCIUM 8.2* 8.3*   PT/INR No results for input(s): LABPROT, INR in the last 72 hours. CMP     Component Value Date/Time   NA 135 04/02/2020 0633   K 4.3 04/02/2020  0633   CL 103 04/02/2020 0633   CO2 21 (L) 04/02/2020 0633   GLUCOSE 112 (H) 04/02/2020 0633   BUN 26 (H) 04/02/2020 0633   CREATININE 1.36 (H) 04/02/2020 0633   CALCIUM 8.3 (L) 04/02/2020 0633   PROT 5.0 (L) 03/29/2020 0442   ALBUMIN 2.4 (L) 03/29/2020 0442   AST 12 (L) 03/29/2020 0442   ALT 18 03/29/2020 0442   ALKPHOS 55 03/29/2020 0442   BILITOT 0.6 03/29/2020 0442   GFRNONAA 57 (L) 04/02/2020 0633   GFRAA 39 (L) 09/24/2019 2327   Lipase     Component Value Date/Time   LIPASE 37 03/20/2020 0014       Studies/Results: No results found.  Anti-infectives: Anti-infectives (From admission, onward)   Start     Dose/Rate Route Frequency Ordered Stop   03/23/20 1000  clindamycin (CLEOCIN) 900 mg, gentamicin (GARAMYCIN) 240 mg in sodium chloride 0.9 % 1,000 mL for intraperitoneal lavage  Status:  Discontinued         Irrigation To Surgery 03/23/20 0724 03/23/20 1705   03/23/20 0815  cefoTEtan (CEFOTAN) 2 g in sodium chloride 0.9 % 100 mL IVPB        2 g 200 mL/hr over 30 Minutes Intravenous On call to O.R. 03/23/20 0724 03/23/20 1332   03/20/20 1400  piperacillin-tazobactam (ZOSYN) IVPB 3.375 g  Status:  Discontinued        3.375 g 12.5 mL/hr over 240 Minutes Intravenous Every  8 hours 03/20/20 0623 03/20/20 0636   03/20/20 1200  cefTRIAXone (ROCEPHIN) 2 g in sodium chloride 0.9 % 100 mL IVPB        2 g 200 mL/hr over 30 Minutes Intravenous Every 24 hours 03/20/20 0636     03/20/20 1200  metroNIDAZOLE (FLAGYL) IVPB 500 mg        500 mg 100 mL/hr over 60 Minutes Intravenous Every 8 hours 03/20/20 0636     03/20/20 0530  piperacillin-tazobactam (ZOSYN) IVPB 3.375 g        3.375 g 100 mL/hr over 30 Minutes Intravenous  Once 03/20/20 0516 03/20/20 0609       Assessment/Plan Atrial fibrillation-back on home meds Hypertension - back on home meds Mild renal insufficiency - Cr stable at 1.3 Hx C diff colitis  Complex Hinchey 3 diverticulitis with interloop abscess,  pelvic abscess, and small bowel obstruction Sigmoid colectomy and colostomy, Hartman's pouch formationDr. Cornett on 03/23/20 POD #10 - tolerating soft diet and having bowel function - JP drain removed - ok to stop abx at this point - reviewed discharge instructions and follow up plans  FEN: soft diet VTE: eliquis ID: no abx needed on discharge, has completed 10 days rocephin/flagyl  Stable for discharge from a surgical perspective. Will arrange follow up and provide instructions for wound care and post-op care.   LOS: 13 days    Juliet Rude , [facility] 04/02/2020, 8:29 AM Please see Amion for pager number during day hours 7:00am-4:30pm

## 2020-04-02 NOTE — TOC Progression Note (Addendum)
Transition of Care [facility]) - Progression Note    Patient Details  Name: Daniel Aguirre MRN: [id number] Date of Birth: September 05, 1950  Transition of Care [facility]) CM/SW Contact  Geni Bers, RN Phone Number: 04/02/2020, 4:15 PM  Clinical Narrative:    Kathlene November with [employer] called to stated that agency will follow pt at home with [facility]. Wife is aware.   Expected Discharge Plan: Home/Self Care Barriers to Discharge: Continued Medical Work up  Expected Discharge Plan and Services Expected Discharge Plan: Home/Self Care   Discharge Planning Services: CM Consult   Living arrangements for the past 2 months: Single Family Home Expected Discharge Date: 04/02/20                                     Social Determinants of Health (SDOH) Interventions    Readmission Risk Interventions No flowsheet data found.

## 2020-04-02 NOTE — Progress Notes (Addendum)
Patient discharged home with wife, discharge instructions given and explained to patient wife and the verbalized understanding, wife and patient have been educated and demonstrated  surgical wound care and ostomy care and supplies given to patient for home, case manager unable to set-up home health due to patient's insurance, case manager informed patient/wife.; incisions clean/dry/intact, no pressure injury noted. Patient accompanied home by wife.

## 2020-04-12 NOTE — Progress Notes (Unsigned)
Cardiology Office Note   Date:  04/13/2020   ID:  Daniel Aguirre, DOB 08-24-1950, MRN [id number]  PCP:  Daisy Floro, MD  Cardiologist:   Rollene Rotunda, MD   Chief Complaint  Patient presents with  . Atrial Fibrillation      History of Present Illness: Daniel Aguirre is a 70 y.o. male who presents for evaluation of atrial fib.  He had this initially in 2019.  He was hospitalized at that time.  He had some elevated troponin but a follow-up negative perfusion study.  He did have some probably moderate aortic insufficiency with an eccentric jet.  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  He has some renal insufficiency.  Since I last saw him he has been hospitalized couple of times.  I reviewed these records for this visit.  He was hospitalized in January for diverticulitis with perforation and abscess.  He was managed conservatively and discharged home with antibiotics.  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  His hospital course was complicated by A. fib with rapid ventricular rate.  He did have some frank blood from his JP drain at one point and required discontinuation of heparin but subsequently was sent home on Eliquis.  He did have hypertensive urgency while he was there with systolic blood pressures in the 200s.  He was treated with nicardipine and IV Lasix.  He was sent home on twice daily metoprolol and Cardizem.  He had anasarca.  He was diuresed.  He had some renal insufficiency with a max creatinine of 1.39.  Of note he did appear to be in sinus tachycardia on his last EKG.  He has had no atrial fibrillation since then.  He would be able to feel that he thinks.  He also checks with his home device. The patient denies any new symptoms such as chest discomfort, neck or arm discomfort. There has been no new  shortness of breath, PND or orthopnea. There have been no reported palpitations, presyncope or syncope.    He does have his wound which is packed and healing.  He has a colostomy.  He denies any other cardiovascular symptoms.   Past Medical History:  Diagnosis Date  . Chronic kidney disease with active medical management without dialysis, stage 3 (moderate) (HCC) 12/20/2017  . CKD (chronic kidney disease), stage III (HCC)   . Diastolic dysfunction    a. 09/2015 Echo: EF 65-70%, no rwma, Gr1 DD, Mod AI. Asc AO 55mm. Mildly dil Asc Ao.  LA 39mm.  . Diverticulitis with perforation and abscess s/p Hartmann sigmoid colectomy/colostomy 03/23/2020 01/23/2020  . Dupuytren contracture 06/09/2018  . Dyslipidemia   . Hypertension   . Kidney stones   . Moderate aortic insufficiency    a. 09/2015 Echo: Mod AI.  Daniel Aguirre Right bundle branch block   . Testicular hypofunction     Past Surgical History:  Procedure Laterality Date  . CHOLECYSTECTOMY    . COLOSTOMY N/A 03/23/2020   Procedure: COLOSTOMY;  Surgeon: Harriette Bouillon, MD;  Location: [facility];  Service: General;  Laterality: N/A;  . LAPAROTOMY N/A 03/23/2020   Procedure: EXPLORATORY LAPAROTOMY;  Surgeon: Harriette Bouillon, MD;  Location: [facility];  Service: General;  Laterality: N/A;  . MOHS SURGERY    . PARTIAL COLECTOMY N/A 03/23/2020   Procedure: PARTIAL COLECTOMY;  Surgeon: Harriette Bouillon, MD;  Location: [facility];  Service: General;  Laterality: N/A;     Current Outpatient Medications  Medication Sig Dispense Refill  . acetaminophen (TYLENOL) 325 MG tablet Take 2 tablets (650 mg total) by mouth every 6 (six) hours as needed for mild pain or fever.    . Cholecalciferol (VITAMIN D3) 125 MCG (5000 UT) CAPS Take 5,000 Units by mouth daily.    Daniel Aguirre diltiazem (CARDIZEM CD) 240 MG 24 hr capsule Take 1 capsule (240 mg total) by mouth daily. 90 capsule 0  . ELIQUIS 5 MG TABS tablet TAKE 1 TABLET BY MOUTH TWICE A DAY (Patient taking differently: Take 5 mg by mouth 2 (two)  times daily.) 60 tablet 2  . EPINEPHrine 0.3 mg/0.3 mL IJ SOAJ injection Inject 0.3 mLs into the muscle once as needed for anaphylaxis.    . fluticasone (CUTIVATE) 0.005 % ointment Apply 1 application topically 2 (two) times daily as needed (rash/dry skin).    . methocarbamol (ROBAXIN) 500 MG tablet Take 1 tablet (500 mg total) by mouth every 6 (six) hours as needed for muscle spasms (pain). 60 tablet 1  . metoprolol tartrate (LOPRESSOR) 25 MG tablet Take 25 mg by mouth 2 (two) times daily.    . psyllium (METAMUCIL) 58.6 % powder Take 2 packets by mouth daily. 2 tablespoons daily    . tamsulosin (FLOMAX) 0.4 MG CAPS capsule Take 0.4 mg by mouth daily.    Daniel Aguirre testosterone (ANDROGEL) 50 MG/5GM (1%) GEL Place 2.5-5 g onto the skin daily. 5mg  on Monday Wednesday Friday 2.5mg  on Tuesday thursdsay Saturday sunday     No current facility-administered medications for this visit.    Allergies:   Amlodipine, Augmentin [amoxicillin-pot clavulanate], and Lisinopril    ROS:  Please see the history of present illness.   Otherwise, review of systems are positive for none.   All other systems are reviewed and negative.    PHYSICAL EXAM: VS:  BP 118/62   Pulse 75   Ht 5\' 8"  (1.727 m)   Wt 166 lb (75.3 kg)   SpO2 98%   BMI 25.24 kg/m  , BMI Body mass index is 25.24 kg/m.  GENERAL:  Well appearing NECK:  No jugular venous distention, waveform within normal limits, carotid upstroke brisk and symmetric, no bruits, no thyromegaly LUNGS:  Clear to auscultation bilaterally CHEST:  Unremarkable HEART:  PMI not displaced or sustained,S1 and S2 within normal limits, no S3, no S4, no clicks, no rubs, 2 out of 6 brief apical systolic murmur nonradiating, soft diastolic murmurs ABD:  Flat, positive bowel sounds normal in frequency in pitch, no bruits, no rebound, no guarding, no midline pulsatile mass, no hepatomegaly, no splenomegaly, left lower colostomy EXT:  2 plus pulses throughout, no edema, no cyanosis no  clubbing   EKG:  EKG is not ordered today.   Recent Labs: 03/29/2020: ALT 18 03/30/2020: Magnesium 2.0 04/02/2020: BUN 26; Creatinine, Ser 1.36; Hemoglobin 11.7; Platelets 353; Potassium 4.3; Sodium 135    Lipid Panel    Component Value Date/Time   TRIG 83 03/26/2020 0807      Wt Readings from Last 3 Encounters:  04/13/20 166 lb (75.3 kg)  04/02/20 167 lb 15.9 oz (76.2 kg)  02/17/20 181 lb (82.1 kg)      Other studies Reviewed: Additional studies/ records that were reviewed today include: Extensive review of hospital records Review of the above records demonstrates:  Please see elsewhere in the note.     ASSESSMENT AND PLAN:  ATRIAL FIB:Mr.04/04/20 Schumakerhas a CHA2DS2 -  VASc score of 3.  He has not had any further paroxysms.  He is tolerating anticoagulation.  No change in therapy.   HTN:  His blood pressure is now at target.  He will continue the meds as listed.   STAGE III CKD:  Creatinine was 1.36 which is down from peak.  No change in therapy.    AI:He has moderate aortic insufficiency on echo in Sept 2021.  I will follow this up with an echocardiogram probably next year when I see him.   Current medicines are reviewed at length with the patient today.  The patient does not have concerns regarding medicines.  The following changes have been made:  None  Labs/ tests ordered today include:   No orders of the defined types were placed in this encounter.    Disposition:   FU with me in 12 months.     Signed, Rollene Rotunda, MD  04/13/2020 3:01 PM    [facility]

## 2020-04-13 ENCOUNTER — Other Ambulatory Visit: Payer: Self-pay

## 2020-04-13 ENCOUNTER — Ambulatory Visit (INDEPENDENT_AMBULATORY_CARE_PROVIDER_SITE_OTHER): Payer: PRIVATE HEALTH INSURANCE | Admitting: Cardiology

## 2020-04-13 ENCOUNTER — Encounter: Payer: Self-pay | Admitting: Cardiology

## 2020-04-13 VITALS — BP 118/62 | HR 75 | Ht 68.0 in | Wt 166.0 lb

## 2020-04-13 DIAGNOSIS — N1831 Chronic kidney disease, stage 3a: Secondary | ICD-10-CM

## 2020-04-13 DIAGNOSIS — I10 Essential (primary) hypertension: Secondary | ICD-10-CM

## 2020-04-13 DIAGNOSIS — I480 Paroxysmal atrial fibrillation: Secondary | ICD-10-CM | POA: Diagnosis not present

## 2020-04-13 NOTE — Patient Instructions (Signed)
Medication Instructions:  Your physician recommends that you continue on your current medications as directed. Please refer to the Current Medication list given to you today.  *If you need a refill on your cardiac medications before your next appointment, please call your pharmacy*  Lab Work: NONE ordered at this time of appointment   If you have labs (blood work) drawn today and your tests are completely normal, you will receive your results only by: MyChart Message (if you have MyChart) OR A paper copy in the mail If you have any lab test that is abnormal or we need to change your treatment, we will call you to review the results.  Testing/Procedures: NONE ordered at this time of appointment   Follow-Up: At [facility], you and your health needs are our priority.  As part of our continuing mission to provide you with exceptional heart care, we have created designated Provider Care Teams.  These Care Teams include your primary Cardiologist (physician) and Advanced Practice Providers (APPs -  Physician Assistants and Nurse Practitioners) who all work together to provide you with the care you need, when you need it.  Your next appointment:   1 year(s)  The format for your next appointment:   In Person  Provider:   James Hochrein, MD  Other Instructions   

## 2020-04-27 ENCOUNTER — Telehealth: Payer: Self-pay

## 2020-04-27 NOTE — Telephone Encounter (Signed)
Clinical pharmacist to review Eliquis 

## 2020-04-27 NOTE — Telephone Encounter (Signed)
   [facility] Pre-operative Risk Assessment    Patient Name: Daniel Aguirre  DOB: December 11, 1950  MRN: [id number]   HEARTCARE STAFF: - Please ensure there is not already an duplicate clearance open for this procedure. - Under Visit Info/Reason for Call, type in Other and utilize the format Clearance MM/DD/YY or Clearance TBD. Do not use dashes or single digits. - If request is for dental extraction, please clarify the # of teeth to be extracted.  Request for surgical clearance:  1. What type of surgery is being performed? Colonoscopy   2. When is this surgery scheduled? 06/07/20   3. What type of clearance is required (medical clearance vs. Pharmacy clearance to hold med vs. Both)? Both   4. Are there any medications that need to be held prior to surgery and how long? Eliquis    5. Practice name and name of physician performing surgery? [facility], Dr. Watt Climes  6. What is the office phone number? [phone]    7.   What is the office fax number? [phone]  8.   Anesthesia type (None, local, MAC, general) ? Not listed   Jacqulynn Cadet 04/27/2020, 5:03 PM  _________________________________________________________________   (provider comments below)

## 2020-04-30 NOTE — Telephone Encounter (Signed)
Called and LVM to call back and ask to speak to pre-op team.  Tezra Mahr David Stall, PA-C 04/30/2020, 10:56 AM

## 2020-04-30 NOTE — Telephone Encounter (Signed)
Patient with diagnosis of afib on Eliquis for anticoagulation.    Procedure: colonoscopy Date of procedure: 06/07/20  CHA2DS2-VASc Score = 3  This indicates a 3.2% annual risk of stroke. The patient's score is based upon: CHF History: Yes HTN History: Yes Diabetes History: No Stroke History: No Vascular Disease History: No Age Score: 1 Gender Score: 0     CrCl 50.99 ml/min Platelet count 353  Per office protocol, patient can hold Eliquis for 1-2 days prior to procedure.

## 2020-05-02 ENCOUNTER — Telehealth: Payer: Self-pay | Admitting: Cardiology

## 2020-05-02 NOTE — Telephone Encounter (Signed)
Follow Up:      Pt is returning a call from today. 

## 2020-05-02 NOTE — Telephone Encounter (Signed)
Called =home phone listed - pt was at work, I was instructed to call his cell phone. Attempted cell phone, but it rang straight to VM. Left message to return call for clearance.

## 2020-05-10 NOTE — Telephone Encounter (Signed)
   Name: Daniel Aguirre  DOB: 1950/07/17  MRN: [id number]   Primary Cardiologist: Rollene Rotunda, MD  Chart reviewed as part of pre-operative protocol coverage. Patient was contacted 05/10/2020 in reference to pre-operative risk assessment for pending surgery as outlined below.  Daniel Aguirre was last seen on 04/13/2020 by Dr. Antoine Poche.  Since that day, Daniel Aguirre has done well without any exertional chest pain or worsening dyspnea.  Therefore, based on ACC/AHA guidelines, the patient would be at acceptable risk for the planned procedure without further cardiovascular testing.   Patient has been instructed to hold Eliquis for 2 days prior to the procedure and restart as soon as possible afterward at the surgeon's discretion.  The patient was advised that if he develops new symptoms prior to surgery to contact our office to arrange for a follow-up visit, and he verbalized understanding.  I will route this recommendation to the requesting party via Epic fax function and remove from pre-op pool. Please call with questions.  [place], [place] 05/10/2020, 6:23 PM

## 2020-05-22 ENCOUNTER — Other Ambulatory Visit: Payer: Self-pay | Admitting: *Deleted

## 2020-05-22 MED ORDER — DILTIAZEM HCL ER COATED BEADS 240 MG PO CP24: 240.0000 mg | ORAL_CAPSULE | Freq: Every day | ORAL | 2 refills | Status: DC

## 2020-05-22 NOTE — Telephone Encounter (Signed)
Rx(s) sent to pharmacy electronically.  

## 2020-06-22 ENCOUNTER — Telehealth: Payer: Self-pay | Admitting: *Deleted

## 2020-06-22 NOTE — Telephone Encounter (Signed)
Patient with diagnosis of afib on Eliquis for anticoagulation.    Procedure: colostomy reversal surgery Date of procedure: TBD  CHA2DS2-VASc Score = 3  This indicates a 3.2% annual risk of stroke. The patient's score is based upon: CHF History: Yes HTN History: Yes Diabetes History: No Stroke History: No Vascular Disease History: No Age Score: 1 Gender Score: 0     CrCl 50 ml/min Platelet count 353  Per office protocol, patient can hold Eliquis for 2 days prior to procedure.

## 2020-06-22 NOTE — Telephone Encounter (Signed)
Patient is returning call regarding clearance. I was unable to contact APP covering pre-op. He requesting a call back to his cell phone at [phone] because he is not at home right now.

## 2020-06-22 NOTE — Telephone Encounter (Signed)
   Name: Daniel Aguirre  DOB: 1950-10-01  MRN: [id number]   Primary Cardiologist: Rollene Rotunda, MD  Chart reviewed as part of pre-operative protocol coverage. Patient was contacted 06/22/2020 in reference to pre-operative risk assessment for pending surgery as outlined below.  Daniel Aguirre was last seen on 04/13/20 by Dr. Antoine Poche.  Since that day, Daniel Aguirre has done well. He reports no anginal symptoms and exercise tolerance of >4 METS.  Therefore, based on ACC/AHA guidelines, the patient would be at acceptable risk for the planned procedure without further cardiovascular testing.   Per pharmacy review, he may hold Eliquis 2 days prior to planned procedure. He verbalized understanding of these instructions.   The patient was advised that if he develops new symptoms prior to surgery to contact our office to arrange for a follow-up visit, and he verbalized understanding.  I will route this recommendation to the requesting party via Epic fax function and remove from pre-op pool. Please call with questions.  Alver Sorrow, NP 06/22/2020, 3:16 PM

## 2020-06-22 NOTE — Telephone Encounter (Signed)
   [facility] Pre-operative Risk Assessment    Patient Name: Daniel Aguirre  DOB: 04-05-1950  MRN: [id number]   HEARTCARE STAFF: - Please ensure there is not already an duplicate clearance open for this procedure. - Under Visit Info/Reason for Call, type in Other and utilize the format Clearance MM/DD/YY or Clearance TBD. Do not use dashes or single digits. - If request is for dental extraction, please clarify the # of teeth to be extracted. - If the patient is currently at the dentist's office, call Pre-Op APP to address. If the patient is not currently in the dentist office, please route to the Pre-Op pool  Request for surgical clearance:  1. What type of surgery is being performed? Colostomy reversal surgery   2. When is this surgery scheduled? TBD   3. What type of clearance is required (medical clearance vs. Pharmacy clearance to hold med vs. Both)? both  4. Are there any medications that need to be held prior to surgery and how long?eliquis-need direction   5. Practice name and name of physician performing surgery? [facility]   6. What is the office phone number? [phone]   7.   What is the office fax number? [phone]  8.   Anesthesia type (None, local, MAC, general) ? general   Fredia Beets 06/22/2020, 1:30 PM  _________________________________________________________________   (provider comments below)

## 2020-06-22 NOTE — Telephone Encounter (Signed)
   Name: AERION BAGDASARIAN  DOB: 1950-04-08  MRN: [id number]   Primary Cardiologist: Rollene Rotunda, MD  Chart reviewed as part of pre-operative protocol coverage. Patient was contacted 06/22/2020 in reference to pre-operative risk assessment for pending surgery as outlined below.   Left VM requesting call back.   Alver Sorrow, NP 06/22/2020, 2:53 PM

## 2020-06-26 ENCOUNTER — Other Ambulatory Visit: Payer: Self-pay | Admitting: Surgery

## 2020-06-26 DIAGNOSIS — K5780 Diverticulitis of intestine, part unspecified, with perforation and abscess without bleeding: Secondary | ICD-10-CM

## 2020-07-16 ENCOUNTER — Ambulatory Visit
Admission: RE | Admit: 2020-07-16 | Discharge: 2020-07-16 | Disposition: A | Discharge status: Home / Self Care | Payer: No Typology Code available for payment source | Source: Ambulatory Visit | Attending: Surgery | Admitting: Surgery

## 2020-07-16 DIAGNOSIS — K5780 Diverticulitis of intestine, part unspecified, with perforation and abscess without bleeding: Secondary | ICD-10-CM

## 2020-08-08 IMAGING — CT CT RENAL STONE PROTOCOL
2 of 3 series · 14 of 42 positions shown, 18 images · non-contrast
Comparison: CT August 09, 2009

CLINICAL DATA: Right flank pain, urgency and frequency with
hematuria

EXAM:
CT ABDOMEN AND PELVIS WITHOUT CONTRAST
TECHNIQUE: Multidetector CT imaging of the abdomen and pelvis was performed
following the standard protocol without IV contrast.

[Series 2: axial st · axial · 0.79mm/px · z∈[+109,+529]mm · 11 of 96 slices shown, 15 images]
[im 8/96  soft-tissue]
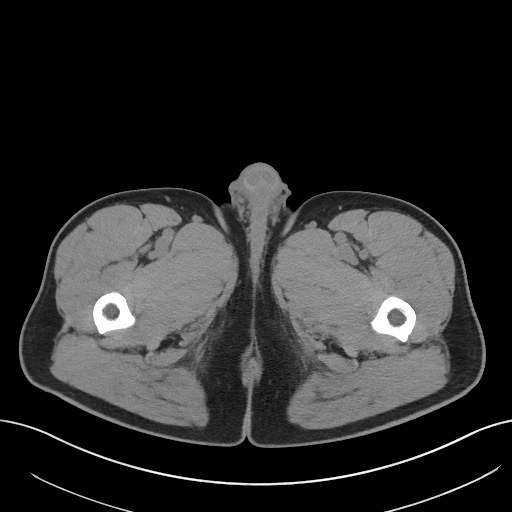
[im 8/96  bone]
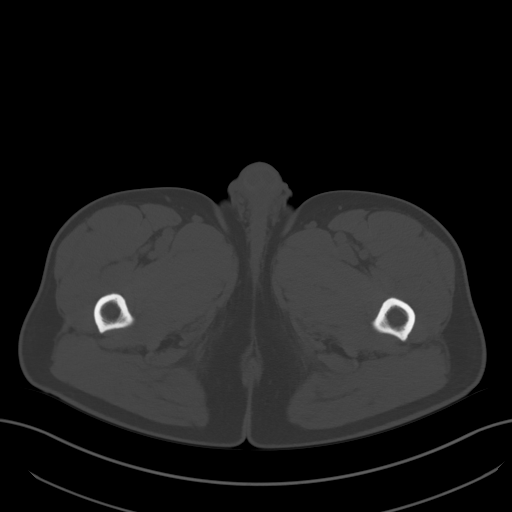
[im 20/96  soft-tissue]
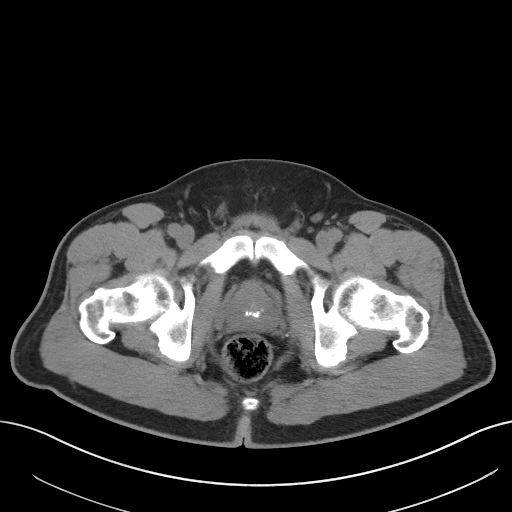
[im 27/96  soft-tissue]
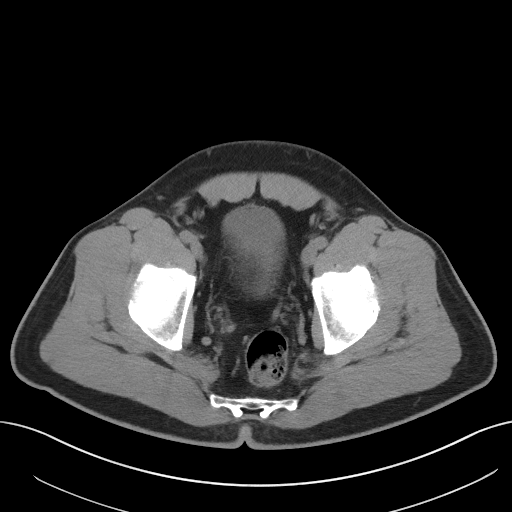
[im 39/96  soft-tissue]
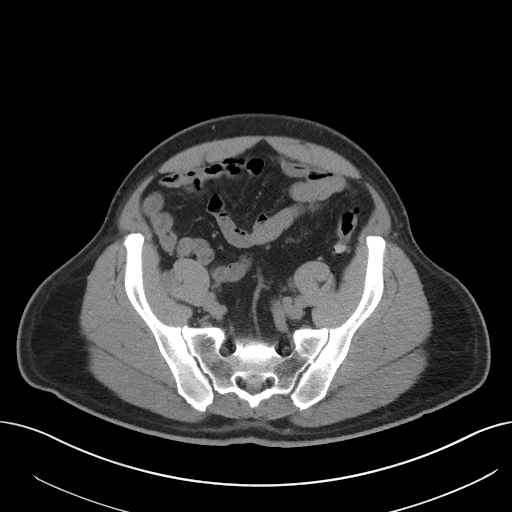
[im 50/96  soft-tissue]
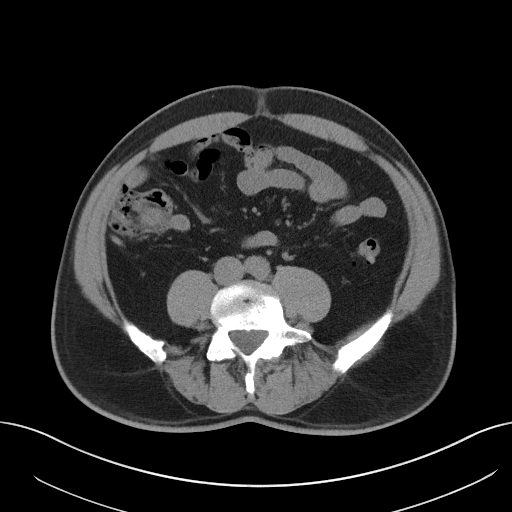
[im 58/96  soft-tissue]
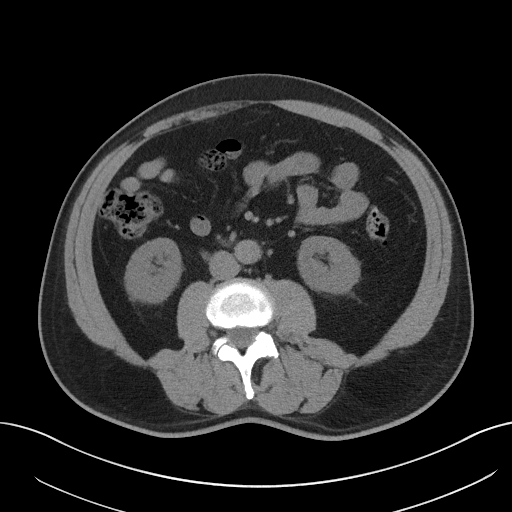
[im 69/96  soft-tissue]
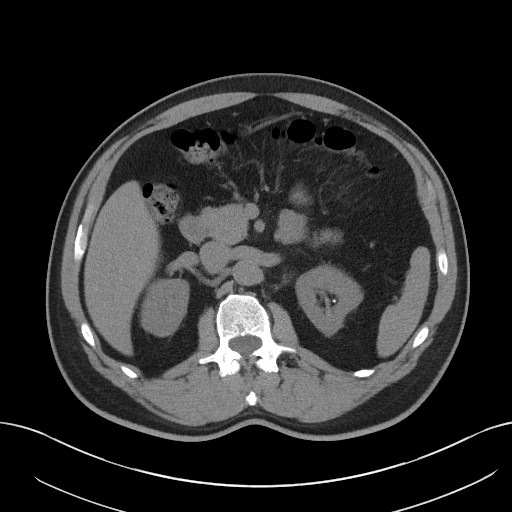
[im 80/96  soft-tissue]
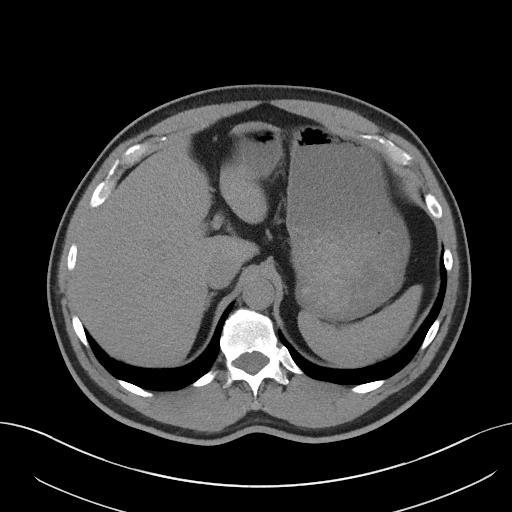
[im 80/96  lung]
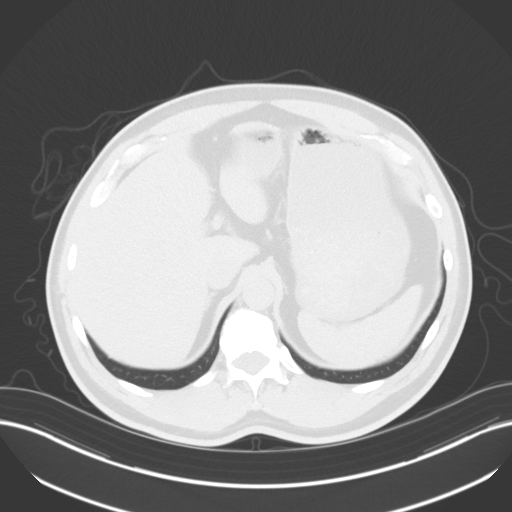
[im 84/96  lung]
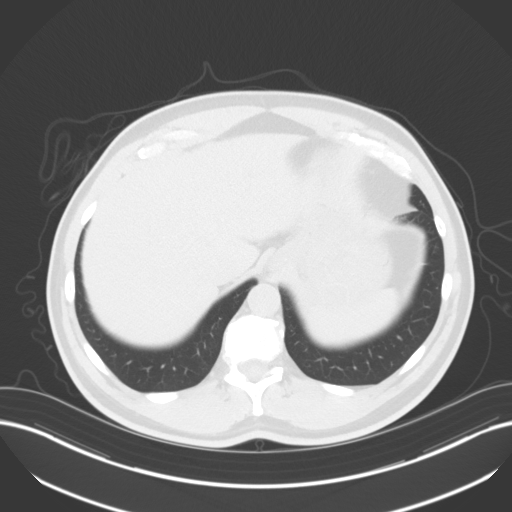
[im 88/96  soft-tissue]
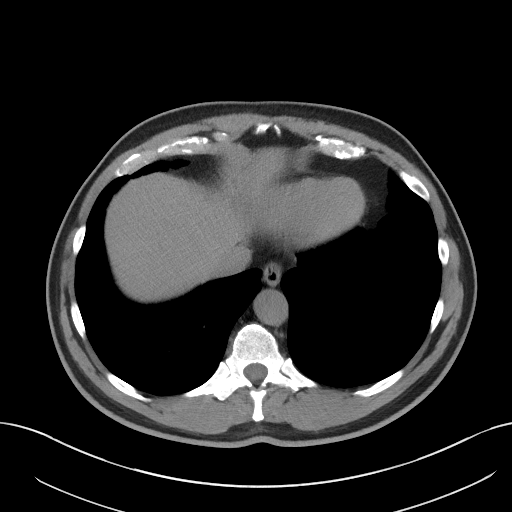
[im 88/96  lung]
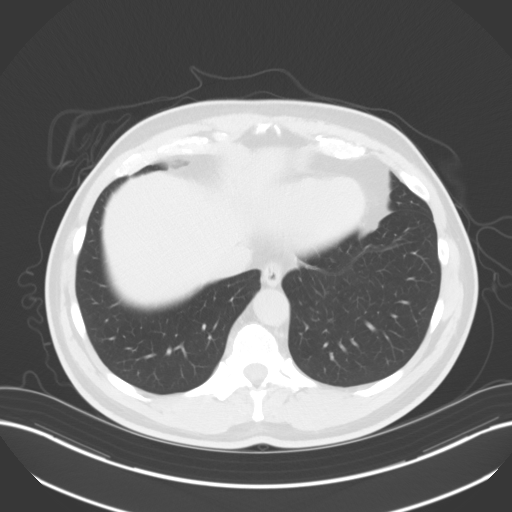
[im 88/96  bone]
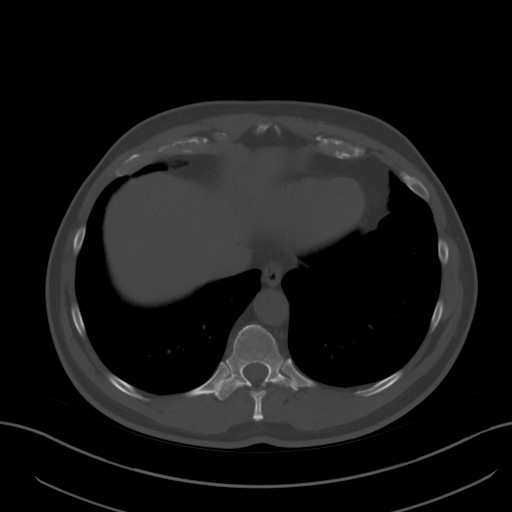
[im 92/96  lung]
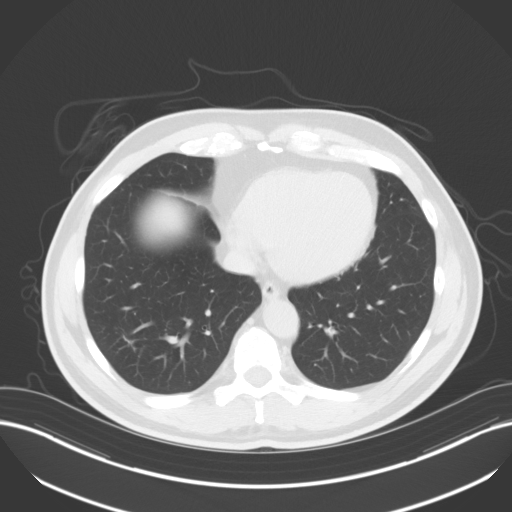

[Series 5: coronal st · coronal · 0.81mm/px · 3 of 100 slices shown]
[im 34/100  soft-tissue]
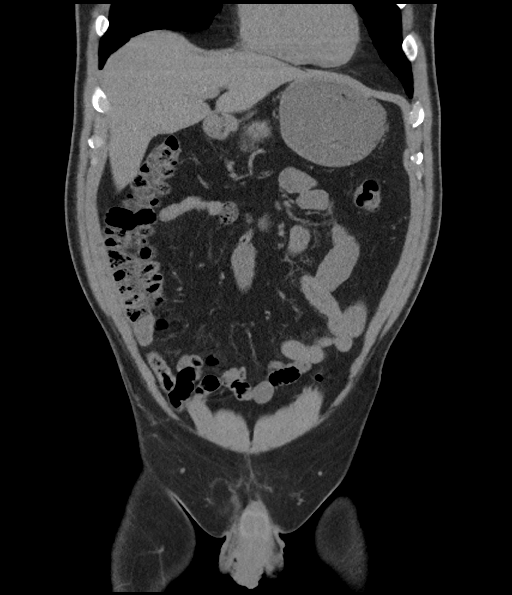
[im 45/100  soft-tissue]
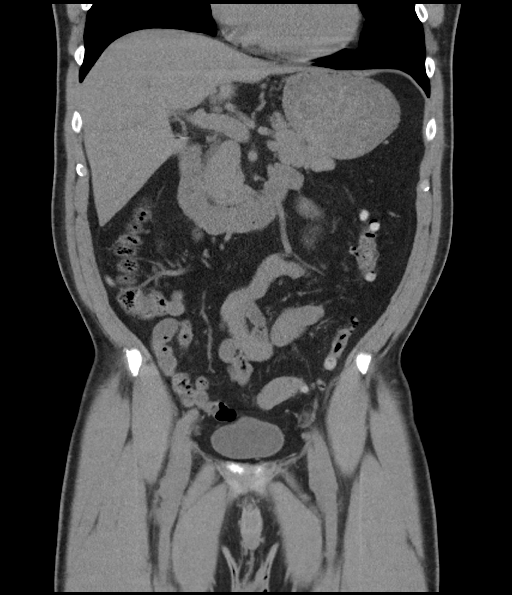
[im 56/100  soft-tissue]
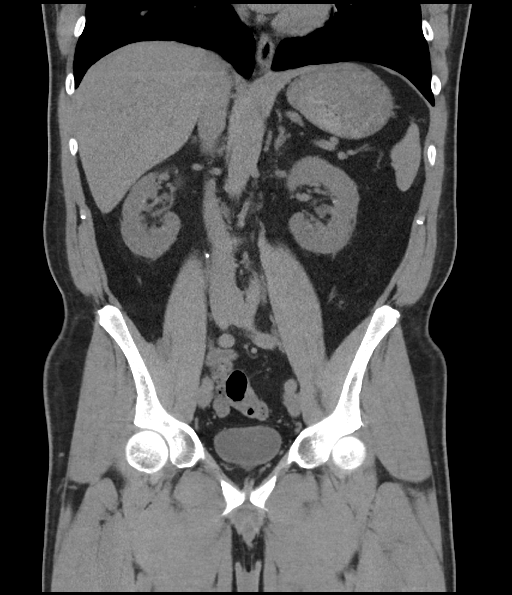

[14 of 42 positions shown; findings below may reference images not displayed]

FINDINGS: Lower chest: Bandlike area of scarring or atelectasis in lingula.
Lung bases are otherwise clear. Normal heart size. No pericardial
effusion.

Hepatobiliary: No focal liver abnormality is seen. Patient is post
cholecystectomy. Slight prominence of the biliary tree likely
related to reservoir effect. No calcified intraductal gallstones.

Pancreas: Unremarkable. No pancreatic ductal dilatation or
surrounding inflammatory changes.

Spleen: Normal in size without focal abnormality.

Adrenals/Urinary Tract: Normal adrenal glands.

Mild right hydroureteronephrosis to the level of a 3 3 mm calculus
in the proximal right ureter as it passes anterior to the psoas
(2/42 additional nonobstructing calculi present in both kidneys.
cm partially exophytic fluid attenuation cyst arises from the
anterior interpolar region left kidney, probable simple cyst. Mild
bilateral symmetric perinephric stranding, a nonspecific finding
though may correlate with either age or decreased renal function.
Urinary bladder is unremarkable. No visible urethral calcifications.

Stomach/Bowel: Distal esophagus, stomach and duodenal sweep are
unremarkable. No bowel wall thickening or dilatation. No evidence of
obstruction. A normal appendix is visualized. Scattered colonic
diverticula without focal pericolonic inflammation to suggest
diverticulitis.

Vascular/Lymphatic: No significant vascular findings are present. No
enlarged abdominal or pelvic lymph nodes.

Reproductive: Coarse eccentric calcification of the prostate. No
concerning abnormalities of the prostate or seminal vesicles.

Other: No abdominopelvic free fluid or free gas. No bowel containing
hernias. Bilateral fat containing inguinal hernias are seen.

Musculoskeletal: No acute bony abnormality. Specifically, no
fracture, subluxation, or dislocation. Mild dextrocurvature of
thoracolumbar spine, apex at L3. Multilevel degenerative changes are
present in the imaged portions of the spine. Findings are maximal at
L4-5 and L5-S1 with vacuum disc phenomenon and discogenic endplate
changes.
IMPRESSION: 1. Mild right hydroureteronephrosis to the level of a 3 mm calculus
in the proximal right ureter as it passes anterior to the psoas.
2. Additional nonobstructing bilateral nephrolithiasis.
3. Post cholecystectomy.
4. Diverticulosis without evidence of diverticulitis.

## 2020-08-24 ENCOUNTER — Other Ambulatory Visit: Payer: Self-pay | Admitting: Urology

## 2020-09-04 ENCOUNTER — Ambulatory Visit: Payer: Self-pay | Admitting: Surgery

## 2020-09-04 ENCOUNTER — Encounter (HOSPITAL_COMMUNITY): Payer: No Typology Code available for payment source

## 2020-09-04 NOTE — Patient Instructions (Addendum)
DUE TO COVID-19 ONLY ONE VISITOR IS ALLOWED TO COME WITH YOU AND STAY IN THE WAITING ROOM ONLY DURING PRE OP AND PROCEDURE.   **NO VISITORS ARE ALLOWED IN THE SHORT STAY AREA OR RECOVERY ROOM!!**  IF YOU WILL BE ADMITTED INTO THE HOSPITAL YOU ARE ALLOWED ONLY TWO SUPPORT PEOPLE DURING VISITATION HOURS ONLY (10AM -8PM)   The support person(s) may change daily. The support person(s) must pass our screening, gel in and out, and wear a mask at all times, including in the patient's room. Patients must also wear a mask when staff or their support person are in the room.  No visitors under the age of 1. Any visitor under the age of 71 must be accompanied by an adult.    COVID SWAB TESTING MUST BE COMPLETED ON:  09/25/20 **MUST PRESENT COMPLETED FORM AT TESTING SITE**    [address] [place] (backside of the building) No appointment needed. Open 8am-3pm. You are not required to quarantine, however you are required to wear a well-fitted mask when you are out and around people not in your household.  Hand Hygiene often Do NOT share personal items Notify your provider if you are in close contact with someone who has COVID or you develop fever 100.4 or greater, new onset of sneezing, cough, sore throat, shortness of breath or body aches.       Your procedure is scheduled on: 09/27/20   Report to [facility] Main  Entrance    Report to admitting at 10:00 AM   Call this number if you have problems the morning of surgery [phone]   Do not eat food :After Midnight.   May have liquids until  9:00 AM  day of surgery  CLEAR LIQUID DIET  Foods Allowed                                                                     Foods Excluded  Water, Black Coffee and tea, regular and decaf               liquids that you cannot  Plain Jell-O in any flavor  (No red)                                    see through such as: Fruit ices (not with fruit pulp)                                             milk, soups, orange juice              Iced Popsicles (No red)                                               All solid food  Apple juices Sports drinks like Gatorade (No red) Lightly seasoned clear broth or consume(fat free) Sugar, honey syrup     Complete one Ensure drink the morning of surgery at  9:00 AM the day of surgery.   Drink 2 Ensure drinks the night before surgery. Have the drinks done by 10:00 PM     The day of surgery:  Drink ONE (1) Pre-Surgery Clear Ensure by 9:00 am the morning of surgery. Drink in one sitting. Do not sip.  This drink was given to you during your hospital  pre-op appointment visit. Nothing else to drink after completing the  Pre-Surgery Clear Ensure.          If you have questions, please contact your surgeon's office.     Oral Hygiene is also important to reduce your risk of infection.                                    Remember - BRUSH YOUR TEETH THE MORNING OF SURGERY WITH YOUR REGULAR TOOTHPASTE   Take these medicines the morning of surgery with A SIP OF WATER: Diltiazem, Metoprolol Tartate, Flomax             You may not have any metal on your body including hair pins, jewelry, and body piercing             Do not wear lotions, powders,cologne, or deodorant              Men may shave face and neck.   Do not bring valuables to the hospital. [facility] IS NOT             RESPONSIBLE   FOR VALUABLES.   Bring small overnight bag day of surgery.   Special Instructions: Bring a copy of your healthcare power of attorney and living will documents         the day of surgery if you haven't scanned them in before.   Please read over the following fact sheets you were given: IF YOU HAVE QUESTIONS ABOUT YOUR PRE OP INSTRUCTIONS PLEASE CALL [phone]- [facility] - Preparing for Surgery Before surgery, you can play an important role.  Because skin is not sterile, your skin needs to be as free of  germs as possible.  You can reduce the number of germs on your skin by washing with CHG (chlorahexidine gluconate) soap before surgery.  CHG is an antiseptic cleaner which kills germs and bonds with the skin to continue killing germs even after washing. Please DO NOT use if you have an allergy to CHG or antibacterial soaps.  If your skin becomes reddened/irritated stop using the CHG and inform your nurse when you arrive at Short Stay. Do not shave (including legs and underarms) for at least 48 hours prior to the first CHG shower.  You may shave your face/neck.  Please follow these instructions carefully:  1.  Shower with CHG Soap the night before surgery and the  morning of surgery.  2.  If you choose to wash your hair, wash your hair first as usual with your normal  shampoo.  3.  After you shampoo, rinse your hair and body thoroughly to remove the shampoo.                             4.  Use CHG as  you would any other liquid soap.  You can apply chg directly to the skin and wash.  Gently with a scrungie or clean washcloth.  5.  Apply the CHG Soap to your body ONLY FROM THE NECK DOWN.   Do   not use on face/ open                           Wound or open sores. Avoid contact with eyes, ears mouth and   genitals (private parts).                       Wash face,  Genitals (private parts) with your normal soap.             6.  Wash thoroughly, paying special attention to the area where your    surgery  will be performed.  7.  Thoroughly rinse your body with warm water from the neck down.  8.  DO NOT shower/wash with your normal soap after using and rinsing off the CHG Soap.                9.  Pat yourself dry with a clean towel.            10.  Wear clean pajamas.            11.  Place clean sheets on your bed the night of your first shower and do not  sleep with pets. Day of Surgery : Do not apply any lotions/deodorants the morning of surgery.  Please wear clean clothes to the hospital/surgery  center.  FAILURE TO FOLLOW THESE INSTRUCTIONS MAY RESULT IN THE CANCELLATION OF YOUR SURGERY  PATIENT SIGNATURE_________________________________  NURSE SIGNATURE__________________________________  ________________________________________________________________________  WHAT IS A BLOOD TRANSFUSION? Blood Transfusion Information  A transfusion is the replacement of blood or some of its parts. Blood is made up of multiple cells which provide different functions. Red blood cells carry oxygen and are used for blood loss replacement. White blood cells fight against infection. Platelets control bleeding. Plasma helps clot blood. Other blood products are available for specialized needs, such as hemophilia or other clotting disorders. BEFORE THE TRANSFUSION  Who gives blood for transfusions?  Healthy volunteers who are fully evaluated to make sure their blood is safe. This is blood bank blood. Transfusion therapy is the safest it has ever been in the practice of medicine. Before blood is taken from a donor, a complete history is taken to make sure that person has no history of diseases nor engages in risky social behavior (examples are intravenous drug use or sexual activity with multiple partners). The donor's travel history is screened to minimize risk of transmitting infections, such as malaria. The donated blood is tested for signs of infectious diseases, such as HIV and hepatitis. The blood is then tested to be sure it is compatible with you in order to minimize the chance of a transfusion reaction. If you or a relative donates blood, this is often done in anticipation of surgery and is not appropriate for emergency situations. It takes many days to process the donated blood. RISKS AND COMPLICATIONS Although transfusion therapy is very safe and saves many lives, the main dangers of transfusion include:  Getting an infectious disease. Developing a transfusion reaction. This is an allergic  reaction to something in the blood you were given. Every precaution is taken to prevent this. The decision to have a blood transfusion has  been considered carefully by your caregiver before blood is given. Blood is not given unless the benefits outweigh the risks. AFTER THE TRANSFUSION Right after receiving a blood transfusion, you will usually feel much better and more energetic. This is especially true if your red blood cells have gotten low (anemic). The transfusion raises the level of the red blood cells which carry oxygen, and this usually causes an energy increase. The nurse administering the transfusion will monitor you carefully for complications. HOME CARE INSTRUCTIONS  No special instructions are needed after a transfusion. You may find your energy is better. Speak with your caregiver about any limitations on activity for underlying diseases you may have. SEEK MEDICAL CARE IF:  Your condition is not improving after your transfusion. You develop redness or irritation at the intravenous (IV) site. SEEK IMMEDIATE MEDICAL CARE IF:  Any of the following symptoms occur over the next 12 hours: Shaking chills. You have a temperature by mouth above 102 F (38.9 C), not controlled by medicine. Chest, back, or muscle pain. People around you feel you are not acting correctly or are confused. Shortness of breath or difficulty breathing. Dizziness and fainting. You get a rash or develop hives. You have a decrease in urine output. Your urine turns a dark color or changes to pink, red, or brown. Any of the following symptoms occur over the next 10 days: You have a temperature by mouth above 102 F (38.9 C), not controlled by medicine. Shortness of breath. Weakness after normal activity. The white part of the eye turns yellow (jaundice). You have a decrease in the amount of urine or are urinating less often. Your urine turns a dark color or changes to pink, red, or brown. Document Released:  01/04/2000 Document Revised: 03/31/2011 Document Reviewed: 08/23/2007 [employer] Patient Information 2014 [employer] [place].  _______________________________________________________________________     Incentive Spirometer  An incentive spirometer is a tool that can help keep your lungs clear and active. This tool measures how well you are filling your lungs with each breath. Taking long deep breaths may help reverse or decrease the chance of developing breathing (pulmonary) problems (especially infection) following: A long period of time when you are unable to move or be active. BEFORE THE PROCEDURE  If the spirometer includes an indicator to show your best effort, your nurse or respiratory therapist will set it to a desired goal. If possible, sit up straight or lean slightly forward. Try not to slouch. Hold the incentive spirometer in an upright position. INSTRUCTIONS FOR USE  Sit on the edge of your bed if possible, or sit up as far as you can in bed or on a chair. Hold the incentive spirometer in an upright position. Breathe out normally. Place the mouthpiece in your mouth and seal your lips tightly around it. Breathe in slowly and as deeply as possible, raising the piston or the ball toward the top of the column. Hold your breath for 3-5 seconds or for as long as possible. Allow the piston or ball to fall to the bottom of the column. Remove the mouthpiece from your mouth and breathe out normally. Rest for a few seconds and repeat Steps 1 through 7 at least 10 times every 1-2 hours when you are awake. Take your time and take a few normal breaths between deep breaths. The spirometer may include an indicator to show your best effort. Use the indicator as a goal to work toward during each repetition. After each set of 10 deep breaths, practice  coughing to be sure your lungs are clear. If you have an incision (the cut made at the time of surgery), support your incision when coughing by placing a  pillow or rolled up towels firmly against it. Once you are able to get out of bed, walk around indoors and cough well. You may stop using the incentive spirometer when instructed by your caregiver.  RISKS AND COMPLICATIONS Take your time so you do not get dizzy or light-headed. If you are in pain, you may need to take or ask for pain medication before doing incentive spirometry. It is harder to take a deep breath if you are having pain. AFTER USE Rest and breathe slowly and easily. It can be helpful to keep track of a log of your progress. Your caregiver can provide you with a simple table to help with this. If you are using the spirometer at home, follow these instructions: SEEK MEDICAL CARE IF:  You are having difficultly using the spirometer. You have trouble using the spirometer as often as instructed. Your pain medication is not giving enough relief while using the spirometer. You develop fever of 100.5 F (38.1 C) or higher. SEEK IMMEDIATE MEDICAL CARE IF:  You cough up bloody sputum that had not been present before. You develop fever of 102 F (38.9 C) or greater. You develop worsening pain at or near the incision site. MAKE SURE YOU:  Understand these instructions. Will watch your condition. Will get help right away if you are not doing well or get worse. Document Released: 05/19/2006 Document Revised: 03/31/2011 Document Reviewed: 07/20/2006 [facility] Patient Information 2014 [place], [place].   ________________________________________________________________________

## 2020-09-04 NOTE — Progress Notes (Addendum)
COVID swab appointment: 09/25/20  COVID Vaccine Completed: yes x2 Date COVID Vaccine completed:04/09/2019, 12/27/2019 Has received booster: COVID vaccine manufacturer: Pfizer       Date of COVID positive in last 90 days: No  PCP - Gildardo Cranker, MD Cardiologist - Rollene Rotunda, MD  Cardiac Clearance note dated 06/22/20 by Earma Reading  Chest x-ray - 09/24/19 Epic EKG - 03/22/20 Epic Stress Test - 12/29/17 Epic ECHO - 10/13/19 Epic Cardiac Cath - N/a Pacemaker/ICD device last checked: N/a Spinal Cord Stimulator: N/a  Sleep Study - yes at home study, moderate apnea CPAP - no CPAP  Fasting Blood Sugar - N/a Checks Blood Sugar _____ times a day  Blood Thinner Instructions: Eliquis, stop 3 days before surgery Last Dose: 09/23/20  Activity level: Can go up a flight of stairs and perform activities of daily living without stopping and without symptoms of chest pain or shortness of breath.       Anesthesia review: A fib, HTN,  Patient denies shortness of breath, fever, cough and chest pain at PAT appointment   Patient verbalized understanding of instructions that were given to them at the PAT appointment. Patient was also instructed that they will need to review over the PAT instructions again at home before surgery.

## 2020-09-04 NOTE — Progress Notes (Signed)
Please place orders for PAT appointment scheduled 09/05/20.

## 2020-09-05 ENCOUNTER — Encounter (HOSPITAL_COMMUNITY): Payer: Self-pay

## 2020-09-05 ENCOUNTER — Encounter (HOSPITAL_COMMUNITY)
Admission: RE | Admit: 2020-09-05 | Discharge: 2020-09-05 | Disposition: A | Discharge status: Home / Self Care | Payer: No Typology Code available for payment source | Source: Ambulatory Visit | Attending: Surgery | Admitting: Surgery

## 2020-09-05 ENCOUNTER — Other Ambulatory Visit: Payer: Self-pay

## 2020-09-05 DIAGNOSIS — Z01812 Encounter for preprocedural laboratory examination: Secondary | ICD-10-CM | POA: Diagnosis present

## 2020-09-05 LAB — COMPREHENSIVE METABOLIC PANEL
ALT: 27 U/L (ref 0–44)
AST: 20 U/L (ref 15–41)
Albumin: 3.9 g/dL (ref 3.5–5.0)
Alkaline Phosphatase: 64 U/L (ref 38–126)
Anion gap: 6 (ref 5–15)
BUN: 15 mg/dL (ref 8–23)
CO2: 27 mmol/L (ref 22–32)
Calcium: 9.1 mg/dL (ref 8.9–10.3)
Chloride: 103 mmol/L (ref 98–111)
Creatinine, Ser: 1.47 mg/dL — ABNORMAL HIGH (ref 0.61–1.24)
GFR, Estimated: 52 mL/min — ABNORMAL LOW (ref >60)
Glucose, Bld: 105 mg/dL — ABNORMAL HIGH (ref 70–99)
Potassium: 4.9 mmol/L (ref 3.5–5.1)
Sodium: 136 mmol/L (ref 135–145)
Total Bilirubin: 1.5 mg/dL — ABNORMAL HIGH (ref 0.3–1.2)
Total Protein: 7 g/dL (ref 6.5–8.1)

## 2020-09-05 LAB — CBC WITH DIFFERENTIAL/PLATELET
Abs Immature Granulocytes: 0.03 10*3/uL (ref 0.00–0.07)
Basophils Absolute: 0.1 10*3/uL (ref 0.0–0.1)
Basophils Relative: 1 %
Eosinophils Absolute: 0.2 10*3/uL (ref 0.0–0.5)
Eosinophils Relative: 2 %
HCT: 47.7 % (ref 39.0–52.0)
Hemoglobin: 15.9 g/dL (ref 13.0–17.0)
Immature Granulocytes: 1 %
Lymphocytes Relative: 20 %
Lymphs Abs: 1.3 10*3/uL (ref 0.7–4.0)
MCH: 30.3 pg (ref 26.0–34.0)
MCHC: 33.3 g/dL (ref 30.0–36.0)
MCV: 91 fL (ref 80.0–100.0)
Monocytes Absolute: 0.7 10*3/uL (ref 0.1–1.0)
Monocytes Relative: 10 %
Neutro Abs: 4.3 10*3/uL (ref 1.7–7.7)
Neutrophils Relative %: 66 %
Platelets: 204 10*3/uL (ref 150–400)
RBC: 5.24 MIL/uL (ref 4.22–5.81)
RDW: 13.5 % (ref 11.5–15.5)
WBC: 6.5 10*3/uL (ref 4.0–10.5)
nRBC: 0 % (ref 0.0–0.2)

## 2020-09-05 LAB — HEMOGLOBIN A1C
Hgb A1c MFr Bld: 5.5 % (ref 4.8–5.6)
Mean Plasma Glucose: 111.15 mg/dL

## 2020-09-05 LAB — APTT: aPTT: 39 seconds — ABNORMAL HIGH (ref 24–36)

## 2020-09-05 LAB — PROTIME-INR
INR: 1 (ref 0.8–1.2)
Prothrombin Time: 13.3 seconds (ref 11.4–15.2)

## 2020-09-06 NOTE — Anesthesia Preprocedure Evaluation (Addendum)
Anesthesia Evaluation  Patient identified by MRN, date of birth, ID band Patient awake    Reviewed: Allergy & Precautions, NPO status , Patient's Chart, lab work & pertinent test results  Airway Mallampati: II  TM Distance: >3 FB Neck ROM: Full    Dental no notable dental hx. (+) Dental Advisory Given   Pulmonary neg pulmonary ROS,    Pulmonary exam normal breath sounds clear to auscultation       Cardiovascular hypertension, Normal cardiovascular exam+ dysrhythmias + Valvular Problems/Murmurs AI  Rhythm:Regular Rate:Normal     Neuro/Psych negative neurological ROS     GI/Hepatic negative GI ROS, Neg liver ROS,   Endo/Other  negative endocrine ROS  Renal/GU Renal disease     Musculoskeletal negative musculoskeletal ROS (+)   Abdominal   Peds  Hematology negative hematology ROS (+)   Anesthesia Other Findings   Reproductive/Obstetrics                                                            Anesthesia Evaluation  Patient identified by MRN, date of birth, ID band Patient awake    Reviewed: Allergy & Precautions, NPO status , Patient's Chart, lab work & pertinent test results, reviewed documented beta blocker date and time   Airway Mallampati: II  TM Distance: >3 FB Neck ROM: Full    Dental  (+) Dental Advisory Given, Teeth Intact   Pulmonary neg pulmonary ROS,    Pulmonary exam normal        Cardiovascular hypertension, Pt. on medications and Pt. on home beta blockers + dysrhythmias Atrial Fibrillation  Rhythm:Irregular Rate:Tachycardia  9/21 Echo Left ventricular ejection fraction, by estimation, is 60 to 65%. The left ventricle has normal function. The left ventricle has no regional wall motion abnormalities. There is moderate left ventricular hypertrophy. Left ventricular diastolic parameters are consistent with Grade I diastolic dysfunction (impaired  relaxation). 2. Right ventricular systolic function is normal. The right ventricular size is normal.   Neuro/Psych negative neurological ROS  negative psych ROS   GI/Hepatic Neg liver ROS,  Perforated diverticulitis    Endo/Other  negative endocrine ROS  Renal/GU CRFRenal disease     Musculoskeletal negative musculoskeletal ROS (+)   Abdominal   Peds  Hematology  On eliquis     Anesthesia Other Findings Covid test negative   Reproductive/Obstetrics                            Anesthesia Physical Anesthesia Plan  ASA: III  Anesthesia Plan: General   Post-op Pain Management:    Induction: Intravenous and Rapid sequence  PONV Risk Score and Plan: 3 and Treatment may vary due to age or medical condition, Ondansetron, Dexamethasone and Midazolam  Airway Management Planned: Oral ETT  Additional Equipment: None  Intra-op Plan:   Post-operative Plan: Extubation in OR  Informed Consent: I have reviewed the patients History and Physical, chart, labs and discussed the procedure including the risks, benefits and alternatives for the proposed anesthesia with the patient or authorized representative who has indicated his/her understanding and acceptance.     Dental advisory given  Plan Discussed with: CRNA and Anesthesiologist  Anesthesia Plan Comments:        Anesthesia Quick Evaluation  Anesthesia Physical Anesthesia Plan  ASA: 3  Anesthesia Plan: General   Post-op Pain Management:    Induction: Intravenous  PONV Risk Score and Plan: 4 or greater and Ondansetron, Dexamethasone, Treatment may vary due to age or medical condition, Midazolam and Diphenhydramine  Airway Management Planned: Oral ETT  Additional Equipment: None  Intra-op Plan:   Post-operative Plan: Extubation in OR  Informed Consent: I have reviewed the patients History and Physical, chart, labs and discussed the procedure including the risks, benefits  and alternatives for the proposed anesthesia with the patient or authorized representative who has indicated his/her understanding and acceptance.     Dental advisory given  Plan Discussed with: CRNA  Anesthesia Plan Comments: (See APP note by Joslyn Hy, FNP )      Anesthesia Quick Evaluation

## 2020-09-06 NOTE — Progress Notes (Signed)
Anesthesia Chart Review:   Case: [id number] Date/Time: 09/27/20 1145   Procedures:      XI ROBOTIC ASSISTED COLOSTOMY TAKEDOWN     POSSIBLE PARTIAL COLECTOMY     FLEXIBLE SIGMOIDOSCOPY     CYSTOSCOPY with FIREFLY INJECTION   Anesthesia type: General   Pre-op diagnosis: COLOSOTMY STATUS   Location[facility] ROOM 05 / [facility]   Surgeons: Andria Meuse, MD; Crist Fat, MD       DISCUSSION: Pt is 70 years old with hx HTN, atrial fibrillation, moderate aortic insufficiency, RBBB, CKD (stage III)  Hospitalized 2/28-3/14/22 for diverticulitis and perforation/fistulous tract s/p sigmoid colectomy, colostomy and Hartman's pouch procedure. Complicated by afib with RVR, post-op ileus, anasarca, hypertensive urgency  Hospitalized 1/2-9/22 for diverticulitis with perforation and abcess. Not amenable to IR drainage. Elected conservative management with antibiotics. Complicated by 14 beat run of vtach - no recurrence.   - Pt to hold eliquis 2 days before surgery  VS: BP (!) 134/94   Pulse 68   Temp 36.8 C (Oral)   Resp 16   Ht 5\' 8"  (1.727 m)   Wt 84.9 kg   SpO2 99%   BMI 28.46 kg/m   PROVIDERS: - PCP is , MD - Cardiologist is Daisy Floro, MD. Last office visit 04/13/20. Cleared for surgery at acceptable risk by 04/15/20, NP on 06/22/20   LABS: Labs reviewed: Acceptable for surgery. (all labs ordered are listed, but only abnormal results are displayed)  Labs Reviewed  COMPREHENSIVE METABOLIC PANEL - Abnormal; Notable for the following components:      Result Value   Glucose, Bld 105 (*)    Creatinine, Ser 1.47 (*)    Total Bilirubin 1.5 (*)    GFR, Estimated 52 (*)    All other components within normal limits  APTT - Abnormal; Notable for the following components:   aPTT 39 (*)    All other components within normal limits  HEMOGLOBIN A1C  CBC WITH DIFFERENTIAL/PLATELET  PROTIME-INR     IMAGES: CXR 09/24/19: No active cardiopulmonary  disease   EKG 03/22/20: (in setting of admission for diverticulitis with perforation): Sinus tachycardia (108 bpm) Possible Left atrial enlargement Right bundle branch block Left anterior fascicular block Minimal voltage criteria for LVH, may be normal variant ( R in aVL )   CV: Echo 10/13/19:  1. Left ventricular ejection fraction, by estimation, is 60 to 65%. The left ventricle has normal function. The left ventricle has no regional wall motion abnormalities. There is moderate left ventricular hypertrophy. Left ventricular diastolic  parameters are consistent with Grade I diastolic dysfunction (impaired relaxation).   2. Right ventricular systolic function is normal. The right ventricular size is normal.   3. The mitral valve is normal in structure. Trivial mitral valve  regurgitation. No evidence of mitral stenosis.   4. The aortic valve is tricuspid. There is mild calcification of the aortic valve. There is mild thickening of the aortic valve. Aortic valve regurgitation is mild to moderate. Mild aortic valve sclerosis is present, with no evidence of aortic valve  stenosis.   5. There is borderline dilatation of the ascending aorta, measuring 39 mm.   6. The inferior vena cava is normal in size with greater than 50% respiratory variability, suggesting right atrial pressure of 3 mmHg.   Nuclear stress test 12/29/17:  The left ventricular ejection fraction is normal (55-65%). Nuclear stress EF: 61%. There was no ST segment deviation noted during stress. The study is  normal. This is a low risk study. Normal resting and stress perfusion. No ischemia or infarction EF 61%   Past Medical History:  Diagnosis Date   Chronic kidney disease with active medical management without dialysis, stage 3 (moderate) (HCC) 12/20/2017   CKD (chronic kidney disease), stage III (HCC)    Diastolic dysfunction    a. 09/2015 Echo: EF 65-70%, no rwma, Gr1 DD, Mod AI. Asc AO 76mm. Mildly dil Asc Ao.  LA 74mm.    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 09/2015 Echo: Mod AI.   Right bundle branch block    Testicular hypofunction     Past Surgical History:  Procedure Laterality Date   CHOLECYSTECTOMY     COLOSTOMY N/A 03/23/2020   Procedure: COLOSTOMY;  Surgeon: Harriette Bouillon, MD;  Location: [facility];  Service: General;  Laterality: N/A;   LAPAROTOMY N/A 03/23/2020   Procedure: EXPLORATORY LAPAROTOMY;  Surgeon: Harriette Bouillon, MD;  Location: [facility];  Service: General;  Laterality: N/A;   MOHS SURGERY     PARTIAL COLECTOMY N/A 03/23/2020   Procedure: PARTIAL COLECTOMY;  Surgeon: Harriette Bouillon, MD;  Location: [facility];  Service: General;  Laterality: N/A;    MEDICATIONS:  acetaminophen (TYLENOL) 325 MG tablet   acetaminophen (TYLENOL) 650 MG CR tablet   Cholecalciferol (VITAMIN D3) 125 MCG (5000 UT) CAPS   diltiazem (CARDIZEM CD) 240 MG 24 hr capsule   ELIQUIS 5 MG TABS tablet   EPINEPHrine 0.3 mg/0.3 mL IJ SOAJ injection   fluticasone (CUTIVATE) 0.005 % ointment   methocarbamol (ROBAXIN) 500 MG tablet   metoprolol tartrate (LOPRESSOR) 25 MG tablet   psyllium (METAMUCIL) 58.6 % powder   tamsulosin (FLOMAX) 0.4 MG CAPS capsule   testosterone (ANDROGEL) 50 MG/5GM (1%) GEL   No current facility-administered medications for this encounter.    If no changes, I anticipate pt can proceed with surgery as scheduled.   Rica Mast, PhD, FNP-BC [facility] Stay [facility]/Anesthesiology Phone: [phone] 09/06/2020 3:34 PM

## 2020-09-25 ENCOUNTER — Other Ambulatory Visit: Payer: Self-pay | Admitting: Urology

## 2020-09-25 NOTE — Progress Notes (Addendum)
COVID swab appointment: N/a, COVID positve 09/10/20   COVID Vaccine Completed: yes x2 Date COVID Vaccine completed:04/09/2019, 12/27/2019 Has received booster: COVID vaccine manufacturer: Pfizer        Date of COVID positive in last 90 days: 09/10/20- documentation from [facility] on chart   PCP - Gildardo Cranker, MD Cardiologist - Rollene Rotunda, MD   Cardiac Clearance note dated 06/22/20 by Earma Reading   Chest x-ray - 09/24/19 Epic EKG - 03/22/20 Epic Stress Test - 12/29/17 Epic ECHO - 10/13/19 Epic Cardiac Cath - N/a Pacemaker/ICD device last checked: N/a Spinal Cord Stimulator: N/a   Sleep Study - yes at home study, moderate apnea CPAP - no CPAP   Fasting Blood Sugar - N/a Checks Blood Sugar _____ times a day   Blood Thinner Instructions: Eliquis, stop 3 days before surgery Last Dose:    Activity level: Can go up a flight of stairs and perform activities of daily living without stopping and without symptoms of chest pain or shortness of breath.                                                    Anesthesia review: A fib, HTN, CKD   Patient denies shortness of breath, fever, cough and chest pain at PAT appointment     Patient verbalized understanding of instructions that were given to them at the PAT appointment. Patient was also instructed that they will need to review over the PAT instructions again at home before surgery.

## 2020-09-25 NOTE — Patient Instructions (Addendum)
DUE TO COVID-19 ONLY ONE VISITOR IS ALLOWED TO COME WITH YOU AND STAY IN THE WAITING ROOM ONLY DURING PRE OP AND PROCEDURE.   **NO VISITORS ARE ALLOWED IN THE SHORT STAY AREA OR RECOVERY ROOM!!**  IF YOU WILL BE ADMITTED INTO THE HOSPITAL YOU ARE ALLOWED ONLY TWO SUPPORT PEOPLE DURING VISITATION HOURS ONLY (10AM -8PM)   The support person(s) may change daily. The support person(s) must pass our screening, gel in and out, and wear a mask at all times, including in the patient's room. Patients must also wear a mask when staff or their support person are in the room.  No visitors under the age of 46. Any visitor under the age of 25 must be accompanied by an adult.    Your procedure is scheduled on: 10/05/20   Report to [facility] Main  Entrance   Report to Short Stay at 5:15 AM   [facility])   Call this number if you have problems the morning of surgery [phone]   Follow instructions given to you regarding diet prior to surgery.   May have liquids until  4:30 AM day of surgery  CLEAR LIQUID DIET  Foods Allowed                                                                     Foods Excluded  Water, Black Coffee and tea (No milk or creamer)         liquids that you cannot  Plain Jell-O in any flavor  (No red)                                   see through such as: Fruit ices (not with fruit pulp)                                           milk, soups, orange juice              Iced Popsicles (No red)                                              All solid food                                   Apple juices Sports drinks like Gatorade (No red) Lightly seasoned clear broth or consume(fat free) Sugar, honey syrup  Drink 2 Ensure drinks the night before surgery, have complete by 10pm.  Complete one Ensure drink the morning of surgery 3 hours prior to scheduled surgery at 4:30 AM     The day of surgery:  Drink ONE (1) Pre-Surgery Clear Ensure by 4:30 am the morning of  surgery. Drink in one sitting. Do not sip.  This drink was given to you during your hospital  pre-op appointment visit. Nothing else to drink after completing the  Pre-Surgery Clear Ensure.  If you have questions, please contact your surgeon's office.     Oral Hygiene is also important to reduce your risk of infection.                                    Remember - BRUSH YOUR TEETH THE MORNING OF SURGERY WITH YOUR REGULAR TOOTHPASTE   Take these medicines the morning of surgery with A SIP OF WATER: Tylenol, Diltiazem, Metoprolol Tartate, Flomax.                              You may not have any metal on your body including jewelry, and body piercing             Do not wear lotions, powders, cologne, or deodorant              Men may shave face and neck.   Do not bring valuables to the hospital. [facility] IS NOT             RESPONSIBLE   FOR VALUABLES.   Bring small overnight bag day of surgery.   Special Instructions: Bring a copy of your healthcare power of attorney and living will documents         the day of surgery if you haven't scanned them in before.   Please read over the following fact sheets you were given: IF YOU HAVE QUESTIONS ABOUT YOUR PRE OP INSTRUCTIONS PLEASE CALL [phone]-Wanetta Funderburke   [facility] - Preparing for Surgery Before surgery, you can play an important role.  Because skin is not sterile, your skin needs to be as free of germs as possible.  You can reduce the number of germs on your skin by washing with CHG (chlorahexidine gluconate) soap before surgery.  CHG is an antiseptic cleaner which kills germs and bonds with the skin to continue killing germs even after washing. Please DO NOT use if you have an allergy to CHG or antibacterial soaps.  If your skin becomes reddened/irritated stop using the CHG and inform your nurse when you arrive at Short Stay. Do not shave (including legs and underarms) for at least 48 hours prior to the first CHG shower.   You may shave your face/neck.  Please follow these instructions carefully:  1.  Shower with CHG Soap the night before surgery and the  morning of surgery.  2.  If you choose to wash your hair, wash your hair first as usual with your normal  shampoo.  3.  After you shampoo, rinse your hair and body thoroughly to remove the shampoo.                             4.  Use CHG as you would any other liquid soap.  You can apply chg directly to the skin and wash.  Gently with a scrungie or clean washcloth.  5.  Apply the CHG Soap to your body ONLY FROM THE NECK DOWN.   Do   not use on face/ open                           Wound or open sores. Avoid contact with eyes, ears mouth and   genitals (private parts).  Wash face,  Genitals (private parts) with your normal soap.             6.  Wash thoroughly, paying special attention to the area where your    surgery  will be performed.  7.  Thoroughly rinse your body with warm water from the neck down.  8.  DO NOT shower/wash with your normal soap after using and rinsing off the CHG Soap.                9.  Pat yourself dry with a clean towel.            10.  Wear clean pajamas.            11.  Place clean sheets on your bed the night of your first shower and do not  sleep with pets. Day of Surgery : Do not apply any lotions/deodorants the morning of surgery.  Please wear clean clothes to the hospital/surgery center.  FAILURE TO FOLLOW THESE INSTRUCTIONS MAY RESULT IN THE CANCELLATION OF YOUR SURGERY  PATIENT SIGNATURE_________________________________  NURSE SIGNATURE__________________________________  ________________________________________________________________________  WHAT IS A BLOOD TRANSFUSION? Blood Transfusion Information  A transfusion is the replacement of blood or some of its parts. Blood is made up of multiple cells which provide different functions. Red blood cells carry oxygen and are used for blood loss  replacement. White blood cells fight against infection. Platelets control bleeding. Plasma helps clot blood. Other blood products are available for specialized needs, such as hemophilia or other clotting disorders. BEFORE THE TRANSFUSION  Who gives blood for transfusions?  Healthy volunteers who are fully evaluated to make sure their blood is safe. This is blood bank blood. Transfusion therapy is the safest it has ever been in the practice of medicine. Before blood is taken from a donor, a complete history is taken to make sure that person has no history of diseases nor engages in risky social behavior (examples are intravenous drug use or sexual activity with multiple partners). The donor's travel history is screened to minimize risk of transmitting infections, such as malaria. The donated blood is tested for signs of infectious diseases, such as HIV and hepatitis. The blood is then tested to be sure it is compatible with you in order to minimize the chance of a transfusion reaction. If you or a relative donates blood, this is often done in anticipation of surgery and is not appropriate for emergency situations. It takes many days to process the donated blood. RISKS AND COMPLICATIONS Although transfusion therapy is very safe and saves many lives, the main dangers of transfusion include:  Getting an infectious disease. Developing a transfusion reaction. This is an allergic reaction to something in the blood you were given. Every precaution is taken to prevent this. The decision to have a blood transfusion has been considered carefully by your caregiver before blood is given. Blood is not given unless the benefits outweigh the risks. AFTER THE TRANSFUSION Right after receiving a blood transfusion, you will usually feel much better and more energetic. This is especially true if your red blood cells have gotten low (anemic). The transfusion raises the level of the red blood cells which carry oxygen, and  this usually causes an energy increase. The nurse administering the transfusion will monitor you carefully for complications. HOME CARE INSTRUCTIONS  No special instructions are needed after a transfusion. You may find your energy is better. Speak with your caregiver about any limitations on activity for underlying diseases you  may have. SEEK MEDICAL CARE IF:  Your condition is not improving after your transfusion. You develop redness or irritation at the intravenous (IV) site. SEEK IMMEDIATE MEDICAL CARE IF:  Any of the following symptoms occur over the next 12 hours: Shaking chills. You have a temperature by mouth above 102 F (38.9 C), not controlled by medicine. Chest, back, or muscle pain. People around you feel you are not acting correctly or are confused. Shortness of breath or difficulty breathing. Dizziness and fainting. You get a rash or develop hives. You have a decrease in urine output. Your urine turns a dark color or changes to pink, red, or brown. Any of the following symptoms occur over the next 10 days: You have a temperature by mouth above 102 F (38.9 C), not controlled by medicine. Shortness of breath. Weakness after normal activity. The white part of the eye turns yellow (jaundice). You have a decrease in the amount of urine or are urinating less often. Your urine turns a dark color or changes to pink, red, or brown. Document Released: 01/04/2000 Document Revised: 03/31/2011 Document Reviewed: 08/23/2007 ExitCare Patient Information 2014 [place], [place].  _______________________________________________________________________   Incentive Spirometer  An incentive spirometer is a tool that can help keep your lungs clear and active. This tool measures how well you are filling your lungs with each breath. Taking long deep breaths may help reverse or decrease the chance of developing breathing (pulmonary) problems (especially infection) following: A long period of  time when you are unable to move or be active. BEFORE THE PROCEDURE  If the spirometer includes an indicator to show your best effort, your nurse or respiratory therapist will set it to a desired goal. If possible, sit up straight or lean slightly forward. Try not to slouch. Hold the incentive spirometer in an upright position. INSTRUCTIONS FOR USE  Sit on the edge of your bed if possible, or sit up as far as you can in bed or on a chair. Hold the incentive spirometer in an upright position. Breathe out normally. Place the mouthpiece in your mouth and seal your lips tightly around it. Breathe in slowly and as deeply as possible, raising the piston or the ball toward the top of the column. Hold your breath for 3-5 seconds or for as long as possible. Allow the piston or ball to fall to the bottom of the column. Remove the mouthpiece from your mouth and breathe out normally. Rest for a few seconds and repeat Steps 1 through 7 at least 10 times every 1-2 hours when you are awake. Take your time and take a few normal breaths between deep breaths. The spirometer may include an indicator to show your best effort. Use the indicator as a goal to work toward during each repetition. After each set of 10 deep breaths, practice coughing to be sure your lungs are clear. If you have an incision (the cut made at the time of surgery), support your incision when coughing by placing a pillow or rolled up towels firmly against it. Once you are able to get out of bed, walk around indoors and cough well. You may stop using the incentive spirometer when instructed by your caregiver.  RISKS AND COMPLICATIONS Take your time so you do not get dizzy or light-headed. If you are in pain, you may need to take or ask for pain medication before doing incentive spirometry. It is harder to take a deep breath if you are having pain. AFTER USE Rest and breathe  slowly and easily. It can be helpful to keep track of a log of your  progress. Your caregiver can provide you with a simple table to help with this. If you are using the spirometer at home, follow these instructions: SEEK MEDICAL CARE IF:  You are having difficultly using the spirometer. You have trouble using the spirometer as often as instructed. Your pain medication is not giving enough relief while using the spirometer. You develop fever of 100.5 F (38.1 C) or higher. SEEK IMMEDIATE MEDICAL CARE IF:  You cough up bloody sputum that had not been present before. You develop fever of 102 F (38.9 C) or greater. You develop worsening pain at or near the incision site. MAKE SURE YOU:  Understand these instructions. Will watch your condition. Will get help right away if you are not doing well or get worse. Document Released: 05/19/2006 Document Revised: 03/31/2011 Document Reviewed: 07/20/2006 [facility] Patient Information 2014 [place], [place].   ________________________________________________________________________

## 2020-09-26 ENCOUNTER — Encounter (HOSPITAL_COMMUNITY)
Admission: RE | Admit: 2020-09-26 | Discharge: 2020-09-26 | Disposition: A | Discharge status: Home / Self Care | Payer: No Typology Code available for payment source | Source: Ambulatory Visit | Attending: Surgery | Admitting: Surgery

## 2020-09-26 ENCOUNTER — Other Ambulatory Visit: Payer: Self-pay

## 2020-09-26 ENCOUNTER — Encounter (HOSPITAL_COMMUNITY): Payer: Self-pay

## 2020-09-26 DIAGNOSIS — Z01812 Encounter for preprocedural laboratory examination: Secondary | ICD-10-CM | POA: Insufficient documentation

## 2020-09-26 LAB — BASIC METABOLIC PANEL
Anion gap: 7 (ref 5–15)
BUN: 16 mg/dL (ref 8–23)
CO2: 27 mmol/L (ref 22–32)
Calcium: 9.4 mg/dL (ref 8.9–10.3)
Chloride: 101 mmol/L (ref 98–111)
Creatinine, Ser: 1.39 mg/dL — ABNORMAL HIGH (ref 0.61–1.24)
GFR, Estimated: 55 mL/min — ABNORMAL LOW (ref >60)
Glucose, Bld: 118 mg/dL — ABNORMAL HIGH (ref 70–99)
Potassium: 5 mmol/L (ref 3.5–5.1)
Sodium: 135 mmol/L (ref 135–145)

## 2020-09-26 LAB — CBC
HCT: 49.2 % (ref 39.0–52.0)
Hemoglobin: 16.5 g/dL (ref 13.0–17.0)
MCH: 30.6 pg (ref 26.0–34.0)
MCHC: 33.5 g/dL (ref 30.0–36.0)
MCV: 91.1 fL (ref 80.0–100.0)
Platelets: 272 10*3/uL (ref 150–400)
RBC: 5.4 MIL/uL (ref 4.22–5.81)
RDW: 13.5 % (ref 11.5–15.5)
WBC: 6.6 10*3/uL (ref 4.0–10.5)
nRBC: 0 % (ref 0.0–0.2)

## 2020-10-04 MED ORDER — CLINDAMYCIN PHOSPHATE 900 MG/50ML IV SOLN
900.0000 mg | Freq: Once | INTRAVENOUS | Status: AC
  Administered 2020-10-05: 900 mg via INTRAVENOUS
  Filled 2020-10-04: qty 50

## 2020-10-04 MED ORDER — GENTAMICIN SULFATE 40 MG/ML IJ SOLN
5.0000 mg/kg | Freq: Once | INTRAVENOUS | Status: AC
  Administered 2020-10-05: 370 mg via INTRAVENOUS
  Filled 2020-10-04: qty 9.25

## 2020-10-05 ENCOUNTER — Inpatient Hospital Stay (HOSPITAL_COMMUNITY): Payer: No Typology Code available for payment source | Admitting: Emergency Medicine

## 2020-10-05 ENCOUNTER — Inpatient Hospital Stay (HOSPITAL_COMMUNITY)
Admission: RE | Admit: 2020-10-05 | Discharge: 2020-10-07 | DRG: 331 | Disposition: A | Discharge status: Home / Self Care | Payer: No Typology Code available for payment source | Attending: Surgery | Admitting: Surgery

## 2020-10-05 ENCOUNTER — Encounter (HOSPITAL_COMMUNITY): Payer: Self-pay | Admitting: Surgery

## 2020-10-05 ENCOUNTER — Encounter (HOSPITAL_COMMUNITY)
Admission: RE | Disposition: A | Discharge status: Home / Self Care | Payer: Self-pay | Source: Home / Self Care | Attending: Surgery

## 2020-10-05 ENCOUNTER — Inpatient Hospital Stay (HOSPITAL_COMMUNITY): Payer: No Typology Code available for payment source | Admitting: Certified Registered Nurse Anesthetist

## 2020-10-05 DIAGNOSIS — K5720 Diverticulitis of large intestine with perforation and abscess without bleeding: Secondary | ICD-10-CM | POA: Diagnosis present

## 2020-10-05 DIAGNOSIS — Z888 Allergy status to other drugs, medicaments and biological substances status: Secondary | ICD-10-CM | POA: Diagnosis not present

## 2020-10-05 DIAGNOSIS — Z800 Family history of malignant neoplasm of digestive organs: Secondary | ICD-10-CM | POA: Diagnosis not present

## 2020-10-05 DIAGNOSIS — Z8616 Personal history of COVID-19: Secondary | ICD-10-CM | POA: Diagnosis not present

## 2020-10-05 DIAGNOSIS — N1831 Chronic kidney disease, stage 3a: Secondary | ICD-10-CM | POA: Diagnosis present

## 2020-10-05 DIAGNOSIS — Z7901 Long term (current) use of anticoagulants: Secondary | ICD-10-CM

## 2020-10-05 DIAGNOSIS — Z87442 Personal history of urinary calculi: Secondary | ICD-10-CM | POA: Diagnosis not present

## 2020-10-05 DIAGNOSIS — I4510 Unspecified right bundle-branch block: Secondary | ICD-10-CM | POA: Diagnosis present

## 2020-10-05 DIAGNOSIS — Z9889 Other specified postprocedural states: Secondary | ICD-10-CM

## 2020-10-05 DIAGNOSIS — Z8249 Family history of ischemic heart disease and other diseases of the circulatory system: Secondary | ICD-10-CM

## 2020-10-05 DIAGNOSIS — Z9049 Acquired absence of other specified parts of digestive tract: Secondary | ICD-10-CM | POA: Diagnosis not present

## 2020-10-05 DIAGNOSIS — I129 Hypertensive chronic kidney disease with stage 1 through stage 4 chronic kidney disease, or unspecified chronic kidney disease: Secondary | ICD-10-CM | POA: Diagnosis present

## 2020-10-05 DIAGNOSIS — Z86010 Personal history of colonic polyps: Secondary | ICD-10-CM

## 2020-10-05 DIAGNOSIS — E785 Hyperlipidemia, unspecified: Secondary | ICD-10-CM | POA: Diagnosis present

## 2020-10-05 DIAGNOSIS — K660 Peritoneal adhesions (postprocedural) (postinfection): Secondary | ICD-10-CM | POA: Diagnosis present

## 2020-10-05 DIAGNOSIS — K5732 Diverticulitis of large intestine without perforation or abscess without bleeding: Secondary | ICD-10-CM | POA: Diagnosis present

## 2020-10-05 HISTORY — PX: FLEXIBLE SIGMOIDOSCOPY: SHX5431

## 2020-10-05 HISTORY — PX: XI ROBOTIC ASSISTED COLOSTOMY TAKEDOWN: SHX6828

## 2020-10-05 LAB — TYPE AND SCREEN
ABO/RH(D): O POS
Antibody Screen: NEGATIVE

## 2020-10-05 LAB — PROTIME-INR
INR: 1 (ref 0.8–1.2)
Prothrombin Time: 13 seconds (ref 11.4–15.2)

## 2020-10-05 LAB — APTT: aPTT: 35 seconds (ref 24–36)

## 2020-10-05 SURGERY — CLOSURE, COLOSTOMY, ROBOT-ASSISTED
Anesthesia: General | Site: Rectum

## 2020-10-05 MED ORDER — SUGAMMADEX SODIUM 200 MG/2ML IV SOLN
INTRAVENOUS | Status: DC | PRN
  Administered 2020-10-05: 200 mg via INTRAVENOUS

## 2020-10-05 MED ORDER — HYDRALAZINE HCL 20 MG/ML IJ SOLN
10.0000 mg | INTRAMUSCULAR | Status: DC | PRN
Start: 2020-10-05 — End: 2020-10-07

## 2020-10-05 MED ORDER — TAMSULOSIN HCL 0.4 MG PO CAPS
0.4000 mg | ORAL_CAPSULE | Freq: Every day | ORAL | Status: DC
  Administered 2020-10-06 – 2020-10-07 (×2): 0.4 mg via ORAL
  Filled 2020-10-05 (×2): qty 1

## 2020-10-05 MED ORDER — ALVIMOPAN 12 MG PO CAPS: 12.0000 mg | ORAL_CAPSULE | Freq: Two times a day (BID) | ORAL | Status: DC

## 2020-10-05 MED ORDER — ACETAMINOPHEN 500 MG PO TABS
1000.0000 mg | ORAL_TABLET | Freq: Four times a day (QID) | ORAL | Status: DC
  Administered 2020-10-05 – 2020-10-07 (×8): 1000 mg via ORAL
  Filled 2020-10-05 (×8): qty 2

## 2020-10-05 MED ORDER — FENTANYL CITRATE (PF) 100 MCG/2ML IJ SOLN
INTRAMUSCULAR | Status: DC | PRN
  Administered 2020-10-05: 100 ug via INTRAVENOUS
  Administered 2020-10-05 (×3): 50 ug via INTRAVENOUS

## 2020-10-05 MED ORDER — LIDOCAINE HCL (PF) 2 % IJ SOLN
INTRAMUSCULAR | Status: AC
  Filled 2020-10-05: qty 5

## 2020-10-05 MED ORDER — METOPROLOL TARTRATE 25 MG PO TABS
25.0000 mg | ORAL_TABLET | Freq: Two times a day (BID) | ORAL | Status: DC
  Administered 2020-10-05 – 2020-10-07 (×4): 25 mg via ORAL
  Filled 2020-10-05 (×4): qty 1

## 2020-10-05 MED ORDER — LACTATED RINGERS IR SOLN
Status: DC | PRN
  Administered 2020-10-05: 1000 mL

## 2020-10-05 MED ORDER — LIDOCAINE 2% (20 MG/ML) 5 ML SYRINGE
INTRAMUSCULAR | Status: DC | PRN
  Administered 2020-10-05: 80 mg via INTRAVENOUS

## 2020-10-05 MED ORDER — PHENYLEPHRINE 40 MCG/ML (10ML) SYRINGE FOR IV PUSH (FOR BLOOD PRESSURE SUPPORT)
PREFILLED_SYRINGE | INTRAVENOUS | Status: DC | PRN
  Administered 2020-10-05 (×2): 80 ug via INTRAVENOUS

## 2020-10-05 MED ORDER — PHENYLEPHRINE HCL-NACL 20-0.9 MG/250ML-% IV SOLN
INTRAVENOUS | Status: DC | PRN
  Administered 2020-10-05: 50 ug/min via INTRAVENOUS

## 2020-10-05 MED ORDER — ONDANSETRON HCL 4 MG/2ML IJ SOLN
INTRAMUSCULAR | Status: AC
  Filled 2020-10-05: qty 2

## 2020-10-05 MED ORDER — POLYETHYLENE GLYCOL 3350 17 GM/SCOOP PO POWD: 1.0000 | Freq: Once | ORAL | Status: DC

## 2020-10-05 MED ORDER — EPINEPHRINE 0.3 MG/0.3ML IJ SOAJ: 0.3000 mg | Freq: Once | INTRAMUSCULAR | Status: DC | PRN

## 2020-10-05 MED ORDER — ENSURE PRE-SURGERY PO LIQD: 592.0000 mL | Freq: Once | ORAL | Status: DC

## 2020-10-05 MED ORDER — ROCURONIUM BROMIDE 10 MG/ML (PF) SYRINGE
PREFILLED_SYRINGE | INTRAVENOUS | Status: AC
  Filled 2020-10-05: qty 10

## 2020-10-05 MED ORDER — SUCCINYLCHOLINE CHLORIDE 200 MG/10ML IV SOSY
PREFILLED_SYRINGE | INTRAVENOUS | Status: AC
  Filled 2020-10-05: qty 10

## 2020-10-05 MED ORDER — ALUM & MAG HYDROXIDE-SIMETH 200-200-20 MG/5ML PO SUSP: 30.0000 mL | Freq: Four times a day (QID) | ORAL | Status: DC | PRN

## 2020-10-05 MED ORDER — LACTATED RINGERS IV SOLN: INTRAVENOUS | Status: DC | PRN

## 2020-10-05 MED ORDER — ENSURE SURGERY PO LIQD
237.0000 mL | Freq: Two times a day (BID) | ORAL | Status: DC
  Administered 2020-10-06 (×2): 237 mL via ORAL

## 2020-10-05 MED ORDER — CHLORHEXIDINE GLUCONATE 0.12 % MT SOLN
15.0000 mL | Freq: Once | OROMUCOSAL | Status: AC
  Administered 2020-10-05: 15 mL via OROMUCOSAL

## 2020-10-05 MED ORDER — ACETAMINOPHEN 500 MG PO TABS
1000.0000 mg | ORAL_TABLET | ORAL | Status: AC
  Administered 2020-10-05: 1000 mg via ORAL
  Filled 2020-10-05: qty 2

## 2020-10-05 MED ORDER — DEXAMETHASONE SODIUM PHOSPHATE 10 MG/ML IJ SOLN
INTRAMUSCULAR | Status: DC | PRN
  Administered 2020-10-05: 10 mg via INTRAVENOUS

## 2020-10-05 MED ORDER — PROPOFOL 500 MG/50ML IV EMUL
INTRAVENOUS | Status: AC
  Filled 2020-10-05: qty 50

## 2020-10-05 MED ORDER — LACTATED RINGERS IV SOLN: INTRAVENOUS | Status: DC

## 2020-10-05 MED ORDER — TRAMADOL HCL 50 MG PO TABS: 50.0000 mg | ORAL_TABLET | Freq: Four times a day (QID) | ORAL | 0 refills | Status: AC | PRN

## 2020-10-05 MED ORDER — ENSURE PRE-SURGERY PO LIQD: 296.0000 mL | Freq: Once | ORAL | Status: DC

## 2020-10-05 MED ORDER — CHLORHEXIDINE GLUCONATE CLOTH 2 % EX PADS: 6.0000 | MEDICATED_PAD | Freq: Once | CUTANEOUS | Status: DC

## 2020-10-05 MED ORDER — FENTANYL CITRATE (PF) 250 MCG/5ML IJ SOLN
INTRAMUSCULAR | Status: AC
  Filled 2020-10-05: qty 5

## 2020-10-05 MED ORDER — EPHEDRINE SULFATE-NACL 50-0.9 MG/10ML-% IV SOSY
PREFILLED_SYRINGE | INTRAVENOUS | Status: DC | PRN
  Administered 2020-10-05: 10 mg via INTRAVENOUS
  Administered 2020-10-05: 5 mg via INTRAVENOUS
  Administered 2020-10-05: 10 mg via INTRAVENOUS

## 2020-10-05 MED ORDER — STERILE WATER FOR INJECTION IJ SOLN
INTRAMUSCULAR | Status: AC
  Filled 2020-10-05: qty 10

## 2020-10-05 MED ORDER — HYDROMORPHONE HCL 1 MG/ML IJ SOLN: 0.5000 mg | INTRAMUSCULAR | Status: DC | PRN

## 2020-10-05 MED ORDER — 0.9 % SODIUM CHLORIDE (POUR BTL) OPTIME
TOPICAL | Status: DC | PRN
  Administered 2020-10-05: 2000 mL

## 2020-10-05 MED ORDER — TRAMADOL HCL 50 MG PO TABS: 50.0000 mg | ORAL_TABLET | Freq: Four times a day (QID) | ORAL | Status: DC | PRN

## 2020-10-05 MED ORDER — HYDROMORPHONE HCL 1 MG/ML IJ SOLN
0.2500 mg | INTRAMUSCULAR | Status: DC | PRN
  Administered 2020-10-05: 0.25 mg via INTRAVENOUS

## 2020-10-05 MED ORDER — ORAL CARE MOUTH RINSE
15.0000 mL | Freq: Once | OROMUCOSAL | Status: AC
Start: 2020-10-05 — End: 2020-10-05

## 2020-10-05 MED ORDER — LIP MEDEX EX OINT
TOPICAL_OINTMENT | CUTANEOUS | Status: AC
  Filled 2020-10-05: qty 7

## 2020-10-05 MED ORDER — MIDAZOLAM HCL 2 MG/2ML IJ SOLN
INTRAMUSCULAR | Status: AC
  Filled 2020-10-05: qty 2

## 2020-10-05 MED ORDER — MIDAZOLAM HCL 2 MG/2ML IJ SOLN
INTRAMUSCULAR | Status: DC | PRN
  Administered 2020-10-05: 2 mg via INTRAVENOUS

## 2020-10-05 MED ORDER — ONDANSETRON HCL 4 MG PO TABS: 4.0000 mg | ORAL_TABLET | Freq: Four times a day (QID) | ORAL | Status: DC | PRN

## 2020-10-05 MED ORDER — KETAMINE HCL 10 MG/ML IJ SOLN
INTRAMUSCULAR | Status: DC | PRN
  Administered 2020-10-05: 30 mg via INTRAVENOUS

## 2020-10-05 MED ORDER — PHENYLEPHRINE HCL (PRESSORS) 10 MG/ML IV SOLN
INTRAVENOUS | Status: AC
  Filled 2020-10-05: qty 2

## 2020-10-05 MED ORDER — BUPIVACAINE-EPINEPHRINE (PF) 0.25% -1:200000 IJ SOLN
INTRAMUSCULAR | Status: DC | PRN
  Administered 2020-10-05: 30 mL

## 2020-10-05 MED ORDER — DIPHENHYDRAMINE HCL 50 MG/ML IJ SOLN: 12.5000 mg | Freq: Four times a day (QID) | INTRAMUSCULAR | Status: DC | PRN

## 2020-10-05 MED ORDER — HYDROMORPHONE HCL 1 MG/ML IJ SOLN
INTRAMUSCULAR | Status: AC
  Filled 2020-10-05: qty 1

## 2020-10-05 MED ORDER — BUPIVACAINE LIPOSOME 1.3 % IJ SUSP
20.0000 mL | Freq: Once | INTRAMUSCULAR | Status: AC
  Administered 2020-10-05: 20 mL

## 2020-10-05 MED ORDER — SIMETHICONE 80 MG PO CHEW: 40.0000 mg | CHEWABLE_TABLET | Freq: Four times a day (QID) | ORAL | Status: DC | PRN

## 2020-10-05 MED ORDER — DILTIAZEM HCL ER COATED BEADS 240 MG PO CP24
240.0000 mg | ORAL_CAPSULE | Freq: Every day | ORAL | Status: DC
  Administered 2020-10-06 – 2020-10-07 (×2): 240 mg via ORAL
  Filled 2020-10-05 (×2): qty 1

## 2020-10-05 MED ORDER — DEXAMETHASONE SODIUM PHOSPHATE 10 MG/ML IJ SOLN
INTRAMUSCULAR | Status: AC
  Filled 2020-10-05: qty 1

## 2020-10-05 MED ORDER — MEPERIDINE HCL 50 MG/ML IJ SOLN: 6.2500 mg | INTRAMUSCULAR | Status: DC | PRN

## 2020-10-05 MED ORDER — PHENYLEPHRINE 40 MCG/ML (10ML) SYRINGE FOR IV PUSH (FOR BLOOD PRESSURE SUPPORT)
PREFILLED_SYRINGE | INTRAVENOUS | Status: AC
  Filled 2020-10-05: qty 10

## 2020-10-05 MED ORDER — ROCURONIUM BROMIDE 10 MG/ML (PF) SYRINGE
PREFILLED_SYRINGE | INTRAVENOUS | Status: DC | PRN
  Administered 2020-10-05: 20 mg via INTRAVENOUS
  Administered 2020-10-05: 40 mg via INTRAVENOUS
  Administered 2020-10-05: 60 mg via INTRAVENOUS
  Administered 2020-10-05: 20 mg via INTRAVENOUS

## 2020-10-05 MED ORDER — STERILE WATER FOR IRRIGATION IR SOLN
Status: DC | PRN
  Administered 2020-10-05: 20 mL

## 2020-10-05 MED ORDER — 0.9 % SODIUM CHLORIDE (POUR BTL) OPTIME
TOPICAL | Status: DC | PRN
  Administered 2020-10-05: 1000 mL

## 2020-10-05 MED ORDER — BUPIVACAINE-EPINEPHRINE (PF) 0.25% -1:200000 IJ SOLN
INTRAMUSCULAR | Status: AC
  Filled 2020-10-05: qty 30

## 2020-10-05 MED ORDER — METRONIDAZOLE 500 MG PO TABS: 1000.0000 mg | ORAL_TABLET | ORAL | Status: DC

## 2020-10-05 MED ORDER — BISACODYL 5 MG PO TBEC: 20.0000 mg | DELAYED_RELEASE_TABLET | Freq: Once | ORAL | Status: DC

## 2020-10-05 MED ORDER — SODIUM CHLORIDE 0.9 % IR SOLN
Status: DC | PRN
  Administered 2020-10-05: 1000 mL

## 2020-10-05 MED ORDER — ONDANSETRON HCL 4 MG/2ML IJ SOLN: 4.0000 mg | Freq: Four times a day (QID) | INTRAMUSCULAR | Status: DC | PRN

## 2020-10-05 MED ORDER — ONDANSETRON HCL 4 MG/2ML IJ SOLN
INTRAMUSCULAR | Status: DC | PRN
Start: 2020-10-05 — End: 2020-10-05
  Administered 2020-10-05 (×2): 4 mg via INTRAVENOUS

## 2020-10-05 MED ORDER — NEOMYCIN SULFATE 500 MG PO TABS: 1000.0000 mg | ORAL_TABLET | ORAL | Status: DC

## 2020-10-05 MED ORDER — PROPOFOL 10 MG/ML IV BOLUS
INTRAVENOUS | Status: DC | PRN
  Administered 2020-10-05: 150 mg via INTRAVENOUS

## 2020-10-05 MED ORDER — HEPARIN SODIUM (PORCINE) 5000 UNIT/ML IJ SOLN
5000.0000 [IU] | Freq: Once | INTRAMUSCULAR | Status: AC
  Administered 2020-10-05: 5000 [IU] via SUBCUTANEOUS
  Filled 2020-10-05: qty 1

## 2020-10-05 MED ORDER — INDOCYANINE GREEN 25 MG IV SOLR
INTRAVENOUS | Status: DC | PRN
  Administered 2020-10-05: 25 mg

## 2020-10-05 MED ORDER — PROMETHAZINE HCL 25 MG/ML IJ SOLN: 6.2500 mg | INTRAMUSCULAR | Status: DC | PRN

## 2020-10-05 MED ORDER — HEPARIN SODIUM (PORCINE) 5000 UNIT/ML IJ SOLN
5000.0000 [IU] | Freq: Three times a day (TID) | INTRAMUSCULAR | Status: DC
  Administered 2020-10-06 – 2020-10-07 (×4): 5000 [IU] via SUBCUTANEOUS
  Filled 2020-10-05 (×4): qty 1

## 2020-10-05 MED ORDER — DIPHENHYDRAMINE HCL 12.5 MG/5ML PO ELIX: 12.5000 mg | ORAL_SOLUTION | Freq: Four times a day (QID) | ORAL | Status: DC | PRN

## 2020-10-05 MED ORDER — BUPIVACAINE LIPOSOME 1.3 % IJ SUSP
INTRAMUSCULAR | Status: AC
  Filled 2020-10-05: qty 20

## 2020-10-05 MED ORDER — EPHEDRINE 5 MG/ML INJ
INTRAVENOUS | Status: AC
  Filled 2020-10-05: qty 5

## 2020-10-05 MED ORDER — ALVIMOPAN 12 MG PO CAPS
12.0000 mg | ORAL_CAPSULE | ORAL | Status: AC
  Administered 2020-10-05: 12 mg via ORAL
  Filled 2020-10-05: qty 1

## 2020-10-05 MED ORDER — KETAMINE HCL 10 MG/ML IJ SOLN
INTRAMUSCULAR | Status: AC
  Filled 2020-10-05: qty 1

## 2020-10-05 SURGICAL SUPPLY — 122 items
ADAPTER GOLDBERG URETERAL (ADAPTER) IMPLANT
ADH SKN CLS APL DERMABOND .7 (GAUZE/BANDAGES/DRESSINGS) ×3
ADPR CATH 15X14FR FL DRN BG (ADAPTER)
APL PRP STRL LF DISP 70% ISPRP (MISCELLANEOUS) ×3
APPLIER CLIP 5 13 M/L LIGAMAX5 (MISCELLANEOUS)
APPLIER CLIP ROT 10 11.4 M/L (STAPLE)
APR CLP MED LRG 11.4X10 (STAPLE)
APR CLP MED LRG 5 ANG JAW (MISCELLANEOUS)
BAG COUNTER SPONGE SURGICOUNT (BAG) IMPLANT
BAG SPNG CNTER NS LX DISP (BAG)
BAG URO CATCHER STRL LF (MISCELLANEOUS) ×4 IMPLANT
CANNULA REDUC XI 12-8 STAPL (CANNULA) ×4
CANNULA REDUCER 12-8 DVNC XI (CANNULA) ×3 IMPLANT
CATH URET 5FR 28IN OPEN ENDED (CATHETERS) ×4 IMPLANT
CELLS DAT CNTRL 66122 CELL SVR (MISCELLANEOUS) IMPLANT
CHLORAPREP W/TINT 26 (MISCELLANEOUS) ×4 IMPLANT
CLIP APPLIE 5 13 M/L LIGAMAX5 (MISCELLANEOUS) IMPLANT
CLIP APPLIE ROT 10 11.4 M/L (STAPLE) IMPLANT
CLIP LIGATING HEMO O LOK GREEN (MISCELLANEOUS) IMPLANT
CLOTH BEACON ORANGE TIMEOUT ST (SAFETY) ×4 IMPLANT
COVER SURGICAL LIGHT HANDLE (MISCELLANEOUS) ×8 IMPLANT
COVER TIP SHEARS 8 DVNC (MISCELLANEOUS) ×3 IMPLANT
COVER TIP SHEARS 8MM DA VINCI (MISCELLANEOUS) ×4
DERMABOND ADVANCED (GAUZE/BANDAGES/DRESSINGS) ×1
DERMABOND ADVANCED .7 DNX12 (GAUZE/BANDAGES/DRESSINGS) ×3 IMPLANT
DEVICE TROCAR PUNCTURE CLOSURE (ENDOMECHANICALS) IMPLANT
DRAIN CHANNEL 19F RND (DRAIN) IMPLANT
DRAPE ARM DVNC X/XI (DISPOSABLE) ×12 IMPLANT
DRAPE COLUMN DVNC XI (DISPOSABLE) ×3 IMPLANT
DRAPE DA VINCI XI ARM (DISPOSABLE) ×16
DRAPE DA VINCI XI COLUMN (DISPOSABLE) ×4
DRAPE SURG IRRIG POUCH 19X23 (DRAPES) ×4 IMPLANT
DRSG OPSITE POSTOP 4X10 (GAUZE/BANDAGES/DRESSINGS) IMPLANT
DRSG OPSITE POSTOP 4X6 (GAUZE/BANDAGES/DRESSINGS) IMPLANT
DRSG OPSITE POSTOP 4X8 (GAUZE/BANDAGES/DRESSINGS) IMPLANT
DRSG TEGADERM 4X4.75 (GAUZE/BANDAGES/DRESSINGS) ×4 IMPLANT
ELECT REM PT RETURN 15FT ADLT (MISCELLANEOUS) ×4 IMPLANT
ENDOLOOP SUT PDS II  0 18 (SUTURE)
ENDOLOOP SUT PDS II 0 18 (SUTURE) IMPLANT
EVACUATOR SILICONE 100CC (DRAIN) IMPLANT
GAUZE SPONGE 2X2 8PLY STRL LF (GAUZE/BANDAGES/DRESSINGS) ×3 IMPLANT
GAUZE SPONGE 4X4 12PLY STRL (GAUZE/BANDAGES/DRESSINGS) ×4 IMPLANT
GLOVE SURG ENC MOIS LTX SZ7.5 (GLOVE) ×12 IMPLANT
GLOVE SURG ENC TEXT LTX SZ7.5 (GLOVE) ×4 IMPLANT
GLOVE SURG UNDER LTX SZ8 (GLOVE) ×12 IMPLANT
GOWN STRL REUS W/TWL LRG LVL3 (GOWN DISPOSABLE) ×4 IMPLANT
GOWN STRL REUS W/TWL XL LVL3 (GOWN DISPOSABLE) ×20 IMPLANT
GRASPER SUT TROCAR 14GX15 (MISCELLANEOUS) IMPLANT
GUIDEWIRE ANG ZIPWIRE 038X150 (WIRE) IMPLANT
GUIDEWIRE STR DUAL SENSOR (WIRE) IMPLANT
HOLDER FOLEY CATH W/STRAP (MISCELLANEOUS) ×4 IMPLANT
KIT PROCEDURE DA VINCI SI (MISCELLANEOUS) ×4
KIT PROCEDURE DVNC SI (MISCELLANEOUS) ×3 IMPLANT
KIT TURNOVER KIT A (KITS) ×8 IMPLANT
MANIFOLD NEPTUNE II (INSTRUMENTS) ×4 IMPLANT
NEEDLE INSUFFLATION 14GA 120MM (NEEDLE) ×4 IMPLANT
PACK COLON (CUSTOM PROCEDURE TRAY) ×4 IMPLANT
PACK CYSTO (CUSTOM PROCEDURE TRAY) ×4 IMPLANT
PAD POSITIONING PINK XL (MISCELLANEOUS) ×4 IMPLANT
PAD TELFA 2X3 NADH STRL (GAUZE/BANDAGES/DRESSINGS) ×4 IMPLANT
PENCIL SMOKE EVACUATOR (MISCELLANEOUS) IMPLANT
PROTECTOR NERVE ULNAR (MISCELLANEOUS) ×8 IMPLANT
RELOAD STAPLER 3.5X45 BLU DVNC (STAPLE) IMPLANT
RELOAD STAPLER 3.5X60 BLU DVNC (STAPLE) IMPLANT
RELOAD STAPLER 4.3X45 GRN DVNC (STAPLE) ×3 IMPLANT
RELOAD STAPLER 4.3X60 GRN DVNC (STAPLE) ×6 IMPLANT
RTRCTR WOUND ALEXIS 18CM MED (MISCELLANEOUS)
SCISSORS LAP 5X35 DISP (ENDOMECHANICALS) IMPLANT
SEAL CANN UNIV 5-8 DVNC XI (MISCELLANEOUS) ×12 IMPLANT
SEAL XI 5MM-8MM UNIVERSAL (MISCELLANEOUS) ×16
SEALER TISSUE X1 STRG JAW 37MM (SHEATH) ×4 IMPLANT
SEALER VESSEL DA VINCI XI (MISCELLANEOUS) ×4
SEALER VESSEL EXT DVNC XI (MISCELLANEOUS) ×3 IMPLANT
SET IRRIG TUBING LAPAROSCOPIC (IRRIGATION / IRRIGATOR) ×4 IMPLANT
SLEEVE ADV FIXATION 5X100MM (TROCAR) IMPLANT
SOLUTION ELECTROLUBE (MISCELLANEOUS) ×4 IMPLANT
SPONGE GAUZE 2X2 STER 10/PKG (GAUZE/BANDAGES/DRESSINGS) ×1
STAPLER 60 DA VINCI SURE FORM (STAPLE) ×4
STAPLER 60 SUREFORM DVNC (STAPLE) ×3 IMPLANT
STAPLER ECHELON POWER CIR 29 (STAPLE) ×4 IMPLANT
STAPLER ECHELON POWER CIR 31 (STAPLE) IMPLANT
STAPLER RELOAD 3.5X45 BLU DVNC (STAPLE)
STAPLER RELOAD 3.5X45 BLUE (STAPLE)
STAPLER RELOAD 3.5X60 BLU DVNC (STAPLE)
STAPLER RELOAD 3.5X60 BLUE (STAPLE)
STAPLER RELOAD 4.3X45 GREEN (STAPLE) ×4
STAPLER RELOAD 4.3X45 GRN DVNC (STAPLE) ×3
STAPLER RELOAD 4.3X60 GREEN (STAPLE) ×8
STAPLER RELOAD 4.3X60 GRN DVNC (STAPLE) ×6
STOPCOCK 4 WAY LG BORE MALE ST (IV SETS) IMPLANT
SURGILUBE 2OZ TUBE FLIPTOP (MISCELLANEOUS) ×4 IMPLANT
SUT MNCRL AB 4-0 PS2 18 (SUTURE) ×8 IMPLANT
SUT PDS AB 1 CT1 27 (SUTURE) IMPLANT
SUT PDS AB 1 TP1 96 (SUTURE) IMPLANT
SUT PROLENE 0 CT 2 (SUTURE) IMPLANT
SUT PROLENE 2 0 KS (SUTURE) ×4 IMPLANT
SUT PROLENE 2 0 SH DA (SUTURE) IMPLANT
SUT SILK 2 0 (SUTURE)
SUT SILK 2 0 SH CR/8 (SUTURE) IMPLANT
SUT SILK 2-0 18XBRD TIE 12 (SUTURE) IMPLANT
SUT SILK 3 0 (SUTURE) ×4
SUT SILK 3 0 SH CR/8 (SUTURE) ×4 IMPLANT
SUT SILK 3-0 18XBRD TIE 12 (SUTURE) ×3 IMPLANT
SUT V-LOC BARB 180 2/0GR6 GS22 (SUTURE)
SUT VIC AB 3-0 SH 18 (SUTURE) IMPLANT
SUT VIC AB 3-0 SH 27 (SUTURE)
SUT VIC AB 3-0 SH 27XBRD (SUTURE) IMPLANT
SUT VICRYL 0 UR6 27IN ABS (SUTURE) ×4 IMPLANT
SUTURE V-LC BRB 180 2/0GR6GS22 (SUTURE) IMPLANT
SYR 10ML LL (SYRINGE) ×4 IMPLANT
SYS LAPSCP GELPORT 120MM (MISCELLANEOUS)
SYS WOUND ALEXIS 18CM MED (MISCELLANEOUS) ×4
SYSTEM LAPSCP GELPORT 120MM (MISCELLANEOUS) IMPLANT
SYSTEM WOUND ALEXIS 18CM MED (MISCELLANEOUS) ×3 IMPLANT
TAPE CLOTH SURG 4X10 WHT LF (GAUZE/BANDAGES/DRESSINGS) ×4 IMPLANT
TAPE UMBILICAL 1/8 X36 TWILL (MISCELLANEOUS) ×4 IMPLANT
TOWEL OR NON WOVEN STRL DISP B (DISPOSABLE) ×4 IMPLANT
TRAY FOLEY MTR SLVR 16FR STAT (SET/KITS/TRAYS/PACK) ×4 IMPLANT
TROCAR ADV FIXATION 5X100MM (TROCAR) ×4 IMPLANT
TUBING CONNECTING 10 (TUBING) ×16 IMPLANT
TUBING INSUFFLATION 10FT LAP (TUBING) ×4 IMPLANT
TUBING UROLOGY SET (TUBING) IMPLANT

## 2020-10-05 NOTE — Anesthesia Postprocedure Evaluation (Signed)
Anesthesia Post Note  Patient: Daniel Aguirre  Procedure(s) Performed: XI ROBOTIC ASSISTED COLOSTOMY TAKEDOWN WITH LOW ANTERIOR RESECTION, LYSIS OF ADHESIONS (Abdomen) FLEXIBLE SIGMOIDOSCOPY (Rectum) CYSTOSCOPY with FIREFLY INJECTION     Patient location during evaluation: PACU Anesthesia Type: General Level of consciousness: sedated and patient cooperative Pain management: pain level controlled Vital Signs Assessment: post-procedure vital signs reviewed and stable Respiratory status: spontaneous breathing Cardiovascular status: stable Anesthetic complications: no   No notable events documented.  Last Vitals:  Vitals:   10/05/20 1539 10/05/20 1631  BP: 106/72 117/73  Pulse: 75 73  Resp: 14 14  Temp: 36.5 C 36.4 C  SpO2: 98% 99%    Last Pain:  Vitals:   10/05/20 1631  TempSrc: Oral  PainSc:                  Lewie Loron

## 2020-10-05 NOTE — Anesthesia Procedure Notes (Signed)
Procedure Name: Intubation Date/Time: 10/05/2020 7:40 AM Performed by: Pearson Grippe, CRNA Pre-anesthesia Checklist: Patient identified, Emergency Drugs available, Suction available and Patient being monitored Patient Re-evaluated:Patient Re-evaluated prior to induction Oxygen Delivery Method: Circle system utilized Preoxygenation: Pre-oxygenation with 100% oxygen Induction Type: IV induction Ventilation: Mask ventilation without difficulty and Oral airway inserted - appropriate to patient size Laryngoscope Size: Glidescope and 4 Grade View: Grade I Tube type: Oral Tube size: 7.5 mm Number of attempts: 1 Airway Equipment and Method: Stylet, Oral airway and Video-laryngoscopy Placement Confirmation: ETT inserted through vocal cords under direct vision, positive ETCO2 and breath sounds checked- equal and bilateral Secured at: 22 cm Tube secured with: Tape Dental Injury: Teeth and Oropharynx as per pre-operative assessment  Difficulty Due To: Difficulty was unanticipated, Difficult Airway- due to reduced neck mobility and Difficult Airway- due to anterior larynx Comments: DL x1 Mil 2 S Daniel Flanery CRNA, grade III view, Glide #4 x1 S Tag Wurtz CRNA, grade I view, glottis notably anterior, ATOI, +BBS/etCO2.

## 2020-10-05 NOTE — Transfer of Care (Signed)
Immediate Anesthesia Transfer of Care Note  Patient: Daniel Aguirre  Procedure(s) Performed: XI ROBOTIC ASSISTED COLOSTOMY TAKEDOWN WITH LOW ANTERIOR RESECTION, LYSIS OF ADHESIONS (Abdomen) FLEXIBLE SIGMOIDOSCOPY (Rectum) CYSTOSCOPY with FIREFLY INJECTION  Patient Location: PACU  Anesthesia Type:General  Level of Consciousness: awake, alert  and oriented  Airway & Oxygen Therapy: Patient Spontanous Breathing and Patient connected to face mask oxygen  Post-op Assessment: Report given to RN and Post -op Vital signs reviewed and stable  Post vital signs: Reviewed and stable  Last Vitals:  Vitals Value Taken Time  BP 120/71 10/05/20 1234  Temp    Pulse 82 10/05/20 1237  Resp 17 10/05/20 1237  SpO2 95 % 10/05/20 1237  Vitals shown include unvalidated device data.  Last Pain:  Vitals:   10/05/20 0615  TempSrc:   PainSc: 0-No pain         Complications: No notable events documented.

## 2020-10-05 NOTE — Op Note (Signed)
Procedure: Cystoscopy with bilateral ureteral catheterization and injection of firefly.  Preop diagnosis: Colostomy for takedown.  Postop diagnosis: Same.  Surgeon: Dr. Bjorn Pippin.  Anesthesia: General.  Specimen: None.  Drains: Foley catheter.  EBL: None.  Complications: None.  Indications: The patient is a 70 year old male who has a colostomy that is planned for takedown and firefly injection was requested for ureteral identification.  Procedure: He was taken operating room where he was given antibiotic per general surgery.  A general anesthetic was induced.  He was placed in lithotomy position and fitted with PAS hose.  His perineum and genitalia were prepped with Betadine solution was draped in usual sterile fashion.  Cystoscopy was performed with a 21 [place] scope and 30 degree lens.  Examination revealed a normal urethra.  The external sphincter was intact.  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  Examination of bladder revealed mild trabeculation without tumors, stones or inflammation.  Ureteral orifices were unremarkable.  A 5 French open-ended catheter was then used to inject 7.5 mL of firefly solution into the left ureter and collecting system.  A 5 French open-ended catheter was then used to inject 7.5 mL of firefly solution into the right ureter and collecting system.  The cystoscope was removed and a 16 French Foley catheter was inserted.  The balloon was filled with 10 mL of sterile fluid and the cath was placed to drainage.  The procedure was turned over to Dr. Cliffton Asters.  There were no complications during my portion of the procedure.

## 2020-10-05 NOTE — Consult Note (Signed)
I was asked to do cystoscopy with firefly injection to aid Dr. Cliffton Asters in ureteral identification. He has a history of stones but no other GU problems.  I have reviewed the pertinent imaging, labs and notes.  I have reviewed the risks of the procedure including bleeding, infection and injury to the urinary structures.    He is agreeable to proceed.

## 2020-10-05 NOTE — Op Note (Signed)
PATIENT: Daniel Aguirre  70 y.o. male  Patient Care Team: Daisy Floro, MD as PCP - General (Family Medicine) Rollene Rotunda, MD as PCP - Cardiology (Cardiology)  PREOP DIAGNOSIS: COLOSOTMY STATUS  POSTOP DIAGNOSIS: COLOSOTMY STATUS  PROCEDURE:  Robotic low anterior resection with takedown of end colostomy; stapled colorectal anastomosis Takedown of splenic flexure Robotic lysis of adhesions x 120 minutes Bilateral transversus abdominus plane blocks Flexible sigmoidoscopy (to assess anastomosis)  SURGEON: Stephanie Coup. Lael Wetherbee, MD  ASSISTANT: Romie Levee, MD  ANESTHESIA: General endotracheal  EBL: 50 mL Total I/O In: 2659.3 [I.V.:2500; IV Piggyback:159.3] Out: 550 [Urine:500; Blood:50]  DRAINS: None  SPECIMEN:  Remnant sigmoid colon and proximal most rectum Colostomy  COUNTS: Sponge, needle and instrument counts were reported correct x2  FINDINGS:  Adhesions throughout left side of abdomen including small bowel to the domino wall, small bowel to small bowel, and where the descending colon had previously been mobilized.  There were also adhesions of the omentum to the descending colon.  All adhesiolysis was taken care of with both combination of robotic and laparoscopic lysis of adhesions.  There was a remnant sigmoid and some thickening of the proximalmost rectum.  Therefore, decision was made to proceed with a low anterior resection in order to have a healthy distal staple line.  We are then able to reverse the colostomy and created a 29 mm EEA colorectal anastomosis at 12 cm from the anal verge by flexible sigmoidoscopy.  This is well perfused, tension-free, airtight, and hemostatic.  NARRATIVE: Informed consent was verified. He was taken to the operating room, placed supine on the operating table and SCD's were applied. General endotracheal anesthesia was induced without difficulty. The patient was then positioned in the lithotomy position with Allen stirrups  and arms tucked.  He was secured to the operating table.  Pressure points were padded.  Hair on the abdomen was then clipped.  Dr. Annabell Howells then scrubbed for his portion of the procedure.  Please refer to his note for details regarding this portion of the operation.  The abdomen was then prepped and draped in the standard sterile fashion. Surgical timeout was called indicating the correct patient, procedure, positioning and need for preoperative antibiotics.   An OG tube was placed by anesthesia and confirmed to be to suction.  At Palmer's point, a stab incision was created and the Veress needle was introduced into the peritoneal cavity on the first attempt.  Intraperitoneal location was confirmed by the aspiration and saline drop test.  Pneumoperitoneum was established to a maximum pressure of 15 mmHg using CO2.  Following this, the abdomen was marked for planned trocar sites.  Just to the right and cephalad to the umbilicus, an 8 mm incision was created and an 8 mm blunt tipped robotic trocar was cautiously placed into the peritoneal cavity.  The laparoscope was inserted and demonstrated no evidence of trocar site nor Veress needle site complications.  The Veress needle was removed.  Bilateral transversus abdominis plane blocks were then created using a dilute mixture of Exparel with Marcaine.  3 additional 8 mm robotic trochars were placed under direct visualization roughly in a line extending from the right ASIS towards the left upper quadrant. An additional 5 mm assist port was placed in the right lateral abdomen under direct visualization.  The abdomen was surveyed and there were adhesions throughout the left side of the abdomen including small bowel to the abdominal wall and omentum.  He was positioned in Trendelenburg with some  left side up. Small bowel is also adherent in the pelvis. The robot was then docked and I went to the console.   We began with a adhesiolysis.  This was done using the robot with  sharp dissection.  Small bowel was carefully taken down from the abdominal wall.  The omentum was also taken down from abdominal wall sharply.  We then directed our attention at the pelvis.  Small bowel adhesions were carefully freed from the pelvis.  The Prolene sutures were identified.  The remnant rectosigmoid colon was identified.  There were fairly dense adhesions to the staple line.  These were lysed sharply.  The small bowel was all then inspected and noted to be free of any deserosalizations.  There is some thickening of the sigmoidal stump/rectosigmoid colon at the staple line.  This extended onto the proximalmost aspect of the rectum.  Therefore, we knew we would be doing a low anterior resection.  The rectosigmoid colon was retracted to the right.  The peritoneum overlying the proximal rectum was incised.  We are able to identify the left ureter and protected this free of injury.  The gonadal vessels were also notified and protected free of injury.  The remnant sigmoid and rectosigmoid colon were fully mobilized.  The associated mesorectum was ligated using the vessel sealer, again taking care to steer clear of the left and right ureters.  Attention was then directed at the colostomy.  The colon up to the abdominal wall was carefully mobilized.  There were omental attachments that were lysed.  The colon was inspected and noted to be free of injury.  We then turned our attention to taking down the colostomy.  The robot was undocked and I scrubbed back in.  The colostomy was circumferentially incised at the mucocutaneous junction.  The colostomy was able to be mobilized out of the subcutaneous tissue and delivered into the operating field.  Approximately 4 cm proximal to the os, there is healthy appearing well perfused colon.  This is supple.  The associated mesentery had been cleared out to this level.  A pursestring clamp was applied.  A 2-0 Prolene on a Keith needle was passed.  The remnant colostomy  was excised and passed off the specimen.  EEA sizers were passed and a 20 mm EEA selected.  Belt loops consisting of 3-0 silk suture were placed on the pursestring suture line.  The EEA anvil was placed and the pursestring was then tied.  A small amount of mesenteric fat was cleared at the location of the planned anastomosis.  This colon is well perfused in appearance and there is a palpable pulse in the mesentery.  This is placed back into the abdomen.  An Alexis wound protector was placed at the colostomy takedown site and a cap is placed over this.  Pneumoperitoneum is reestablished.  The robot is then ready docked.  A 12 mm trocar had been placed through the wound protector.  I went back to the console.  Attention was first directed at the rectosigmoid colon.  The 60 mm green load robotic stapler was placed into the abdomen and the proximal rectum divided using this.  The staple line is intact and healthy in appearance.  Through the colostomy site the rectosigmoid colon is removed and passed off the specimen.  The remnant colon was then fully mobilized by taking down the splenic flexure.  The descending colon was retracted medially.  All attachments were freed from the Donivan Thammavong line of Toldt as  well as the retroperitoneum.  The splenic flexure was approached and taken down.  He is then repositioned in reverse Trendelenburg.  It was rather difficult to approach the splenic flexure from a medial approach.  Therefore, the robot was undocked and I completed this portion of the operation laparoscopically. I scrubbed back in.  Using an Enseal device, the omentum was freed from the descending colon and at the level of splenic flexure.  The splenic flexure was then fully released from this approach as well.  At this point, the entire flexure has been taken down.  The anvil reaches into the pelvis without any tension.  It remains in that location.   I then went below to pass the stapler.  My partner remained above.   EEA sizers were cautiously passed trans-anally under direct visualization.  The stapler was passed and the spike deployed just anterior to the staple line.  The components were then mated.  Orientation was confirmed such that there is no twisting of the colon nor small bowel underneath the mesenteric defect. Care was taken to ensure no other structures were incorporated within this either.  The stapler was then closed, held, and fired. This was then removed. The donuts were inspected and noted to be both full and complete.  The colon proximal to the anastomosis was then gently occluded. The pelvis was filled with sterile irrigation. Under direct visualization, I passed a flexible sigmoidoscope.  The anastomosis was under water.  With good distention of the anastomosis there was no air leak. The anastomosis pink in appearance.  This is located at 12 cm from the anal verge by flexible sigmoidoscopy.  It is hemostatic.  Additionally, looking from above, there is no tension on the colon or mesentery.  Sigmoidoscope was withdrawn.  Irrigation was evacuated from the pelvis.  The abdomen and pelvis are surveyed and noted to be completely hemostatic without any apparent injury.  Under direct visualization, all trochars are removed.  The Alexis wound protector was removed.    I scrubbed back in.  Attention then directed at closing the colostomy site.  The fascia was closed longitudinally using 2 running #1 PDS sutures tied centrally.  A pursestring stitches creating a 0 Vicryl at this location to cinched down the skin.  A moist 4 x 4 was placed.  The skin of all port sites was closed with a 4-0 Monocryl subcuticular suture.  All sponge, needle, and instrument counts had been reported correct x2. Dermabond was placed over all incisions.  A dressing consisting of 4 x 4's, and ABD was placed over the colostomy takedown site.   He is taken out of lithotomy, awakened from anesthesia, extubated, and transferred to a stretcher  for transport to PACU in satisfactory condition having tolerated the procedure well.

## 2020-10-05 NOTE — H&P (Signed)
CC: Follow-up after diverticulitis  HPI: Daniel Aguirre is a very pleasant 70 y.o. male with hx of CKD III, HTN, HLD, aortic insuff, afib (on Eliquis) whom was admitted 01/23/20 with 4d hx of LLQ cramps and some diarrhea. Reports similar attack just after Thanksgiving that kept him out of work for 2 days - fever/chills and nausea. Thought it was a GI bug. This go-round has had 4d hx of crampy abdominal pain, diarrhea and minimal nausea. No emesis. No radiation. Nothing makes it better/worse  CT scan demonstrated sigmoid diverticulitis and abscess with the abscess being more central in the small bowel mesentery.  He was found to have a reactive type ileus related to this abscess.  He is admitted to the hospital and placed on IV antibiotics.  This abscess was not amenable to drainage.  He improved gradually over the coming days.  He is ultimately able to be discharged home on antibiotics.  Since that time, he has been doing well.  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  He is a retired Education officer, museum.  He has been doing well at home.  He denies nausea or vomiting.  He is having regular bowel movements.  He does have a history of colon polyps he reports having had 1011 removed at his first colonoscopy but other colonoscopies has since been okay.  He states he is due for colonoscopy this year.  He normally follows with [facility].  INTERVAL HX He presented to the hospital in March, 03/20/20 with new onset of abdominal pain.  He is found to have new pneumoperitoneum.  There are some thickened loops of small bowel in the low midline as well.  He is admitted and started on antibiotics but did not adequately improved.  He is taken the operating room by one of my partners, Dr. Brantley Stage, 03/23/20 and underwent excellent laparotomy, sigmoid colectomy, and Hartman's type procedure with end colostomy.  He is admitted postoperatively and recovered  reasonably well.  He was ultimately discharged home.  He followed up with Dr. Brantley Stage and was referred to see me for consideration of colostomy reversal.  He has been doing well.  He has been back to work now for 2 months.  He had a colonoscopy completed approximately 2 weeks ago Dr. Kathi Der God and he reports there are a few small polyps removed but he was told these are benign.  We are requesting a copy of this report.  His colostomy is working well.  He denies any abdominal pain.  He is a scant amount of drainage from his wound that has healed by secondary intention.  He denies fever/chills/nausea/vomiting.  He denies any changes his health or health hx since we met aside from having had COVID a month ago - recovered without issue. Off his blood thinner, tolerated his bowel prep with good result.   PMH: CKD III, HTN, HLD, aortic insuff, afib (on Eliquis)  PSH: Laparoscopic cholecystectomy many years ago  FHx: He reports his mother had colorectal cancer. Denies any known family history of breast, endometrial or ovarian cancer  Social Hx: He denies the use of tobacco/alcohol/drugs. 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  Past Medical History:  Diagnosis Date   Chronic kidney disease with active medical management without dialysis, stage 3 (moderate) ([place]) 12/20/2017   CKD (chronic kidney disease), stage III ([place])  Diastolic dysfunction    a. 09/2015 Echo: EF 65-70%, no rwma, Gr1 DD, Mod AI. Asc AO 73m. Mildly dil Asc Ao.  LA 48m   Diverticulitis with perforation and abscess s/p Hartmann sigmoid colectomy/colostomy 03/23/2020 01/23/2020   Dupuytren contracture 06/09/2018   Dyslipidemia    Hypertension    Kidney stones    Moderate aortic insufficiency    a. 09/2015 Echo: Mod AI.   Right bundle branch block    Testicular hypofunction     Past Surgical History:  Procedure Laterality Date   CHOLECYSTECTOMY     COLOSTOMY N/A  03/23/2020   Procedure: COLOSTOMY;  Surgeon: CoErroll LunaMD;  Location: [facility];  Service: General;  Laterality: N/A;   LAPAROTOMY N/A 03/23/2020   Procedure: EXPLORATORY LAPAROTOMY;  Surgeon: CoErroll LunaMD;  Location: [facility];  Service: General;  Laterality: N/A;   MOHS SURGERY     PARTIAL COLECTOMY N/A 03/23/2020   Procedure: PARTIAL COLECTOMY;  Surgeon: CoErroll LunaMD;  Location: [facility];  Service: General;  Laterality: N/A;    Family History  Problem Relation Age of Onset   Colon cancer Mother 7735 AAA (abdominal aortic aneurysm) Father 7544 Stroke Father    Heart attack Brother 7265     presumed MI - SCD   Stroke Brother [phone]  Social:  reports that he has never smoked. He has never used smokeless tobacco. He reports current alcohol use of about 2.0 standard drinks per week. He reports that he does not use drugs.  Allergies:  Allergies  Allergen Reactions   Amlodipine Palpitations   Augmentin [Amoxicillin-Pot Clavulanate] Rash    Rash developed in 2022- Patient initially tolerated ~ 4 weeks of therapy. When he re-started augmentin at a later date, he developed a rash about 1 week into therapy. No shortness of breath or swelling.   Lisinopril Other (See Comments)    Elevated  creatinine     Medications: I have reviewed the patient's current medications.  Results for orders placed or performed during the hospital encounter of 10/05/20 (from the past 48 hour(s))  Protime-INR     Status: None   Collection Time: 10/05/20  6:25 AM  Result Value Ref Range   Prothrombin Time 13.0 11.4 - 15.2 seconds   INR 1.0 0.8 - 1.2    Comment: (NOTE) INR goal varies based on device and disease states. Performed at [facility] [place] [id number] APTT     Status: None   Collection Time: 10/05/20  6:25 AM  Result Value Ref Range   aPTT 35 24 - 36 seconds    Comment: Performed at [facility]  [place] [id number]  No results found.  ROS - all of the below systems have been reviewed with the patient and positives are indicated with bold text General: chills, fever or night sweats Eyes: blurry vision or double vision ENT: epistaxis or sore throat Allergy/Immunology: itchy/watery eyes or nasal congestion Hematologic/Lymphatic: bleeding problems, blood clots or swollen lymph nodes Endocrine: temperature intolerance or unexpected weight changes Breast: new or changing breast lumps or nipple discharge Resp: cough, shortness of breath, or wheezing CV: chest pain or dyspnea on exertion GI: as per HPI GU: dysuria, trouble voiding, or hematuria MSK: joint pain or joint stiffness Neuro: TIA or stroke symptoms Derm: pruritus and skin lesion changes Psych: anxiety and depression  PE Blood pressure  120/80, pulse 74, temperature 98.2 F (36.8 C), temperature source Oral, resp. rate 16, height _0  (1.727 m), weight 84.5 kg, SpO2 98 %. Constitutional: NAD; conversant; wearing mask Eyes: Moist conjunctiva; no lid lag; anicteric Lungs: Normal respiratory effort CV: RRR GI: Abd soft, NT/ND MSK: Normal range of motion of extremities; no clubbing/cyanosis Psychiatric: Appropriate affect; alert and oriented x3  Results for orders placed or performed during the hospital encounter of 10/05/20 (from the past 48 hour(s))  Protime-INR     Status: None   Collection Time: 10/05/20  6:25 AM  Result Value Ref Range   Prothrombin Time 13.0 11.4 - 15.2 seconds   INR 1.0 0.8 - 1.2    Comment: (NOTE) INR goal varies based on device and disease states. Performed at [facility], [place] [address], [place], [place] [address]   APTT     Status: None   Collection Time: 10/05/20  6:25 AM  Result Value Ref Range   aPTT 35 24 - 36 seconds    Comment: Performed at [facility], [place] [address] [place], [place] [address]    No results found.   Daniel Aguirre  is a very pleasant [phone] CKD III, HTN, HLD, aortic insuff, afib (on Eliquis) - recently discharged following bout of complicated diverticulitis with central mesenteric abscess, not amenable to percutaneous drainage - ultimately presented with acute worsening pain, 03/20/20 and was taken for exlap/Hartmann's 3/4 after not improving with abx by Dr. Brantley Stage  -Cardiac clearance obtained from Dr. Percival Spanish -Colonoscopy and GGE are clear -He remains highly motivated to have his colostomy reversed  -The anatomy and physiology of the GI tract was reviewed with the patient. -We have discussed various different treatment options going forward including surgery vs life with ostomy. He would like this reversed -We therefore discussed robotic assisted colostomy takedown; scenarios where an open approach may be necessary; possible partial colectomy if remnant sigmoid; flexible sigmoidoscopy; ureteral ICG/firefly ([facility]) -The planned procedures, material risks (including, but not limited to, pain, bleeding, infection, scarring, need for blood transfusion, damage to surrounding structures- blood vessels/nerves/viscus/organs, damage to ureter, urine leak, leak from anastomosis, need for additional procedures, sexual dysfunction, need for another stoma which may be permanent, worsening of pre-existing medical conditions, hernia, recurrence, pneumonia, heart attack, stroke, death) benefits and alternatives to surgery were discussed at length. The patient's and his wife's questions were answered to their satisfaction, they voiced understanding and elected to proceed with surgery. Additionally, we discussed typical postoperative expectations and the recovery process.  Nadeen Landau, MD [facility] Use [email or web address] to contact on call provider

## 2020-10-06 ENCOUNTER — Encounter (HOSPITAL_COMMUNITY): Payer: Self-pay | Admitting: Surgery

## 2020-10-06 LAB — BASIC METABOLIC PANEL
Anion gap: 8 (ref 5–15)
BUN: 24 mg/dL — ABNORMAL HIGH (ref 8–23)
CO2: 24 mmol/L (ref 22–32)
Calcium: 8.4 mg/dL — ABNORMAL LOW (ref 8.9–10.3)
Chloride: 104 mmol/L (ref 98–111)
Creatinine, Ser: 1.53 mg/dL — ABNORMAL HIGH (ref 0.61–1.24)
GFR, Estimated: 49 mL/min — ABNORMAL LOW (ref >60)
Glucose, Bld: 127 mg/dL — ABNORMAL HIGH (ref 70–99)
Potassium: 4.8 mmol/L (ref 3.5–5.1)
Sodium: 136 mmol/L (ref 135–145)

## 2020-10-06 LAB — CBC
HCT: 40.5 % (ref 39.0–52.0)
Hemoglobin: 14 g/dL (ref 13.0–17.0)
MCH: 30.8 pg (ref 26.0–34.0)
MCHC: 34.6 g/dL (ref 30.0–36.0)
MCV: 89.2 fL (ref 80.0–100.0)
Platelets: 205 10*3/uL (ref 150–400)
RBC: 4.54 MIL/uL (ref 4.22–5.81)
RDW: 13.9 % (ref 11.5–15.5)
WBC: 10.9 10*3/uL — ABNORMAL HIGH (ref 4.0–10.5)
nRBC: 0 % (ref 0.0–0.2)

## 2020-10-06 MED ORDER — POLYVINYL ALCOHOL 1.4 % OP SOLN
1.0000 [drp] | OPHTHALMIC | Status: DC | PRN
  Administered 2020-10-06: 1 [drp] via OPHTHALMIC
  Filled 2020-10-06: qty 15

## 2020-10-06 NOTE — Progress Notes (Signed)
PT Cancellation Note  Patient Details Name: JADIS MIKA MRN: [id number] DOB: 1950/05/28   Cancelled Treatment:    Reason Eval/Treat Not Completed: PT screened, no needs identified, will sign off, ambulating without needs from PT.   Rada Hay 10/06/2020, 1:40 PM Blanchard Kelch PT Acute Rehabilitation Services Pager [phone] Office [phone]

## 2020-10-06 NOTE — Progress Notes (Signed)
1 Day Post-Op Robotic Colostomy Reversal Subjective: Min pain overnight, ambulating, tolerating fulls, no nausea, +BM with blood  Objective: Vital signs in last 24 hours: Temp:  [97.5 F (36.4 C)-98.2 F (36.8 C)] 98.2 F (36.8 C) (09/16 2125) Pulse Rate:  [69-108] 108 (09/16 2125) Resp:  [11-18] 18 (09/16 2125) BP: (98-120)/(64-88) 110/88 (09/16 2125) SpO2:  [93 %-99 %] 98 % (09/16 2125) Weight:  [86.1 kg] 86.1 kg (09/17 0500)   Intake/Output from previous day: 09/16 0701 - 09/17 0700 In: 4853.3 [P.O.:1069; I.V.:3625; IV Piggyback:159.3] Out: 2790 [Urine:2740; Blood:50] Intake/Output this shift: No intake/output data recorded.   General appearance: alert and cooperative GI: normal findings: soft, nondistended  Incision: dressing saturated  Lab Results:  Recent Labs    10/06/20 0421  WBC 10.9*  HGB 14.0  HCT 40.5  PLT 205   BMET Recent Labs    10/06/20 0421  NA 136  K 4.8  CL 104  CO2 24  GLUCOSE 127*  BUN 24*  CREATININE 1.53*  CALCIUM 8.4*   PT/INR Recent Labs    10/05/20 0625  LABPROT 13.0  INR 1.0   ABG No results for input(s): PHART, HCO3 in the last 72 hours.  Invalid input(s): PCO2, PO2  MEDS, Scheduled  acetaminophen  1,000 mg Oral Q6H   alvimopan  12 mg Oral BID   diltiazem  240 mg Oral Daily   feeding supplement  237 mL Oral BID BM   heparin injection (subcutaneous)  5,000 Units Subcutaneous Q8H   metoprolol tartrate  25 mg Oral BID   tamsulosin  0.4 mg Oral Daily    Studies/Results: No results found.  Assessment: s/p Procedure(s): XI ROBOTIC ASSISTED COLOSTOMY TAKEDOWN WITH LOW ANTERIOR RESECTION, LYSIS OF ADHESIONS FLEXIBLE SIGMOIDOSCOPY CYSTOSCOPY with FIREFLY INJECTION Patient Active Problem List   Diagnosis Date Noted   S/P colostomy takedown 10/05/2020   Aortic valve disorder 03/31/2020   Daytime somnolence 03/31/2020   Heart murmur 03/31/2020   Testicular hypofunction 03/31/2020   Tinnitus 03/31/2020   Vitamin  D deficiency 03/31/2020   Colostomy in place [facility]) 03/31/2020   Diverticulitis with perforation and abscess s/p Hartmann sigmoid colectomy/colostomy 03/23/2020 01/23/2020   Intra-abdominal abscess (HCC) 01/23/2020   Educated about COVID-19 virus infection 09/28/2019   Elevated troponin 09/28/2019   BPH (benign prostatic hyperplasia) 10/06/2018   Kidney stones, calcium oxalate 10/02/2018   Dupuytren contracture 06/09/2018   Trigger ring finger of right hand 06/09/2018   Nonrheumatic aortic valve insufficiency 01/07/2018   Abnormal EKG 01/07/2018   Chest pressure    Atrial fibrillation (HCC) 12/20/2017   Essential hypertension 12/20/2017   HLD (hyperlipidemia) 12/20/2017   Stage 3a chronic kidney disease (HCC) 12/20/2017    Expected post op course  Plan: d/c foley Advance diet to soft foods SL IVF's Ambulate in hall   LOS: 1 day     .Vanita Panda, MD [facility], [place]    10/06/2020 9:06 AM

## 2020-10-07 NOTE — Discharge Instructions (Signed)
ABDOMINAL SURGERY: POST OP INSTRUCTIONS  DIET: Follow a light bland diet the first 24 hours after arrival home, such as soup, liquids, crackers, etc.  Be sure to include lots of fluids daily.  Avoid fast food or heavy meals as your are more likely to get nauseated.  Do not eat any uncooked fruits or vegetables for the next 2 weeks as your colon heals. Take your usually prescribed home medications unless otherwise directed. PAIN CONTROL: Pain is best controlled by a usual combination of three different methods TOGETHER: Ice/Heat Over the counter pain medication Prescription pain medication Most patients will experience some swelling and bruising around the incisions.  Ice packs or heating pads (30-60 minutes up to 6 times a day) will help. Use ice for the first few days to help decrease swelling and bruising, then switch to heat to help relax tight/sore spots and speed recovery.  Some people prefer to use ice alone, heat alone, alternating between ice & heat.  Experiment to what works for you.  Swelling and bruising can take several weeks to resolve.   It is helpful to take an over-the-counter pain medication regularly for the first few weeks.  Choose one of the following that works best for you: Naproxen (Aleve, etc)  Two 220mg  tabs twice a day Ibuprofen (Advil, etc) Three 200mg  tabs four times a day (every meal & bedtime) Acetaminophen (Tylenol, etc) 500-650mg  four times a day (every meal & bedtime) A  prescription for pain medication (such as oxycodone, hydrocodone, etc) may be given to you upon discharge.  Take your pain medication as prescribed.  If you are having problems/concerns with the prescription medicine (does not control pain, nausea, vomiting, rash, itching, etc), please call [phone] to see if we need to switch you to a different pain medicine that will work better for you and/or control your side effect better. If you need a refill on your pain medication, please contact your  pharmacy.  They will contact our office to request authorization. Prescriptions will not be filled after 5 pm or on week-ends. Avoid getting constipated.  Between the surgery and the pain medications, it is common to experience some constipation.  Increasing fluid intake and taking a fiber supplement (such as Metamucil, Citrucel, FiberCon, MiraLax, etc) 1-2 times a day regularly will usually help prevent this problem from occurring.  A mild laxative (prune juice, Milk of Magnesia, MiraLax, etc) should be taken according to package directions if there are no bowel movements after 48 hours.   Watch out for diarrhea.  If you have many loose bowel movements, simplify your diet to bland foods & liquids for a few days.  Stop any stool softeners and decrease your fiber supplement.  Switching to mild anti-diarrheal medications (Kayopectate, Pepto Bismol) can help.  If this worsens or does not improve, please call us. Wash / shower every day.  You may shower over the incision / wound.  Avoid baths until the skin is fully healed.  Continue to shower over incision(s) after the dressing is off. Pack wound wet to dry daily ACTIVITIES as tolerated:   You may resume regular (light) daily activities beginning the next day--such as daily self-care, walking, climbing stairs--gradually increasing activities as tolerated.  If you can walk 30 minutes without difficulty, it is safe to try more intense activity such as jogging, treadmill, bicycling, low-impact aerobics, swimming, etc. Save the most intensive and strenuous activity for last such as sit-ups, heavy lifting, contact sports, etc  Refrain from  any heavy lifting or straining until you are off narcotics for pain control.   DO NOT PUSH THROUGH PAIN.  Let pain be your guide: If it hurts to do something, don't do it.  Pain is your body warning you to avoid that activity for another week until the pain goes down. You may drive when you are no longer taking prescription pain  medication, you can comfortably wear a seatbelt, and you can safely maneuver your car and apply brakes. You may have sexual intercourse when it is comfortable.  FOLLOW UP in our office Please call [facility] at [phone] to set up an appointment to see your surgeon in the office for a follow-up appointment approximately 1-2 weeks after your surgery. Make sure that you call for this appointment the day you arrive home to insure a convenient appointment time. 10. IF YOU HAVE DISABILITY OR FAMILY LEAVE FORMS, BRING THEM TO THE OFFICE FOR PROCESSING.  DO NOT GIVE THEM TO YOUR DOCTOR.   WHEN TO CALL us [phone]: Poor pain control Reactions / problems with new medications (rash/itching, nausea, etc)  Fever over 101.5 F (38.5 C) Inability to urinate Nausea and/or vomiting Worsening swelling or bruising Continued bleeding from incision. Increased pain, redness, or drainage from the incision  The clinic staff is available to answer your questions during regular business hours (8:30am-5pm).  Please don't hesitate to call and ask to speak to one of our nurses for clinical concerns.   A surgeon from [facility] is always on call at the hospitals   If you have a medical emergency, go to the nearest emergency room or call 911.    [facility] [address], [place], [place]  [address] ? MAIN: [phone] ? TOLL FREE: [phone] ? FAX [phone] [email or web address]

## 2020-10-07 NOTE — Progress Notes (Signed)
Assessment unchanged. Pt and wife verbalized understanding of dc instructions through teach back including medications, follow up care and wound care. Supplies provided for home to facilitate wet to dry dressings to be done daily. Discharged via wc to front entrance accompanied by NT.

## 2020-10-07 NOTE — Plan of Care (Signed)

## 2020-10-07 NOTE — Discharge Summary (Signed)
Physician Discharge Summary  Patient ID: Daniel Aguirre MRN: [id number] DOB/AGE: [birth year] 70 y.o.  Admit date: 10/05/2020 Discharge date: 10/07/2020  Admission Diagnoses: colostomy in place  Discharge Diagnoses:  Active Problems:   S/P colostomy takedown   Discharged Condition: good  Hospital Course: Patient was admitted to the med surg floor after surgery.  Diet was advanced as tolerated.  Patient began to have bowel function on postop day 1.  By postop day 2, he was tolerating a solid diet, pain was controlled with oral medications.  He was urinating without difficulty and ambulating without assistance.  Patient was felt to be in stable condition for discharge to home.   Consults: None  Significant Diagnostic Studies: labs: cbc, bmet  Treatments: IV hydration, analgesia: acetaminophen, and surgery: robotic assisted colostomy reversal  Discharge Exam: Blood pressure 117/74, pulse 68, temperature 98.9 F (37.2 C), temperature source Oral, resp. rate 17, height 5\' 8"  (1.727 m), weight 86.1 kg, SpO2 96 %. General appearance: alert and cooperative GI: normal findings: soft, non-tender Incision/Wound: open and clean, wound repacked  Disposition: Discharge disposition: 01-Home or Self Care        Allergies as of 10/07/2020       Reactions   Amlodipine Palpitations   Augmentin [amoxicillin-pot Clavulanate] Rash   Rash developed in 2022- Patient initially tolerated ~ 4 weeks of therapy. When he re-started augmentin at a later date, he developed a rash about 1 week into therapy. No shortness of breath or swelling.   Lisinopril Other (See Comments)   Elevated  creatinine         Medication List     TAKE these medications    acetaminophen 650 MG CR tablet Commonly known as: TYLENOL Take 650 mg by mouth at bedtime as needed for pain.   acetaminophen 325 MG tablet Commonly known as: TYLENOL Take 2 tablets (650 mg total) by mouth every 6 (six) hours as needed for  mild pain or fever.   diltiazem 240 MG 24 hr capsule Commonly known as: CARDIZEM CD Take 1 capsule (240 mg total) by mouth daily.   Eliquis 5 MG Tabs tablet Generic drug: apixaban TAKE 1 TABLET BY MOUTH TWICE A DAY What changed: how much to take   EPINEPHrine 0.3 mg/0.3 mL Soaj injection Commonly known as: EPI-PEN Inject 0.3 mLs into the muscle once as needed for anaphylaxis.   fluticasone 0.005 % ointment Commonly known as: CUTIVATE Apply 1 application topically 2 (two) times daily as needed (rash/dry skin).   methocarbamol 500 MG tablet Commonly known as: Robaxin Take 1 tablet (500 mg total) by mouth every 6 (six) hours as needed for muscle spasms (pain).   metoprolol tartrate 25 MG tablet Commonly known as: LOPRESSOR Take 25 mg by mouth 2 (two) times daily.   psyllium 58.6 % powder Commonly known as: METAMUCIL Take 2 packets by mouth daily.   tamsulosin 0.4 MG Caps capsule Commonly known as: FLOMAX Take 0.4 mg by mouth daily.   testosterone 50 MG/5GM (1%) Gel Commonly known as: ANDROGEL Place 2.5-5 g onto the skin daily. 5mg  on Monday Wednesday Friday 2.5mg  on Tuesday thursdsay Saturday sunday   traMADol 50 MG tablet Commonly known as: Ultram Take 1 tablet (50 mg total) by mouth every 6 (six) hours as needed for up to 5 days (postop pain not controlled with tylenol first).   Vitamin D3 125 MCG (5000 UT) Caps Take 5,000 Units by mouth daily.        Follow-up Information  Andria Meuse, MD. Schedule an appointment as soon as possible for a visit in 2 week(s).   Specialties: General Surgery, Colon and Rectal Surgery Why: 2-3 weeks for postoperative appointment Contact information: [address] [place] [address] [phone]                 Signed: Vanita Panda 10/07/2020, 8:57 AM

## 2020-10-08 LAB — SURGICAL PATHOLOGY

## 2021-04-15 DIAGNOSIS — K005 Hereditary disturbances in tooth structure, not elsewhere classified: Secondary | ICD-10-CM | POA: Insufficient documentation

## 2021-04-15 NOTE — Progress Notes (Signed)
?  ?Cardiology Office Note ? ? ?Date:  04/16/2021  ? ?ID:  Daniel Aguirre, DOB 29-Apr-1950, MRN [id number] ? ?PCP:  Lawerance Cruel, MD  ?Cardiologist:   Minus Breeding, MD ? ? ?Chief Complaint  ?Patient presents with  ? Atrial Fibrillation  ? ? ?  ?History of Present Illness: ?Daniel Aguirre is a 71 y.o. male who presents for evaluation of atrial fib.  He had this initially in 2019.  He was hospitalized at that time.  He had some elevated troponin but a follow-up negative perfusion study.  He did have some probably moderate aortic insufficiency with an eccentric jet.  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  He has some renal insufficiency. ? ?Since I last saw him he has done well.  He had reversal of his colostomy. The patient denies any new symptoms such as chest discomfort, neck or arm discomfort. There has been no new shortness of breath, PND or orthopnea. There have been no reported palpitations, presyncope or syncope.   He is doing some physical activity.  He still works at [employer].   ? ? ?Past Medical History:  ?Diagnosis Date  ? Chronic kidney disease with active medical management without dialysis, stage 3 (moderate) ([place]) 12/20/2017  ? CKD (chronic kidney disease), stage III ([place])   ? Diastolic dysfunction   ? a. 09/2015 Echo: EF 65-70%, no rwma, Gr1 DD, Mod AI. Asc AO 18mm. Mildly dil Asc Ao.  LA 54mm.  ? 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 Dupuytren contracture 06/09/2018  ? Dyslipidemia   ? Hypertension   ? Kidney stones   ? Moderate aortic insufficiency   ? a. 09/2015 Echo: Mod AI.  ? Right bundle branch block   ? Testicular hypofunction   ? ? ?Past Surgical History:  ?Procedure Laterality Date  ? CHOLECYSTECTOMY    ? COLOSTOMY N/A 03/23/2020  ? Procedure: COLOSTOMY;  Surgeon: Erroll Luna, MD;  Location: [facility];  Service: General;  Laterality: N/A;  ? FLEXIBLE SIGMOIDOSCOPY  N/A 10/05/2020  ? Procedure: FLEXIBLE SIGMOIDOSCOPY;  Surgeon: Ileana Roup, MD;  Location: [facility];  Service: General;  Laterality: N/A;  ? LAPAROTOMY N/A 03/23/2020  ? Procedure: EXPLORATORY LAPAROTOMY;  Surgeon: Erroll Luna, MD;  Location: [facility];  Service: General;  Laterality: N/A;  ? MOHS SURGERY    ? PARTIAL COLECTOMY N/A 03/23/2020  ? Procedure: PARTIAL COLECTOMY;  Surgeon: Erroll Luna, MD;  Location: [facility];  Service: General;  Laterality: N/A;  ? XI ROBOTIC ASSISTED COLOSTOMY TAKEDOWN N/A 10/05/2020  ? Procedure: XI ROBOTIC ASSISTED COLOSTOMY TAKEDOWN WITH LOW ANTERIOR RESECTION, LYSIS OF ADHESIONS;  Surgeon: Ileana Roup, MD;  Location: [facility];  Service: General;  Laterality: N/A;  ? ? ? ?Current Outpatient Medications  ?Medication Sig Dispense Refill  ? acetaminophen (TYLENOL) 325 MG tablet Take 2 tablets (650 mg total) by mouth every 6 (six) hours as needed for mild pain or fever.    ? Cholecalciferol (VITAMIN D3) 125 MCG (5000 UT) CAPS Take 5,000 Units by mouth daily.    ? diltiazem (CARDIZEM CD) 240 MG 24 hr capsule Take 1 capsule (240 mg total) by mouth daily. 90 capsule 2  ? ELIQUIS 5 MG TABS tablet TAKE 1 TABLET BY MOUTH TWICE A DAY (Patient taking differently: Take 5 mg by mouth 2 (two) times daily.) 60 tablet 2  ? EPINEPHrine 0.3 mg/0.3 mL IJ SOAJ injection Inject  0.3 mLs into the muscle once as needed for anaphylaxis.    ? fluticasone (CUTIVATE) 0.005 % ointment Apply 1 application topically 2 (two) times daily as needed (rash/dry skin).    ? fluticasone (FLONASE SENSIMIST) 27.5 MCG/SPRAY nasal spray Place into the nose as needed.    ? methocarbamol (ROBAXIN) 500 MG tablet Take 1 tablet (500 mg total) by mouth every 6 (six) hours as needed for muscle spasms (pain). 60 tablet 1  ? metoprolol tartrate (LOPRESSOR) 25 MG tablet Take 25 mg by mouth 2 (two) times daily.    ? psyllium (METAMUCIL) 58.6 % powder Take 2 packets by mouth daily.    ? tamsulosin (FLOMAX) 0.4 MG CAPS capsule  Take 0.4 mg by mouth daily.    ? testosterone (ANDROGEL) 50 MG/5GM (1%) GEL Place 2.5-5 g onto the skin daily. 5mg  on Monday Wednesday Friday ?2.5mg  on Tuesday thursdsay Saturday sunday    ? acetaminophen (TYLENOL) 650 MG CR tablet Take 650 mg by mouth at bedtime as needed for pain. (Patient not taking: Reported on 04/16/2021)    ? ?No current facility-administered medications for this visit.  ? ? ?Allergies:   Amlodipine, Augmentin [amoxicillin-pot clavulanate], and Lisinopril  ? ? ?ROS:  Please see the history of present illness.   Otherwise, review of systems are positive for none.   All other systems are reviewed and negative.  ? ? ?PHYSICAL EXAM: ?VS:  BP 112/62   Pulse 64   Ht 5\' 8"  (1.727 m)   Wt 191 lb (86.6 kg)   SpO2 96%   BMI 29.04 kg/m?  , BMI Body mass index is 29.04 kg/m?.  ?GENERAL:  Well appearing ?NECK:  No jugular venous distention, waveform within normal limits, carotid upstroke brisk and symmetric, no bruits, no thyromegaly ?LUNGS:  Clear to auscultation bilaterally ?CHEST:  Unremarkable ?HEART:  PMI not displaced or sustained,S1 and S2 within normal limits, no S3, no S4, no clicks, no rubs, 3 out of 6 apical systolic murmur radiating out the aortic outflow tract, 2 out of 6 diastolic murmur heard best at the third left interspace murmurs ?ABD:  Flat, positive bowel sounds normal in frequency in pitch, no bruits, no rebound, no guarding, no midline pulsatile mass, no hepatomegaly, no splenomegaly, well-healed surgical scars ?EXT:  2 plus pulses throughout, no edema, no cyanosis no clubbing ? ?EKG:  EKG is  ordered today. ?Sinus rhythm, rate 64, right bundle branch block, left intrafascicular block, no acute ST-T wave changes. ? ?Recent Labs: ?09/05/2020: ALT 27 ?10/06/2020: BUN 24; Creatinine, Ser 1.53; Hemoglobin 14.0; Platelets 205; Potassium 4.8; Sodium 136  ? ? ?Lipid Panel ?   ?Component Value Date/Time  ? TRIG 83 03/26/2020 0807  ? ?  ? ?Wt Readings from Last 3 Encounters:  ?04/16/21 191  lb (86.6 kg)  ?10/06/20 189 lb 13.1 oz (86.1 kg)  ?09/26/20 186 lb 6.4 oz (84.6 kg)  ?  ? ? ?Other studies Reviewed: ?Additional studies/ records that were reviewed today include: Labs ?Review of the above records demonstrates:  Please see elsewhere in the note.   ? ? ?ASSESSMENT AND PLAN: ? ?ATRIAL FIB:  Daniel Aguirre has a CHA2DS2 - VASc score of 3.  He tolerates anticoagulation.  Because his first atrial fibrillation was unprovoked.  Think he still requires long-term anticoagulation.  ? ?HTN:   His blood pressure is at target.  No change in therapy.  Now at target.  He will continue the meds as listed.  ? ?STAGE III  CKD:   Creatinine was 1.53 followed by his primary provider.  ? ?AI:   He has moderate aortic insufficiency on echo in Sept 2021.  I will follow-up with an echocardiogram. ? ?ABNORMAL EKG: Reviewed discussed the symptoms that might happen with any further conduction disturbance or bradycardia arrhythmias.  No change in therapy. ? ? ?Current medicines are reviewed at length with the patient today.  The patient does not have concerns regarding medicines. ? ?The following changes have been made:  None ? ?Labs/ tests ordered today include:  None  ? ?Orders Placed This Encounter  ?Procedures  ? EKG 12-Lead  ? ECHOCARDIOGRAM COMPLETE  ? ? ? ?Disposition:   FU with me in 12 months.   ? ? ?Signed, ?Minus Breeding, MD  ?04/16/2021 1:47 PM    ?[place] ? ?

## 2021-04-16 ENCOUNTER — Ambulatory Visit: Payer: No Typology Code available for payment source | Admitting: Cardiology

## 2021-04-16 ENCOUNTER — Other Ambulatory Visit: Payer: Self-pay

## 2021-04-16 ENCOUNTER — Encounter: Payer: Self-pay | Admitting: Cardiology

## 2021-04-16 VITALS — BP 112/62 | HR 64 | Ht 68.0 in | Wt 191.0 lb

## 2021-04-16 DIAGNOSIS — I480 Paroxysmal atrial fibrillation: Secondary | ICD-10-CM | POA: Diagnosis not present

## 2021-04-16 DIAGNOSIS — N1831 Chronic kidney disease, stage 3a: Secondary | ICD-10-CM | POA: Diagnosis not present

## 2021-04-16 DIAGNOSIS — K005 Hereditary disturbances in tooth structure, not elsewhere classified: Secondary | ICD-10-CM

## 2021-04-16 NOTE — Patient Instructions (Signed)
Medication Instructions:  ?No changes ?*If you need a refill on your cardiac medications before your next appointment, please call your pharmacy* ? ? ?Lab Work: ?None ordered ?If you have labs (blood work) drawn today and your tests are completely normal, you will receive your results only by: ?MyChart Message (if you have MyChart) OR ?A paper copy in the mail ?If you have any lab test that is abnormal or we need to change your treatment, we will call you to review the results. ? ? ?Testing/Procedures: ?Your physician has requested that you have an echocardiogram. Echocardiography is a painless test that uses sound waves to create images of your heart. It provides your doctor with information about the size and shape of your heart and how well your heart?s chambers and valves are working. You may receive an ultrasound enhancing agent through an IV if needed to better visualize your heart during the echo.This procedure takes approximately one hour. There are no restrictions for this procedure. This will take place at the [address].  ? ? ? ?Follow-Up: ?At [facility], you and your health needs are our priority.  As part of our continuing mission to provide you with exceptional heart care, we have created designated Provider Care Teams.  These Care Teams include your primary Cardiologist (physician) and Advanced Practice Providers (APPs -  Physician Assistants and Nurse Practitioners) who all work together to provide you with the care you need, when you need it. ? ?We recommend signing up for the patient portal called "MyChart".  Sign up information is provided on this After Visit Summary.  MyChart is used to connect with patients for Virtual Visits (Telemedicine).  Patients are able to view lab/test results, encounter notes, upcoming appointments, etc.  Non-urgent messages can be sent to your provider as well.   ?To learn more about what you can do with MyChart, go to [email or web address].    ? ?Your next appointment:   ?12 month(s) ? ?The format for your next appointment:   ?In Person ? ?Provider:   ?Rollene Rotunda, MD { ? ?

## 2021-05-01 ENCOUNTER — Ambulatory Visit (INDEPENDENT_AMBULATORY_CARE_PROVIDER_SITE_OTHER): Payer: No Typology Code available for payment source

## 2021-05-01 DIAGNOSIS — I480 Paroxysmal atrial fibrillation: Secondary | ICD-10-CM

## 2021-05-01 LAB — ECHOCARDIOGRAM COMPLETE
AR max vel: 3.09 cm2
AV Area VTI: 3.29 cm2
AV Area mean vel: 3 cm2
AV Mean grad: 6 mmHg
AV Peak grad: 11 mmHg
AV Vena cont: 0.93 cm
Ao pk vel: 1.66 m/s
Area-P 1/2: 3.34 cm2
Calc EF: 61.4 %
P 1/2 time: 671 msec
S' Lateral: 2.48 cm
Single Plane A2C EF: 56.1 %
Single Plane A4C EF: 67.5 %

## 2021-07-31 IMAGING — CR DG CHEST 2V
2 series · 2 of 2 positions shown · non-contrast
Comparison: 12/20/2017

CLINICAL DATA: Chest pain

EXAM:
CHEST - 2 VIEW

[chest pa]
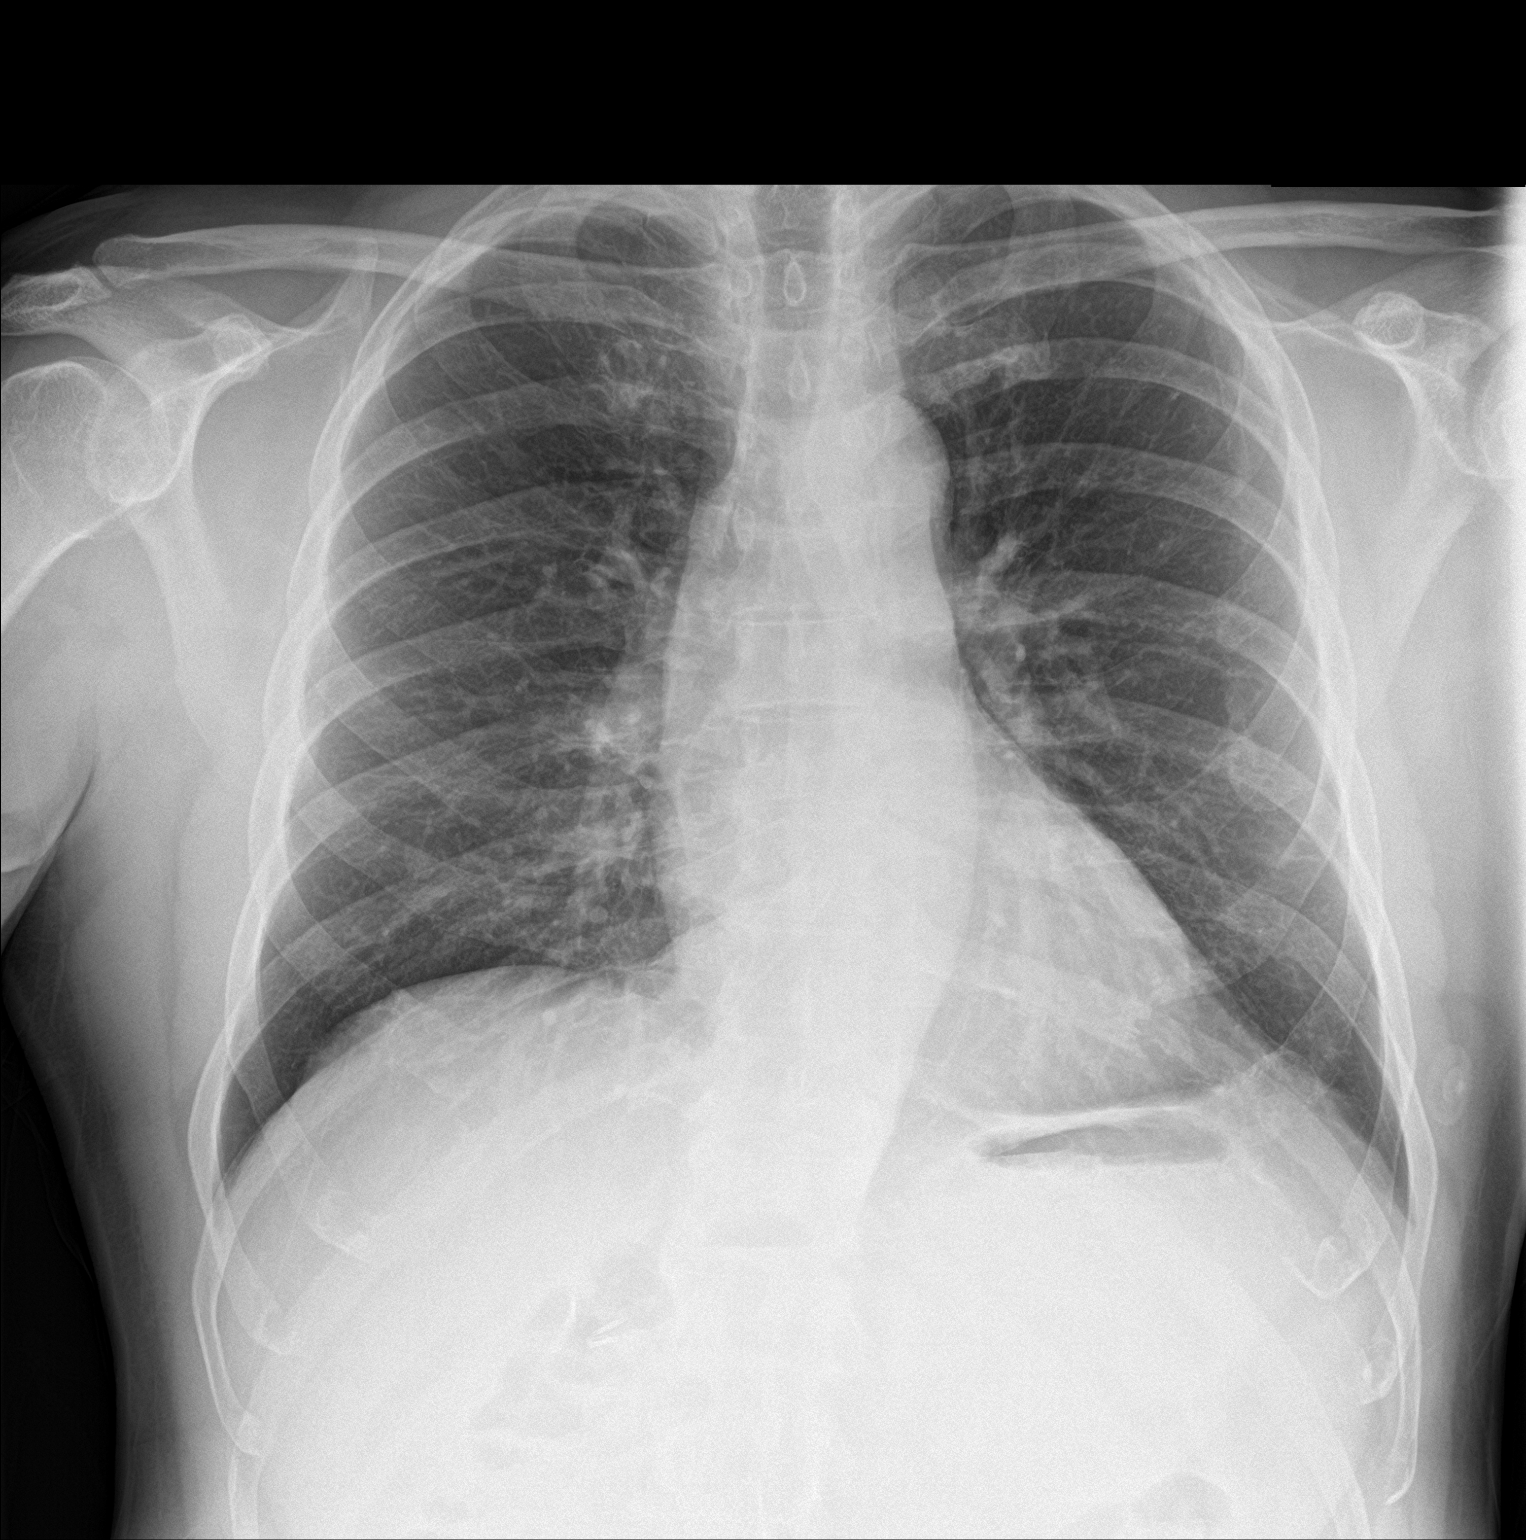

[chest lat]
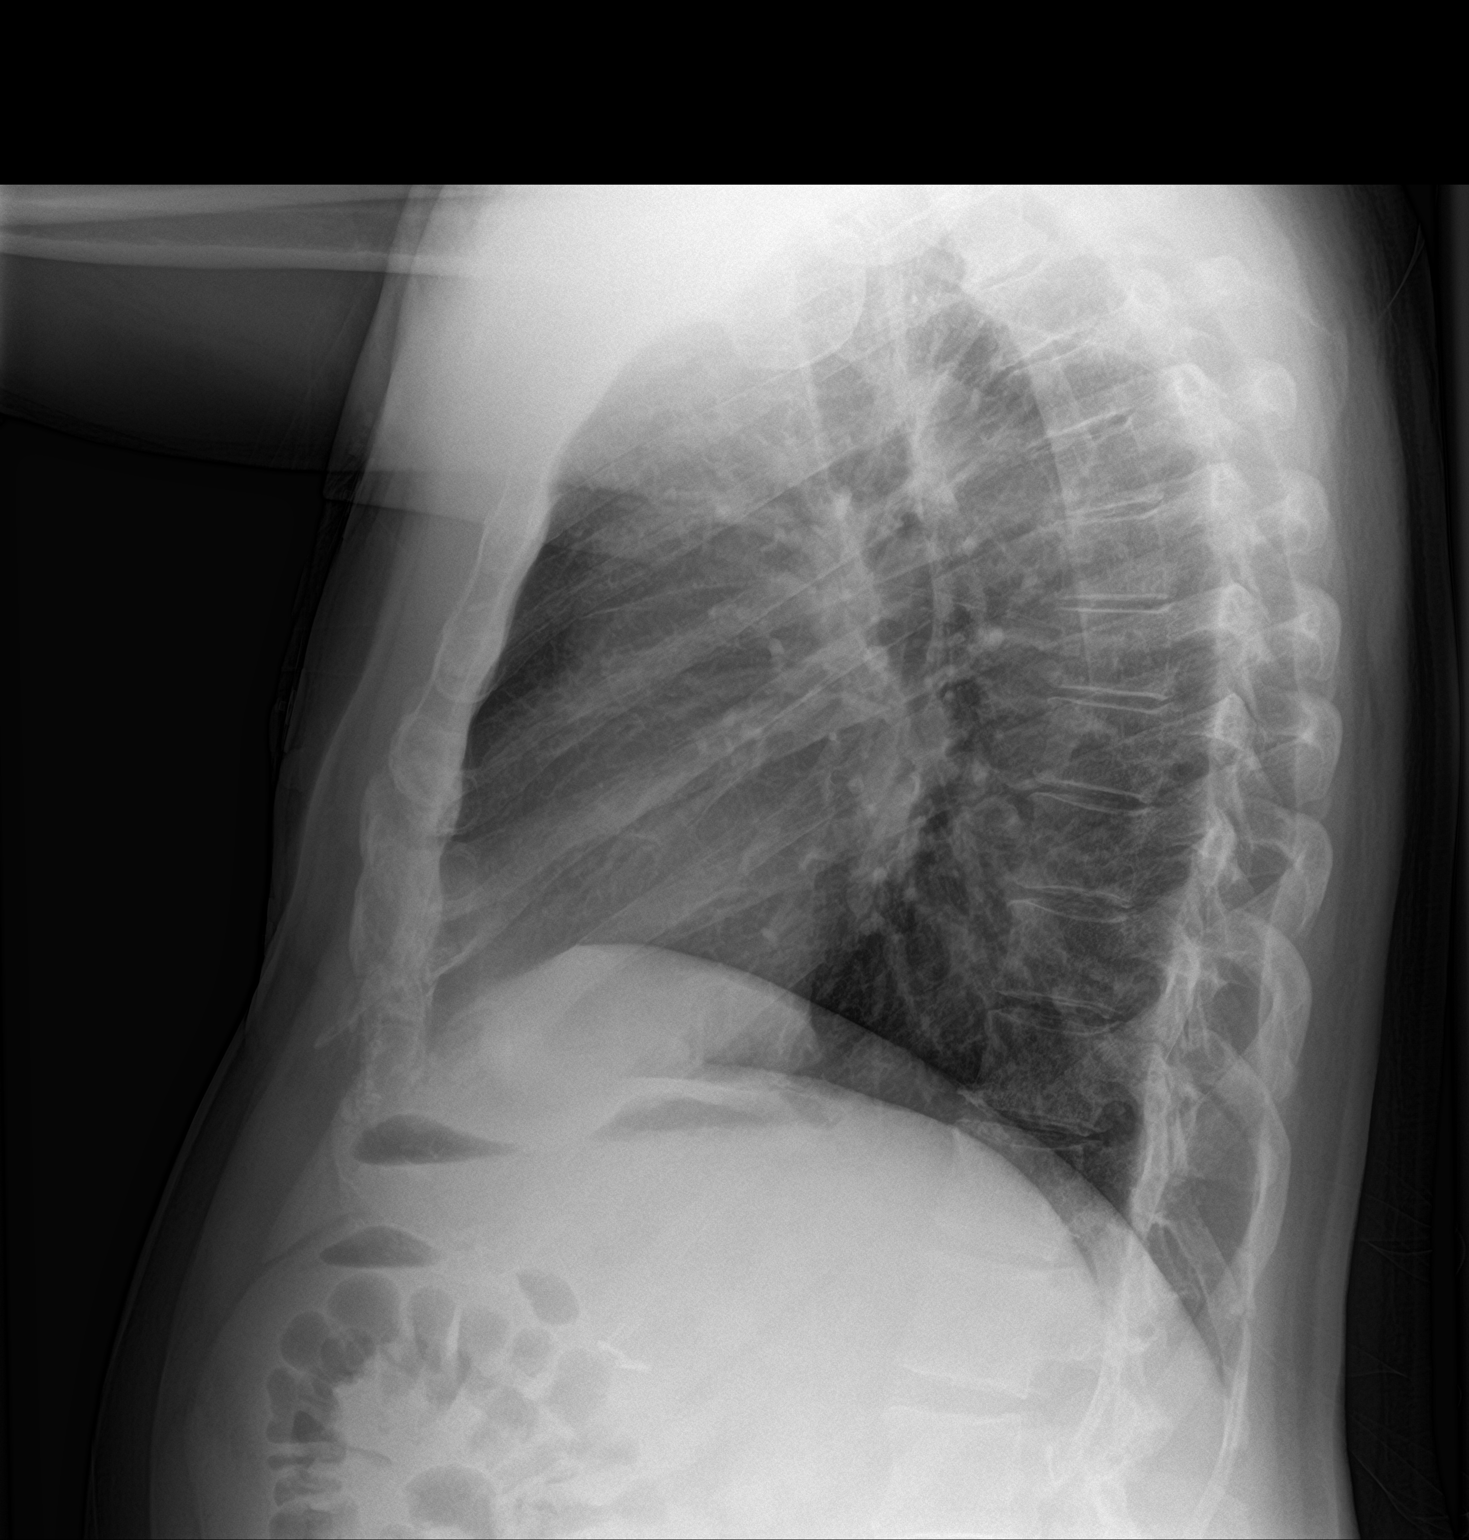

[2 of 2 positions shown; findings below may reference images not displayed]

FINDINGS: The heart size and mediastinal contours are within normal limits.
Both lungs are clear. The visualized skeletal structures are
unremarkable.
IMPRESSION: No active cardiopulmonary disease.

## 2021-11-25 ENCOUNTER — Telehealth: Payer: Self-pay | Admitting: Cardiology

## 2021-11-25 NOTE — Telephone Encounter (Signed)
Spoke with pt, at his PCP appointment his bp was 150's also. He really does not check his blood pressure on a regular basis. He had an appointment for 11/8 but he has tested positive for covid, so that appointment was cancelled. He will start checking his bp once daily about 2 hours after taking his medications. He is also having a lot of issues with his knee. He will track his bp and send the next 5 readings to [place]. Pt agreed with this plan.

## 2021-11-25 NOTE — Telephone Encounter (Signed)
Pt c/o BP issue: STAT if pt c/o blurred vision, one-sided weakness or slurred speech  1. What are your last 5 BP readings? 150/90 HR 94  2. Are you having any other symptoms (ex. Dizziness, headache, blurred vision, passed out)? Dizziness, fatigue, no appetite,   3. What is your BP issue?     Made appt for 11/8

## 2021-11-27 ENCOUNTER — Ambulatory Visit: Payer: No Typology Code available for payment source | Admitting: Nurse Practitioner

## 2021-12-16 ENCOUNTER — Encounter: Payer: Self-pay | Admitting: Cardiology

## 2021-12-17 NOTE — Progress Notes (Unsigned)
Cardiology Office Note   Date:  12/18/2021   ID:  Daniel Aguirre, DOB [birth year], MRN [id number]  PCP:  Daisy Floro, MD  Cardiologist:   Rollene Rotunda, MD   Chief Complaint  Patient presents with   Palpitations      History of Present Illness: Daniel Aguirre is a 71 y.o. male who presents for evaluation of atrial fib.  He had this initially in 2019.  He was hospitalized at that time.  He had some elevated troponin but a follow-up negative perfusion study.  He did have some probably moderate aortic insufficiency with an eccentric jet.  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  He has some renal insufficiency.  Since I last saw him he had an episode of his heart rate going up his blood pressure going up and he felt lethargic.  He did a COVID test and found that it was positive.  He has been scheduling follow-up with me because his blood pressure does run high occasionally.  He does feel some increased heart rates occasionally in his device will tell him undetermined rhythm.  He does not think it is atrial fibrillation.  It is short-lived.  He has not had any new presyncope or syncope.  He denies chest pressure, neck or arm discomfort.  He has had no new shortness of breath, PND or orthopnea.  He stays active.  Of note he did have 1 dark stool at the beginning of November.  Past Medical History:  Diagnosis Date   Chronic kidney disease with active medical management without dialysis, stage 3 (moderate) (HCC) 12/20/2017   CKD (chronic kidney disease), stage III (HCC)    Diastolic dysfunction    a. 09/2015 Echo: EF 65-70%, no rwma, Gr1 DD, Mod AI. Asc AO 67mm. Mildly dil Asc Ao.  LA 75mm.   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 09/2015 Echo: Mod AI.    Right bundle branch block    Testicular hypofunction     Past Surgical History:  Procedure Laterality Date   CHOLECYSTECTOMY     COLOSTOMY N/A 03/23/2020   Procedure: COLOSTOMY;  Surgeon: Harriette Bouillon, MD;  Location: [facility];  Service: General;  Laterality: N/A;   FLEXIBLE SIGMOIDOSCOPY N/A 10/05/2020   Procedure: FLEXIBLE SIGMOIDOSCOPY;  Surgeon: Andria Meuse, MD;  Location: [facility];  Service: General;  Laterality: N/A;   LAPAROTOMY N/A 03/23/2020   Procedure: EXPLORATORY LAPAROTOMY;  Surgeon: Harriette Bouillon, MD;  Location: [facility];  Service: General;  Laterality: N/A;   MOHS SURGERY     PARTIAL COLECTOMY N/A 03/23/2020   Procedure: PARTIAL COLECTOMY;  Surgeon: Harriette Bouillon, MD;  Location: [facility];  Service: General;  Laterality: N/A;   XI ROBOTIC ASSISTED COLOSTOMY TAKEDOWN N/A 10/05/2020   Procedure: XI ROBOTIC ASSISTED COLOSTOMY TAKEDOWN WITH LOW ANTERIOR RESECTION, LYSIS OF ADHESIONS;  Surgeon: Andria Meuse, MD;  Location: [facility];  Service: General;  Laterality: N/A;     Current Outpatient Medications  Medication Sig Dispense Refill   acetaminophen (TYLENOL) 325 MG tablet Take 2 tablets (650 mg total) by mouth every 6 (six) hours as needed for mild pain or fever.     acetaminophen (TYLENOL) 650 MG CR tablet Take 650 mg by mouth at bedtime as needed for pain.  Cholecalciferol (VITAMIN D3) 125 MCG (5000 UT) CAPS Take 5,000 Units by mouth daily.     ELIQUIS 5 MG TABS tablet TAKE 1 TABLET BY MOUTH TWICE A DAY (Patient taking differently: Take 5 mg by mouth 2 (two) times daily.) 60 tablet 2   EPINEPHrine 0.3 mg/0.3 mL IJ SOAJ injection Inject 0.3 mLs into the muscle once as needed for anaphylaxis.     fluticasone (CUTIVATE) 0.005 % ointment Apply 1 application topically 2 (two) times daily as needed (rash/dry skin).     fluticasone (FLONASE SENSIMIST) 27.5 MCG/SPRAY nasal spray Place into the nose as needed.     methocarbamol (ROBAXIN) 500 MG tablet Take 1 tablet (500  mg total) by mouth every 6 (six) hours as needed for muscle spasms (pain). 60 tablet 1   metoprolol tartrate (LOPRESSOR) 25 MG tablet Take 25 mg by mouth 2 (two) times daily.     psyllium (METAMUCIL) 58.6 % powder Take 2 packets by mouth daily.     tamsulosin (FLOMAX) 0.4 MG CAPS capsule Take 0.4 mg by mouth daily.     testosterone (ANDROGEL) 50 MG/5GM (1%) GEL Place 2.5-5 g onto the skin daily. 5mg  on Monday Wednesday Friday 2.5mg  on Tuesday thursdsay Saturday sunday     diltiazem (CARDIZEM CD) 300 MG 24 hr capsule Take 1 capsule (300 mg total) by mouth daily. 90 capsule 3   No current facility-administered medications for this visit.    Allergies:   Amlodipine, Augmentin [amoxicillin-pot clavulanate], and Lisinopril    ROS:  Please see the history of present illness.   Otherwise, review of systems are positive for none.   All other systems are reviewed and negative.    PHYSICAL EXAM: VS:  BP 130/78   Pulse 68   Ht 5\' 8"  (1.727 m)   Wt 185 lb 6.4 oz (84.1 kg)   SpO2 97%   BMI 28.19 kg/m  , BMI Body mass index is 28.19 kg/m.  GENERAL:  Well appearing NECK:  No jugular venous distention, waveform within normal limits, carotid upstroke brisk and symmetric, no bruits, no thyromegaly LUNGS:  Clear to auscultation bilaterally CHEST:  Unremarkable HEART:  PMI not displaced or sustained,S1 and S2 within normal limits, no S3, no S4, no clicks, no rubs, 2 out of 6 apical systolic murmur radiating slightly at the aortic outflow tract, no diastolic murmurs ABD:  Flat, positive bowel sounds normal in frequency in pitch, no bruits, no rebound, no guarding, no midline pulsatile mass, no hepatomegaly, no splenomegaly, well-healed abdominal scars EXT:  2 plus pulses throughout, no edema, no cyanosis no clubbing   EKG:  EKG is  ordered today. Sinus rhythm, rate 68, right bundle branch block, left intrafascicular block, no acute ST-T wave changes.  Recent Labs: No results found for requested  labs within last 365 days.    Lipid Panel    Component Value Date/Time   TRIG 83 03/26/2020 0807      Wt Readings from Last 3 Encounters:  12/18/21 185 lb 6.4 oz (84.1 kg)  04/16/21 191 lb (86.6 kg)  10/06/20 189 lb 13.1 oz (86.1 kg)      Other studies Reviewed: Additional studies/ records that were reviewed today include: Labs Review of the above records demonstrates:  Please see elsewhere in the note.     ASSESSMENT AND PLAN:  ATRIAL FIB:  Mr. AKRAM KISSICK has a CHA2DS2 - VASc score of 3.  He seems to tolerate anticoagulation but he did have a dark stool.  I will check CBC.   HTN:   His blood pressure is mildly elevated and I will increase his Cardizem to 300 mg daily.  STAGE III CKD:   Creatinine was 1.63 which is relatively unchanged.  AI:   He has moderate aortic insufficiency on echo in April of this year.  I will repeat an echo in April.   ABNORMAL EKG:   He has had no bradycardic symptoms.  No change in therapy.  PALPITATIONS: I will check a TSH, BMET and  magnesium given these palpitations.  He does have a device that he can use to send me some rhythm strips.  Current medicines are reviewed at length with the patient today.  The patient does not have concerns regarding medicines.  The following changes have been made:  As above  Labs/ tests ordered today include:     Orders Placed This Encounter  Procedures   TSH   CBC   Basic Metabolic Panel (BMET)   Magnesium   EKG 12-Lead   ECHOCARDIOGRAM COMPLETE     Disposition:   FU with me in 6 months.     Signed, Rollene Rotunda, MD  12/18/2021 4:17 PM    [facility]

## 2021-12-18 ENCOUNTER — Ambulatory Visit: Payer: No Typology Code available for payment source | Attending: Cardiology | Admitting: Cardiology

## 2021-12-18 ENCOUNTER — Encounter: Payer: Self-pay | Admitting: Cardiology

## 2021-12-18 VITALS — BP 130/78 | HR 68 | Ht 68.0 in | Wt 185.4 lb

## 2021-12-18 DIAGNOSIS — I351 Nonrheumatic aortic (valve) insufficiency: Secondary | ICD-10-CM | POA: Diagnosis not present

## 2021-12-18 DIAGNOSIS — I480 Paroxysmal atrial fibrillation: Secondary | ICD-10-CM

## 2021-12-18 DIAGNOSIS — N1831 Chronic kidney disease, stage 3a: Secondary | ICD-10-CM

## 2021-12-18 DIAGNOSIS — I10 Essential (primary) hypertension: Secondary | ICD-10-CM

## 2021-12-18 DIAGNOSIS — R9431 Abnormal electrocardiogram [ECG] [EKG]: Secondary | ICD-10-CM

## 2021-12-18 MED ORDER — DILTIAZEM HCL ER COATED BEADS 300 MG PO CP24: 300.0000 mg | ORAL_CAPSULE | Freq: Every day | ORAL | 3 refills | Status: DC

## 2021-12-18 NOTE — Patient Instructions (Signed)
Medication Instructions:   INCREASE DILTIAZEM TO 300 MG ONCE DAILY  *If you need a refill on your cardiac medications before your next appointment, please call your pharmacy*  Testing/Procedures:  Your physician has requested that you have an echocardiogram. Echocardiography is a painless test that uses sound waves to create images of your heart. It provides your doctor with information about the size and shape of your heart and how well your heart's chambers and valves are working. This procedure takes approximately one hour. There are no restrictions for this procedure. Please do NOT wear cologne, perfume, aftershave, or lotions (deodorant is allowed). Please arrive 15 minutes prior to your appointment time. [address]-SCHEDULE IN APRIL 2024   Follow-Up: At [facility], you and your health needs are our priority.  As part of our continuing mission to provide you with exceptional heart care, we have created designated Provider Care Teams.  These Care Teams include your primary Cardiologist (physician) and Advanced Practice Providers (APPs -  Physician Assistants and Nurse Practitioners) who all work together to provide you with the care you need, when you need it.  We recommend signing up for the patient portal called "MyChart".  Sign up information is provided on this After Visit Summary.  MyChart is used to connect with patients for Virtual Visits (Telemedicine).  Patients are able to view lab/test results, encounter notes, upcoming appointments, etc.  Non-urgent messages can be sent to your provider as well.   To learn more about what you can do with MyChart, go to [email or web address].    Your next appointment:    APRIL AFTER ECHO WITH DR [facility]

## 2021-12-19 LAB — TSH: TSH: 1.72 u[IU]/mL (ref 0.450–4.500)

## 2021-12-19 LAB — BASIC METABOLIC PANEL
BUN/Creatinine Ratio: 12 (ref 10–24)
BUN: 20 mg/dL (ref 8–27)
CO2: 26 mmol/L (ref 20–29)
Calcium: 9.4 mg/dL (ref 8.6–10.2)
Chloride: 100 mmol/L (ref 96–106)
Creatinine, Ser: 1.66 mg/dL — ABNORMAL HIGH (ref 0.76–1.27)
Glucose: 93 mg/dL (ref 70–99)
Potassium: 4.8 mmol/L (ref 3.5–5.2)
Sodium: 139 mmol/L (ref 134–144)
eGFR: 44 mL/min/{1.73_m2} — ABNORMAL LOW (ref >59)

## 2021-12-19 LAB — CBC
Hematocrit: 49.4 % (ref 37.5–51.0)
Hemoglobin: 16.8 g/dL (ref 13.0–17.7)
MCH: 31.7 pg (ref 26.6–33.0)
MCHC: 34 g/dL (ref 31.5–35.7)
MCV: 93 fL (ref 79–97)
Platelets: 219 10*3/uL (ref 150–450)
RBC: 5.3 x10E6/uL (ref 4.14–5.80)
RDW: 13.1 % (ref 11.6–15.4)
WBC: 6.8 10*3/uL (ref 3.4–10.8)

## 2021-12-19 LAB — MAGNESIUM: Magnesium: 2.2 mg/dL (ref 1.6–2.3)

## 2021-12-23 ENCOUNTER — Ambulatory Visit: Payer: No Typology Code available for payment source | Admitting: Cardiology

## 2022-01-08 ENCOUNTER — Telehealth: Payer: Self-pay

## 2022-01-08 NOTE — Telephone Encounter (Signed)
Clearance form does not mention it but patient is on Eliquis for history of atrial fibrillation. Pharmacy, can you please comment on how long Eliquis can be held for upcoming procedure?  Thank you!

## 2022-01-08 NOTE — Telephone Encounter (Signed)
Patient with diagnosis of afib on Eliquis for anticoagulation.    Procedure: Right knee arthroscopy Date of procedure: 02/12/22   CHA2DS2-VASc Score = 2   This indicates a 2.2% annual risk of stroke. The patient's score is based upon: CHF History: 0 HTN History: 1 Diabetes History: 0 Stroke History: 0 Vascular Disease History: 0 Age Score: 1 Gender Score: 0      CrCl 44 ml/min Platelet count 219  Per office protocol, patient can hold Eliquis for 3 days prior to procedure.    **This guidance is not considered finalized until pre-operative APP has relayed final recommendations.**

## 2022-01-08 NOTE — Telephone Encounter (Signed)
   Pre-operative Risk Assessment    Patient Name: Daniel Aguirre  DOB: 08/01/1950 MRN: [id number]      Request for Surgical Clearance    Procedure:   Right Knee Arthroscopy  Date of Surgery:  Clearance 02/12/22                                 Surgeon:  Marcene Corning, MD Surgeon's Group or Practice Name:  [facility]  Phone number:  [phone] Fax number:  [phone]   Type of Clearance Requested:   - Medical    Type of Anesthesia:   Choice   Additional requests/questions:   Also requesting Last OV notes  Signed, Ardith Dark   01/08/2022, 1:16 PM

## 2022-01-09 ENCOUNTER — Telehealth: Payer: Self-pay | Admitting: *Deleted

## 2022-01-09 NOTE — Telephone Encounter (Signed)
Pt has been scheduled for a tele visit, 01/15/22 3:00, consent on file, medicaitions reconciled.     Patient Consent for Virtual Visit        Daniel Aguirre has provided verbal consent on 01/09/2022 for a virtual visit (video or telephone).   CONSENT FOR VIRTUAL VISIT FOR:  Daniel Aguirre  By participating in this virtual visit I agree to the following:  I hereby voluntarily request, consent and authorize [facility] and its employed or contracted physicians, physician assistants, nurse practitioners or other licensed health care professionals (the Practitioner), to provide me with telemedicine health care services (the "Services") as deemed necessary by the treating Practitioner. I acknowledge and consent to receive the Services by the Practitioner via telemedicine. I understand that the telemedicine visit will involve communicating with the Practitioner through live audiovisual communication technology and the disclosure of certain medical information by electronic transmission. I acknowledge that I have been given the opportunity to request an in-person assessment or other available alternative prior to the telemedicine visit and am voluntarily participating in the telemedicine visit.  I understand that I have the right to withhold or withdraw my consent to the use of telemedicine in the course of my care at any time, without affecting my right to future care or treatment, and that the Practitioner or I may terminate the telemedicine visit at any time. I understand that I have the right to inspect all information obtained and/or recorded in the course of the telemedicine visit and may receive copies of available information for a reasonable fee.  I understand that some of the potential risks of receiving the Services via telemedicine include:  Delay or interruption in medical evaluation due to technological equipment failure or disruption; Information transmitted may not be  sufficient (e.g. poor resolution of images) to allow for appropriate medical decision making by the Practitioner; and/or  In rare instances, security protocols could fail, causing a breach of personal health information.  Furthermore, I acknowledge that it is my responsibility to provide information about my medical history, conditions and care that is complete and accurate to the best of my ability. I acknowledge that Practitioner's advice, recommendations, and/or decision may be based on factors not within their control, such as incomplete or inaccurate data provided by me or distortions of diagnostic images or specimens that may result from electronic transmissions. I understand that the practice of medicine is not an exact science and that Practitioner makes no warranties or guarantees regarding treatment outcomes. I acknowledge that a copy of this consent can be made available to me via my patient portal [facility] MyChart), or I can request a printed copy by calling the office of [facility].    I understand that my insurance will be billed for this visit.   I have read or had this consent read to me. I understand the contents of this consent, which adequately explains the benefits and risks of the Services being provided via telemedicine.  I have been provided ample opportunity to ask questions regarding this consent and the Services and have had my questions answered to my satisfaction. I give my informed consent for the services to be provided through the use of telemedicine in my medical care

## 2022-01-09 NOTE — Telephone Encounter (Signed)
   Name: Daniel Aguirre  DOB: 06/03/1950  MRN: [id number]  Primary Cardiologist: Rollene Rotunda, MD   Preoperative team, please contact this patient and set up a phone call appointment for further preoperative risk assessment. Please obtain consent and complete medication review. Thank you for your help.  Patient recently seen by Dr. Antoine Poche 12/18/2021.  Medication was adjusted and follow-up echocardiogram was ordered.  Will do phone evaluation to assess patient's current cardiac status.  I confirm that guidance regarding antiplatelet and oral anticoagulation therapy has been completed and, if necessary, noted below.  Patient with diagnosis of afib on Eliquis for anticoagulation.     Procedure: Right knee arthroscopy Date of procedure: 02/12/22     CHA2DS2-VASc Score = 2   This indicates a 2.2% annual risk of stroke. The patient's score is based upon: CHF History: 0 HTN History: 1 Diabetes History: 0 Stroke History: 0 Vascular Disease History: 0 Age Score: 1 Gender Score: 0       CrCl 44 ml/min Platelet count 219   Per office protocol, patient can hold Eliquis for 3 days prior to procedure.   Ronney Asters, NP 01/09/2022, 10:53 AM [facility]

## 2022-01-09 NOTE — Telephone Encounter (Signed)
Pt has been scheduled for a tele visit, 01/15/22 3:00.  Consent on file / medications reconciled.

## 2022-01-15 ENCOUNTER — Encounter: Payer: Self-pay | Admitting: Cardiology

## 2022-01-15 ENCOUNTER — Ambulatory Visit: Payer: No Typology Code available for payment source | Attending: Cardiovascular Disease | Admitting: Cardiology

## 2022-01-15 DIAGNOSIS — Z01810 Encounter for preprocedural cardiovascular examination: Secondary | ICD-10-CM

## 2022-01-15 NOTE — Progress Notes (Signed)
Virtual Visit via Telephone Note   Because of Daniel Aguirre's co-morbid illnesses, he is at least at moderate risk for complications without adequate follow up.  This format is felt to be most appropriate for this patient at this time.  The patient did not have access to video technology/had technical difficulties with video requiring transitioning to audio format only (telephone).  All issues noted in this document were discussed and addressed.  No physical exam could be performed with this format.  Please refer to the patient's chart for his consent to telehealth for [facility].  Evaluation Performed:  Preoperative cardiovascular risk assessment _____________   Date:  01/15/2022   Patient ID:  Daniel Aguirre, DOB [birth year], MRN [id number] Patient Location:  Home Provider location:   Office  Primary Care Provider:  Daisy Floro, MD Primary Cardiologist:  Rollene Rotunda, MD  Chief Complaint / Patient Profile   71 y.o. y/o male with a h/o diastolic dysfunction, AF (on Eliquis), CKD III, moderate AI, who is pending right knee arthroscopy with Dr. Jerl Santos on 02/12/22, and presents today for telephonic preoperative cardiovascular risk assessment.  History of Present Illness    Daniel Aguirre is a 71 y.o. male who presents via audio conferencing for a telehealth visit today.  Pt was last seen in cardiology clinic on 12/18/21 by Dr. Antoine Poche.  At that time Daniel Aguirre was doing relatively well from a cardiac standpoint, his BP was elevated and he stated that he had an episode of feeling like his heart was racing. His cardizem was in creased to 300 mg daily.  The patient is now pending procedure as outlined above. Since his last visit, he reports he is doing well. His BP is now consistently 120-130's/70's. He denies chest pain, palpitations, dyspnea, pnd, orthopnea, n, v, dizziness, syncope, edema, weight gain, or early satiety.   Past Medical History     Past Medical History:  Diagnosis Date   Chronic kidney disease with active medical management without dialysis, stage 3 (moderate) (HCC) 12/20/2017   CKD (chronic kidney disease), stage III (HCC)    Diastolic dysfunction    a. 09/2015 Echo: EF 65-70%, no rwma, Gr1 DD, Mod AI. Asc AO 7mm. Mildly dil Asc Ao.  LA 4mm.   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 09/2015 Echo: Mod AI.   Right bundle branch block    Testicular hypofunction    Past Surgical History:  Procedure Laterality Date   CHOLECYSTECTOMY     COLOSTOMY N/A 03/23/2020   Procedure: COLOSTOMY;  Surgeon: Harriette Bouillon, MD;  Location: [facility];  Service: General;  Laterality: N/A;   FLEXIBLE SIGMOIDOSCOPY N/A 10/05/2020   Procedure: FLEXIBLE SIGMOIDOSCOPY;  Surgeon: Andria Meuse, MD;  Location: [facility];  Service: General;  Laterality: N/A;   LAPAROTOMY N/A 03/23/2020   Procedure: EXPLORATORY LAPAROTOMY;  Surgeon: Harriette Bouillon, MD;  Location: [facility];  Service: General;  Laterality: N/A;   MOHS SURGERY     PARTIAL COLECTOMY N/A 03/23/2020   Procedure: PARTIAL COLECTOMY;  Surgeon: Harriette Bouillon, MD;  Location: [facility];  Service: General;  Laterality: N/A;   XI ROBOTIC ASSISTED COLOSTOMY TAKEDOWN N/A 10/05/2020   Procedure: XI ROBOTIC ASSISTED COLOSTOMY TAKEDOWN WITH LOW ANTERIOR RESECTION, LYSIS OF ADHESIONS;  Surgeon: Andria Meuse, MD;  Location:  [facility];  Service: General;  Laterality: N/A;    Allergies  Allergies  Allergen Reactions   Amlodipine Palpitations   Augmentin [Amoxicillin-Pot Clavulanate] Rash    Rash developed in 2022- Patient initially tolerated ~ 4 weeks of therapy. When he re-started augmentin at a later date, he developed a rash about 1 week into therapy. No shortness of breath or swelling.   Lisinopril Other  (See Comments)    Elevated  creatinine     Home Medications    Prior to Admission medications   Medication Sig Start Date End Date Taking? Authorizing Provider  acetaminophen (TYLENOL) 650 MG CR tablet Take 650 mg by mouth at bedtime as needed for pain.    [provider]  Cholecalciferol (VITAMIN D3) 125 MCG (5000 UT) CAPS Take 5,000 Units by mouth daily. 04/21/19   [provider]  diltiazem (CARDIZEM CD) 300 MG 24 hr capsule Take 1 capsule (300 mg total) by mouth daily. 12/18/21   Rollene Rotunda, MD  ELIQUIS 5 MG TABS tablet TAKE 1 TABLET BY MOUTH TWICE A DAY Patient taking differently: Take 5 mg by mouth 2 (two) times daily. 04/12/18   Rollene Rotunda, MD  EPINEPHrine 0.3 mg/0.3 mL IJ SOAJ injection Inject 0.3 mLs into the muscle once as needed for anaphylaxis. 03/18/20   [provider]  fluticasone (CUTIVATE) 0.005 % ointment Apply 1 application topically 2 (two) times daily as needed (rash/dry skin).    [provider]  fluticasone (FLONASE SENSIMIST) 27.5 MCG/SPRAY nasal spray Place into the nose as needed. 04/12/21   [provider]  methocarbamol (ROBAXIN) 500 MG tablet Take 1 tablet (500 mg total) by mouth every 6 (six) hours as needed for muscle spasms (pain). 04/02/20   Juliet Rude, PA-C  metoprolol tartrate (LOPRESSOR) 25 MG tablet Take 25 mg by mouth 2 (two) times daily.    [provider]  psyllium (METAMUCIL) 58.6 % powder Take 2 packets by mouth daily.    [provider]  tamsulosin (FLOMAX) 0.4 MG CAPS capsule Take 0.4 mg by mouth daily. 10/04/19   [provider]  testosterone (ANDROGEL) 50 MG/5GM (1%) GEL Place 2.5-5 g onto the skin daily. 5mg  on Monday Wednesday Friday 2.5mg  on Tuesday thursdsay Saturday sunday 04/08/18   [provider]  traMADol (ULTRAM) 50 MG tablet Take 50 mg by mouth every 6 (six) hours as needed for moderate pain.    [provider]    Physical Exam    Vital  Signs:  Daniel Aguirre does not have vital signs available for review today.  Given telephonic nature of communication, physical exam is limited. AAOx3. NAD. Normal affect.  Speech and respirations are unlabored.  Accessory Clinical Findings    None  Assessment & Plan    1.  Preoperative Cardiovascular Risk Assessment:  According to the Revised Cardiac Risk Index (RCRI), his Perioperative Risk of Major Cardiac Event is (%): 0.4  His Functional Capacity in METs is: 8.97 according to the [employer] Activity Status Index (DASI).   Therefore, based on ACC/AHA guidelines, patient would be at acceptable risk for the planned procedure without further cardiovascular testing. I will route this recommendation to the requesting party via Epic fax function.   The patient was advised that if he develops new symptoms prior to surgery to contact our office to arrange for a follow-up visit, and he verbalized understanding.  Per office protocol, patient can hold Eliquis for 3 days prior to procedure.   A copy  of this note will be routed to requesting surgeon.  Time:   Today, I have spent 12 minutes with the patient with telehealth technology discussing medical history, symptoms, and management plan.     Flossie Dibble, NP  01/15/2022, 3:10 PM

## 2022-04-25 ENCOUNTER — Ambulatory Visit (INDEPENDENT_AMBULATORY_CARE_PROVIDER_SITE_OTHER): Payer: No Typology Code available for payment source

## 2022-04-25 DIAGNOSIS — I080 Rheumatic disorders of both mitral and aortic valves: Secondary | ICD-10-CM | POA: Diagnosis not present

## 2022-04-25 DIAGNOSIS — I480 Paroxysmal atrial fibrillation: Secondary | ICD-10-CM | POA: Diagnosis not present

## 2022-04-25 DIAGNOSIS — I351 Nonrheumatic aortic (valve) insufficiency: Secondary | ICD-10-CM

## 2022-04-25 DIAGNOSIS — N1831 Chronic kidney disease, stage 3a: Secondary | ICD-10-CM

## 2022-04-25 DIAGNOSIS — R9431 Abnormal electrocardiogram [ECG] [EKG]: Secondary | ICD-10-CM

## 2022-04-25 DIAGNOSIS — I5030 Unspecified diastolic (congestive) heart failure: Secondary | ICD-10-CM | POA: Diagnosis not present

## 2022-04-25 DIAGNOSIS — I10 Essential (primary) hypertension: Secondary | ICD-10-CM | POA: Diagnosis not present

## 2022-04-25 LAB — ECHOCARDIOGRAM COMPLETE
AR max vel: 3.6 cm2
AV Area VTI: 3.88 cm2
AV Area mean vel: 3.84 cm2
AV Mean grad: 5 mmHg
AV Peak grad: 10 mmHg
AV Vena cont: 0.69 cm
Ao pk vel: 1.58 m/s
Area-P 1/2: 3.72 cm2
Calc EF: 63.7 %
P 1/2 time: 507 msec
S' Lateral: 2.6 cm
Single Plane A2C EF: 60.9 %
Single Plane A4C EF: 63.9 %

## 2022-04-28 ENCOUNTER — Encounter: Payer: Self-pay | Admitting: Cardiology

## 2022-04-28 NOTE — Progress Notes (Addendum)
  Cardiology Office Note:   Date:  05/02/2022  ID:  [name], DOB 11-05-1950, MRN [id number]  History of Present Illness:   Daniel Aguirre is a 72 y.o. male  who presents for evaluation of atrial fib.  He had this initially in 2019.  He was hospitalized at that time.  He had some elevated troponin but a follow-up negative perfusion study.  He did have some probably moderate aortic insufficiency with an eccentric jet.  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  He has some renal insufficiency.  Since I last saw him he has done okay.  He had some knee problems and surgery. The patient denies any new symptoms such as chest discomfort, neck or arm discomfort. There has been no new shortness of breath, PND or orthopnea. There have been no reported palpitations, presyncope or syncope.  He does not feel any atrial fibrillation.   ROS: As stated in the HPI and negative for all other systems.  Studies Reviewed:    EKG: Sinus rhythm, rate 64, left axis deviation, left anterior fascicular block, right bundle branch block  Risk Assessment/Calculations:    CHA2DS2-VASc Score = 2    This indicates a 2.2% annual risk of stroke. The patient's score is based upon: CHF History: 0 HTN History: 1 Diabetes History: 0 Stroke History: 0 Vascular Disease History: 0 Age Score: 1 Gender Score: 0       Physical Exam:   VS:  BP 124/78 (BP Location: Right Arm, Patient Position: Sitting, Cuff Size: Large)   Pulse 64   Ht  (1.727 m)   Wt 191 lb 12.8 oz (87 kg)   SpO2 94%   BMI 29.16 kg/m    Wt Readings from Last 3 Encounters:  05/02/22 191 lb 12.8 oz (87 kg)  12/18/21 185 lb 6.4 oz (84.1 kg)  04/16/21 191 lb (86.6 kg)     GEN: Well nourished, well developed in no acute distress NECK: No JVD; No carotid bruits CARDIAC: RRR, grade 2 out of 6 apical systolic murmur heard only at the left upper sternal border, no diastolic murmurs, rubs,  gallops RESPIRATORY:  Clear to auscultation without rales, wheezing or rhonchi  ABDOMEN: Soft, non-tender, non-distended EXTREMITIES:  No edema; No deformity   ASSESSMENT AND PLAN:   ATRIAL FIB:  Mr. Daniel Aguirre has a CHA2DS2 - VASc score of 3.  He tolerates anticoagulation.  He is up-to-date with blood work.  No change in therapy. Marland Kitchen    HTN:   His blood pressure is at target.  No change in therapy.     STAGE III CKD:   Creatinine was 1.66 which is unchanged   AI:   He has moderate aortic insufficiency on echo in April 2024.  I will follow this again with echo next year.   ABNORMAL EKG:   He understands his conduction defect since had no symptomatic bradycardia arrhythmias.  No change in therapy.    Signed, Rollene Rotunda, MD

## 2022-05-01 DIAGNOSIS — R002 Palpitations: Secondary | ICD-10-CM | POA: Insufficient documentation

## 2022-05-02 ENCOUNTER — Ambulatory Visit: Payer: No Typology Code available for payment source | Attending: Cardiology | Admitting: Cardiology

## 2022-05-02 ENCOUNTER — Encounter: Payer: Self-pay | Admitting: Cardiology

## 2022-05-02 VITALS — BP 124/78 | HR 64 | Ht 68.0 in | Wt 191.8 lb

## 2022-05-02 DIAGNOSIS — R002 Palpitations: Secondary | ICD-10-CM

## 2022-05-02 DIAGNOSIS — I351 Nonrheumatic aortic (valve) insufficiency: Secondary | ICD-10-CM | POA: Diagnosis not present

## 2022-05-02 DIAGNOSIS — I10 Essential (primary) hypertension: Secondary | ICD-10-CM | POA: Diagnosis not present

## 2022-05-02 DIAGNOSIS — I480 Paroxysmal atrial fibrillation: Secondary | ICD-10-CM | POA: Diagnosis not present

## 2022-05-02 DIAGNOSIS — N1831 Chronic kidney disease, stage 3a: Secondary | ICD-10-CM | POA: Diagnosis not present

## 2022-05-02 NOTE — Patient Instructions (Signed)
Medication Instructions:  No changes   *If you need a refill on your cardiac medications before your next appointment, please call your pharmacy*   Lab Work: Not needed   Testing/Procedures: Will be schedule in 12 months April 2025 ,[address] Your physician has requested that you have an echocardiogram. Echocardiography is a painless test that uses sound waves to create images of your heart. It provides your doctor with information about the size and shape of your heart and how well your heart's chambers and valves are working. This procedure takes approximately one hour. There are no restrictions for this procedure. Please do NOT wear cologne, perfume, aftershave, or lotions (deodorant is allowed). Please arrive 15 minutes prior to your appointment time.    Follow-Up: At [facility], you and your health needs are our priority.  As part of our continuing mission to provide you with exceptional heart care, we have created designated Provider Care Teams.  These Care Teams include your primary Cardiologist (physician) and Advanced Practice Providers (APPs -  Physician Assistants and Nurse Practitioners) who all work together to provide you with the care you need, when you need it.     Your next appointment:   12 month(s)  The format for your next appointment:   In Person  Provider:   Rollene Rotunda, MD    Other Instructions

## 2022-08-11 ENCOUNTER — Telehealth: Payer: Self-pay | Admitting: Cardiology

## 2022-08-11 NOTE — Telephone Encounter (Signed)
Patient c/o Palpitations:  STAT if patient reporting lightheadedness, shortness of breath, or chest pain  How long have you had palpitations/irregular HR/ Afib? Are you having the symptoms now? About 11 a.m.; yes   Are you currently experiencing lightheadedness, SOB or CP? lightheadedness  Do you have a history of afib (atrial fibrillation) or irregular heart rhythm? afib  Have you checked your BP or HR? (document readings if available): HR at 65   Are you experiencing any other symptoms? Heart flutters, irregular heartbeat

## 2022-08-11 NOTE — Telephone Encounter (Signed)
Patient states his cardio stated he was in AFIB. He is currently at work and asymptomatic. Heart currently at 65. He is taking eliquis and metoprolol as prescribed. Reassured patient to continue taking medications as prescribed and continue to monitor heart rate. Discussed ED precautions. Will forward to provided

## 2022-08-12 NOTE — Telephone Encounter (Signed)
Patient is no longer in AFIB and feels fine.

## 2022-08-12 NOTE — Telephone Encounter (Signed)
Spoke with pt, he noticed on the way home from work the fluttering stopped. He is feeling fine today.

## 2022-09-03 ENCOUNTER — Ambulatory Visit
Admission: RE | Admit: 2022-09-03 | Discharge: 2022-09-03 | Disposition: A | Discharge status: Home / Self Care | Payer: No Typology Code available for payment source | Source: Ambulatory Visit | Attending: Family Medicine | Admitting: Family Medicine

## 2022-09-03 ENCOUNTER — Other Ambulatory Visit: Payer: Self-pay | Admitting: Family Medicine

## 2022-09-03 DIAGNOSIS — M545 Low back pain, unspecified: Secondary | ICD-10-CM

## 2023-04-27 ENCOUNTER — Ambulatory Visit (INDEPENDENT_AMBULATORY_CARE_PROVIDER_SITE_OTHER): Payer: No Typology Code available for payment source

## 2023-04-27 DIAGNOSIS — I351 Nonrheumatic aortic (valve) insufficiency: Secondary | ICD-10-CM | POA: Diagnosis not present

## 2023-04-27 DIAGNOSIS — I480 Paroxysmal atrial fibrillation: Secondary | ICD-10-CM

## 2023-04-27 DIAGNOSIS — R002 Palpitations: Secondary | ICD-10-CM | POA: Diagnosis not present

## 2023-04-27 LAB — ECHOCARDIOGRAM COMPLETE
Area-P 1/2: 3.6 cm2
P 1/2 time: 507 ms
S' Lateral: 1.83 cm

## 2023-05-11 ENCOUNTER — Encounter: Payer: Self-pay | Admitting: *Deleted

## 2023-08-04 NOTE — Progress Notes (Unsigned)
 Cardiology Office Note:   Date:  08/06/2023  ID:  ASHTEN Aguirre, DOB 23-Apr-1950, MRN [id number] PCP: Okey Carlin Redbird, MD  [facility] Providers Cardiologist:  Lynwood Schilling, MD {  History of Present Illness:   Daniel Aguirre is a 73 y.o. male who presents for evaluation of atrial fib.  He had this initially in 2019.  He was hospitalized at that time.  He had some elevated troponin but a follow-up negative perfusion study.  He did have some probably moderate aortic insufficiency with an eccentric jet.  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  He has some renal insufficiency.   Since I last saw him he had some swelling with his hands and thought this might be related to treatment with cholestyramine.  However, he came off of that and his hand swelling got better but then got worse again without restarting the drug.  He had a little bit of hand and ankle swelling but he was not describing any new shortness of breath, PND or orthopnea.  He was not having any new palpitations.  He occasionally feels his heart rate going up particular when he is outside he gets hot.  He has a Crist I will check his rhythm once in a while but he has not been noting A-fib.  He does not check it routinely.  He checks his heart rate on his watch but not his EKG.  He has not had any presyncope or syncope.  He has had no new chest pressure, neck or arm discomfort.  ROS: As stated in the HPI and negative for all other systems.  Studies Reviewed:    EKG:   EKG Interpretation Date/Time:  Thursday August 06 2023 08:53:04 EDT Ventricular Rate:  66 PR Interval:  190 QRS Duration:  162 QT Interval:  398 QTC Calculation: 417 R Axis:   -56  Text Interpretation: Normal sinus rhythm Right bundle branch block Left anterior fascicular block Bifascicular block When compared with ECG of 22-Mar-2020 03:52, Vent. rate has decreased BY  42 BPM Confirmed by Schilling Lynwood ([id number])  on 08/06/2023 9:02:31 AM    Risk Assessment/Calculations:    CHA2DS2-VASc Score = 2   This indicates a 2.2% annual risk of stroke. The patient's score is based upon: CHF History: 0 HTN History: 1 Diabetes History: 0 Stroke History: 0 Vascular Disease History: 0 Age Score: 1 Gender Score: 0    Physical Exam:   VS:  BP (!) 148/77 (BP Location: Left Arm, Patient Position: Sitting)   Pulse 66   Ht 5' 8 (1.727 m)   Wt 195 lb 3.2 oz (88.5 kg)   SpO2 98%   BMI 29.68 kg/m    Wt Readings from Last 3 Encounters:  08/06/23 195 lb 3.2 oz (88.5 kg)  05/02/22 191 lb 12.8 oz (87 kg)  12/18/21 185 lb 6.4 oz (84.1 kg)     GEN: Well nourished, well developed in no acute distress NECK: No JVD; No carotid bruits CARDIAC: RRR, soft apical systolic murmur early peaking and nonradiating, no diastolic murmurs, rubs, gallops RESPIRATORY:  Clear to auscultation without rales, wheezing or rhonchi  ABDOMEN: Soft, non-tender, non-distended EXTREMITIES: Trace leg edema; No deformity   ASSESSMENT AND PLAN:   ATRIAL FIB:   The patient's had some tachypalpitations but has not recorded any atrial fibrillation.  He tolerates anticoagulation.  No change in therapy.   HTN:   His blood pressure is not at target.  I  am going to use his metoprolol  to 50 mg twice daily.  He will keep a blood pressure diary.    STAGE IIIa CKD:   Creatinine was  elevated but stable.  He follows with his primary care.  With his swelling of asked him to make sure he has had a urinalysis to look for protein.  I will defer lab follow-up to his primary.    AI:   He has moderate aortic insufficiency on echo in April 2025.  I will check this again most likely in 2027.  ABNORMAL EKG:   He has an abnormal EKG as mentioned above but no symptomatic bradycardia.  No change in therapy.  Follow up with me in one year.   Signed, Lynwood Schilling, MD

## 2023-08-06 ENCOUNTER — Ambulatory Visit: Attending: Cardiology | Admitting: Cardiology

## 2023-08-06 ENCOUNTER — Encounter: Payer: Self-pay | Admitting: Cardiology

## 2023-08-06 VITALS — BP 148/77 | HR 66 | Ht 68.0 in | Wt 195.2 lb

## 2023-08-06 DIAGNOSIS — I10 Essential (primary) hypertension: Secondary | ICD-10-CM

## 2023-08-06 DIAGNOSIS — I480 Paroxysmal atrial fibrillation: Secondary | ICD-10-CM

## 2023-08-06 DIAGNOSIS — I351 Nonrheumatic aortic (valve) insufficiency: Secondary | ICD-10-CM | POA: Diagnosis not present

## 2023-08-06 DIAGNOSIS — N1831 Chronic kidney disease, stage 3a: Secondary | ICD-10-CM | POA: Diagnosis not present

## 2023-08-06 DIAGNOSIS — R9431 Abnormal electrocardiogram [ECG] [EKG]: Secondary | ICD-10-CM

## 2023-08-06 MED ORDER — METOPROLOL TARTRATE 50 MG PO TABS: 50.0000 mg | ORAL_TABLET | Freq: Two times a day (BID) | ORAL | 3 refills | Status: AC

## 2023-08-06 NOTE — Patient Instructions (Signed)
 Medication Instructions:  Increase Metoprolol  to 50 mg twice daily *If you need a refill on your cardiac medications before your next appointment, please call your pharmacy*  Lab Work: NONE If you have labs (blood work) drawn today and your tests are completely normal, you will receive your results only by: MyChart Message (if you have MyChart) OR A paper copy in the mail If you have any lab test that is abnormal or we need to change your treatment, we will call you to review the results.  Testing/Procedures: NONE  Follow-Up: At [facility], you and your health needs are our priority.  As part of our continuing mission to provide you with exceptional heart care, our providers are all part of one team.  This team includes your primary Cardiologist (physician) and Advanced Practice Providers or APPs (Physician Assistants and Nurse Practitioners) who all work together to provide you with the care you need, when you need it.  Your next appointment:   1 year(s)  Provider:   Lynwood Schilling, MD    We recommend signing up for the patient portal called MyChart.  Sign up information is provided on this After Visit Summary.  MyChart is used to connect with patients for Virtual Visits (Telemedicine).  Patients are able to view lab/test results, encounter notes, upcoming appointments, etc.  Non-urgent messages can be sent to your provider as well.   To learn more about what you can do with MyChart, go to [email or web address].   Other Instructions Blood pressure diary: take your blood pressure twice daily for 10 days and send us  the readings via MyChart

## 2023-08-10 NOTE — Telephone Encounter (Signed)
 See other MyChart exchange from 7/17

## 2023-08-20 NOTE — Telephone Encounter (Signed)
 Left voice message to call 7/31

## 2023-08-27 NOTE — Telephone Encounter (Signed)
 Spoke with pt's wife per DPR and notified her of Dr. Denver comments on pt's labs. Wife verbalized understanding. All questions if any were answered.

## 2023-12-02 ENCOUNTER — Encounter: Payer: Self-pay | Admitting: Cardiology

## 2023-12-07 ENCOUNTER — Telehealth: Payer: Self-pay | Admitting: Cardiology

## 2023-12-07 ENCOUNTER — Other Ambulatory Visit: Payer: Self-pay

## 2023-12-07 DIAGNOSIS — R9431 Abnormal electrocardiogram [ECG] [EKG]: Secondary | ICD-10-CM

## 2023-12-07 DIAGNOSIS — I480 Paroxysmal atrial fibrillation: Secondary | ICD-10-CM

## 2023-12-07 DIAGNOSIS — N1831 Chronic kidney disease, stage 3a: Secondary | ICD-10-CM

## 2023-12-07 DIAGNOSIS — I351 Nonrheumatic aortic (valve) insufficiency: Secondary | ICD-10-CM

## 2023-12-07 DIAGNOSIS — I10 Essential (primary) hypertension: Secondary | ICD-10-CM

## 2023-12-07 MED ORDER — DILTIAZEM HCL ER COATED BEADS 300 MG PO CP24
300.0000 mg | ORAL_CAPSULE | Freq: Every day | ORAL | 2 refills | Status: AC
Start: 2023-12-07 — End: ?

## 2023-12-07 NOTE — Telephone Encounter (Signed)
 Refill sent.

## 2023-12-07 NOTE — Telephone Encounter (Signed)
 *  STAT* If patient is at the pharmacy, call can be transferred to refill team.   1. Which medications need to be refilled? (please list name of each medication and dose if known)   diltiazem  (CARDIZEM  CD) 300 MG 24 hr capsule   2. Which pharmacy/location (including street and city if local pharmacy) is medication to be sent to?  [id number] - [place], [place] - [address]    3. Do they need a 30 day or 90 day supply? 90
